# Patient Record
Sex: Female | Born: 1995 | Race: Black or African American | Hispanic: No | Marital: Single | Smoking: Never smoker
Health system: Southern US, Community
[De-identification: ages and names within clinical notes are randomized; demographics above are authoritative.]

## PROBLEM LIST (undated history)

## (undated) DIAGNOSIS — J36 Peritonsillar abscess: Secondary | ICD-10-CM

## (undated) DIAGNOSIS — Z789 Other specified health status: Secondary | ICD-10-CM

## (undated) HISTORY — PX: INCISION AND DRAINAGE OF PERITONSILLAR ABCESS: SHX6257

---
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
## 2016-06-11 ENCOUNTER — Ambulatory Visit (INDEPENDENT_AMBULATORY_CARE_PROVIDER_SITE_OTHER): Payer: Medicaid Other | Admitting: Physician Assistant

## 2016-06-11 ENCOUNTER — Encounter (INDEPENDENT_AMBULATORY_CARE_PROVIDER_SITE_OTHER): Payer: Self-pay | Admitting: Physician Assistant

## 2016-06-11 VITALS — BP 132/83 | HR 87 | Temp 98.0°F | Ht 68.0 in | Wt 281.2 lb

## 2016-06-11 DIAGNOSIS — Z0000 Encounter for general adult medical examination without abnormal findings: Secondary | ICD-10-CM | POA: Diagnosis not present

## 2016-06-11 NOTE — Progress Notes (Signed)
   Subjective:  Patient ID: Leslie [name], female    DOB: [birth year]  Age: 21 y.o. MRN: [id number]  CC:  Physical exam  HPI Leslie [name] is a 21 y.o. female with no significant PMH presents for a physical exam. She has concern for hypertension due to strong family history of hypertension. Has not had any cardiovascular symptoms but she does not exercise or diet. Declines Pap smear today and will return at another time for the PAP smear. Previous pap smears have been normal according to patient. Needs TDAP. Only ailment has been left patellar dislocation. Denies any other symptoms.    ROS Review of Systems  Constitutional: Negative for chills, fever and malaise/fatigue.  Eyes: Negative for blurred vision.  Respiratory: Negative for shortness of breath.   Cardiovascular: Negative for chest pain and palpitations.  Gastrointestinal: Negative for abdominal pain and nausea.  Genitourinary: Negative for dysuria and hematuria.  Musculoskeletal: Positive for joint pain (hx of left patellar dislocation). Negative for myalgias.  Skin: Negative for rash.  Neurological: Negative for tingling and headaches.  Psychiatric/Behavioral: Negative for depression. The patient is not nervous/anxious.     Objective:  BP 132/83   Pulse 87   Temp 98 F (36.7 C) (Oral)   Ht 5\' 8"  (1.727 m)   Wt 281 lb 3.2 oz (127.6 kg)   LMP  (Exact Date)   SpO2 95%   BMI 42.76 kg/m   BP/Weight [id number]  Systolic BP [id number]  Diastolic BP 83  Wt. (Lbs) 281.2  BMI 42.76      Physical Exam  Constitutional: She is oriented to person, place, and time.  Obese, NAD, polite  HENT:  Head: Normocephalic and atraumatic.  Eyes: Conjunctivae are normal. Pupils are equal, round, and reactive to light. No scleral icterus.  Neck: Normal range of motion. Neck supple. No thyromegaly present.  Cardiovascular: Normal rate, regular rhythm and normal heart sounds.   Pulmonary/Chest: Effort normal and breath sounds normal.  Abdominal:  Soft. Bowel sounds are normal. There is no tenderness.  Musculoskeletal: She exhibits no edema or deformity.  Patellar DTR 1+ bilaterally. Knees without crepitus or TTP.  Neurological: She is alert and oriented to person, place, and time. No cranial nerve deficit. She exhibits normal muscle tone. Coordination normal.  Skin: Skin is warm and dry. No rash noted. No erythema. No pallor.  Psychiatric: She has a normal mood and affect. Her behavior is normal. Thought content normal.  Vitals reviewed.    Assessment & Plan:   1. Physical exam -Pap smear declined today. No TDAP available in clinic today. Shipment expected in one week. Pt will follow up for Pap and TDAP will likely be administered at that time.  - CBC with Differential/Platelet - Comprehensive metabolic panel - Lipid panel   Follow-up: Return in about 2 weeks (around 06/25/2016) for PAP and TDAP.   Clent Demark PA

## 2016-06-11 NOTE — Patient Instructions (Signed)

## 2016-06-12 ENCOUNTER — Other Ambulatory Visit (INDEPENDENT_AMBULATORY_CARE_PROVIDER_SITE_OTHER): Payer: Self-pay | Admitting: Physician Assistant

## 2016-06-12 DIAGNOSIS — E7800 Pure hypercholesterolemia, unspecified: Secondary | ICD-10-CM

## 2016-06-12 LAB — COMPREHENSIVE METABOLIC PANEL
A/G RATIO: 1.3 (ref 1.2–2.2)
ALT: 30 IU/L (ref 0–32)
AST: 16 IU/L (ref 0–40)
Albumin: 4 g/dL (ref 3.5–5.5)
Alkaline Phosphatase: 107 IU/L (ref 39–117)
BILIRUBIN TOTAL: 0.5 mg/dL (ref 0.0–1.2)
BUN/Creatinine Ratio: 14 (ref 9–23)
BUN: 10 mg/dL (ref 6–20)
CO2: 27 mmol/L (ref 18–29)
Calcium: 9.5 mg/dL (ref 8.7–10.2)
Chloride: 101 mmol/L (ref 96–106)
Creatinine, Ser: 0.74 mg/dL (ref 0.57–1.00)
GFR calc non Af Amer: 115 mL/min/{1.73_m2} (ref >59)
GFR, EST AFRICAN AMERICAN: 132 mL/min/{1.73_m2} (ref >59)
GLOBULIN, TOTAL: 3.2 g/dL (ref 1.5–4.5)
Glucose: 99 mg/dL (ref 65–99)
POTASSIUM: 4.3 mmol/L (ref 3.5–5.2)
SODIUM: 138 mmol/L (ref 134–144)
TOTAL PROTEIN: 7.2 g/dL (ref 6.0–8.5)

## 2016-06-12 LAB — LIPID PANEL
CHOL/HDL RATIO: 4.3 ratio (ref 0.0–4.4)
Cholesterol, Total: 162 mg/dL (ref 100–199)
HDL: 38 mg/dL — ABNORMAL LOW (ref >39)
LDL Calculated: 113 mg/dL — ABNORMAL HIGH (ref 0–99)
Triglycerides: 54 mg/dL (ref 0–149)
VLDL Cholesterol Cal: 11 mg/dL (ref 5–40)

## 2016-06-12 LAB — CBC WITH DIFFERENTIAL/PLATELET
BASOS: 0 %
Basophils Absolute: 0 10*3/uL (ref 0.0–0.2)
EOS (ABSOLUTE): 0.1 10*3/uL (ref 0.0–0.4)
EOS: 1 %
HEMATOCRIT: 41 % (ref 34.0–46.6)
Hemoglobin: 13.2 g/dL (ref 11.1–15.9)
IMMATURE GRANULOCYTES: 0 %
Immature Grans (Abs): 0 10*3/uL (ref 0.0–0.1)
Lymphocytes Absolute: 2.2 10*3/uL (ref 0.7–3.1)
Lymphs: 36 %
MCH: 28 pg (ref 26.6–33.0)
MCHC: 32.2 g/dL (ref 31.5–35.7)
MCV: 87 fL (ref 79–97)
MONOS ABS: 0.3 10*3/uL (ref 0.1–0.9)
Monocytes: 6 %
NEUTROS ABS: 3.5 10*3/uL (ref 1.4–7.0)
NEUTROS PCT: 57 %
Platelets: 290 10*3/uL (ref 150–379)
RBC: 4.71 x10E6/uL (ref 3.77–5.28)
RDW: 14.5 % (ref 12.3–15.4)
WBC: 6.1 10*3/uL (ref 3.4–10.8)

## 2016-09-06 ENCOUNTER — Encounter (INDEPENDENT_AMBULATORY_CARE_PROVIDER_SITE_OTHER): Payer: Self-pay | Admitting: Physician Assistant

## 2016-09-06 ENCOUNTER — Ambulatory Visit (INDEPENDENT_AMBULATORY_CARE_PROVIDER_SITE_OTHER): Payer: Medicaid Other | Admitting: Physician Assistant

## 2016-09-06 VITALS — BP 134/81 | HR 69 | Temp 97.9°F | Wt 273.2 lb

## 2016-09-06 DIAGNOSIS — H1033 Unspecified acute conjunctivitis, bilateral: Secondary | ICD-10-CM | POA: Diagnosis not present

## 2016-09-06 DIAGNOSIS — J029 Acute pharyngitis, unspecified: Secondary | ICD-10-CM | POA: Diagnosis not present

## 2016-09-06 LAB — POCT RAPID STREP A (OFFICE): Rapid Strep A Screen: NEGATIVE

## 2016-09-06 MED ORDER — NAPROXEN 500 MG PO TABS: 500.0000 mg | ORAL_TABLET | Freq: Two times a day (BID) | ORAL | 0 refills | Status: DC

## 2016-09-06 MED ORDER — TOBRAMYCIN 0.3 % OP SOLN: 2.0000 [drp] | OPHTHALMIC | 0 refills | Status: AC

## 2016-09-06 NOTE — Patient Instructions (Addendum)
Bacterial Conjunctivitis Bacterial conjunctivitis is an infection of the clear membrane that covers the white part of your eye and the inner surface of your eyelid (conjunctiva). When the blood vessels in your conjunctiva become inflamed, your eye becomes red or pink, and it will probably feel itchy. Bacterial conjunctivitis spreads very easily from person to person (is contagious). It also spreads easily from one eye to the other eye. What are the causes? This condition is caused by several common bacteria. You may get the infection if you come into close contact with another person who is infected. You may also come into contact with items that are contaminated with the bacteria, such as a face towel, contact lens solution, or eye makeup. What increases the risk? This condition is more likely to develop in people who:  Are exposed to other people who have the infection.  Wear contact lenses.  Have a sinus infection.  Have had a recent eye injury or surgery.  Have a weak body defense system (immune system).  Have a medical condition that causes dry eyes.  What are the signs or symptoms? Symptoms of this condition include:  Eye redness.  Tearing or watery eyes.  Itchy eyes.  Burning feeling in your eyes.  Thick, yellowish discharge from an eye. This may turn into a crust on the eyelid overnight and cause your eyelids to stick together.  Swollen eyelids.  Blurred vision.  How is this diagnosed? Your health care provider can diagnose this condition based on your symptoms and medical history. Your health care provider may also take a sample of discharge from your eye to find the cause of your infection. This is rarely done. How is this treated? Treatment for this condition includes:  Antibiotic eye drops or ointment to clear the infection more quickly and prevent the spread of infection to others.  Oral antibiotic medicines to treat infections that do not respond to drops or  ointments, or last longer than 10 days.  Cool, wet cloths (cool compresses) placed on the eyes.  Artificial tears applied 2-6 times a day.  Follow these instructions at home: Medicines  Take or apply your antibiotic medicine as told by your health care provider. Do not stop taking or applying the antibiotic even if you start to feel better.  Take or apply over-the-counter and prescription medicines only as told by your health care provider.  Be very careful to avoid touching the edge of your eyelid with the eye drop bottle or the ointment tube when you apply medicines to the affected eye. This will keep you from spreading the infection to your other eye or to other people. Managing discomfort  Gently wipe away any drainage from your eye with a warm, wet washcloth or a cotton ball.  Apply a cool, clean washcloth to your eye for 10-20 minutes, 3-4 times a day. General instructions  Do not wear contact lenses until the inflammation is gone and your health care provider says it is safe to wear them again. Ask your health care provider how to sterilize or replace your contact lenses before you use them again. Wear glasses until you can resume wearing contacts.  Avoid wearing eye makeup until the inflammation is gone. Throw away any old eye cosmetics that may be contaminated.  Change or wash your pillowcase every day.  Do not share towels or washcloths. This may spread the infection.  Wash your hands often with soap and water. Use paper towels to dry your hands.  Avoid   touching or rubbing your eyes.  Do not drive or use heavy machinery if your vision is blurred. Contact a health care provider if:  You have a fever.  Your symptoms do not get better after 10 days. Get help right away if:  You have a fever and your symptoms suddenly get worse.  You have severe pain when you move your eye.  You have facial pain, redness, or swelling.  You have sudden loss of vision. This  information is not intended to replace advice given to you by your health care provider. Make sure you discuss any questions you have with your health care provider. Document Released: 03/26/2005 Document Revised: 08/04/2015 Document Reviewed: 01/06/2015 Elsevier Interactive Patient Education  2017 [employer]  Sore Throat A sore throat is pain, burning, irritation, or scratchiness in the throat. When you have a sore throat, you may feel pain or tenderness in your throat when you swallow or talk. Many things can cause a sore throat, including:  An infection.  Seasonal allergies.  Dryness in the air.  Irritants, such as smoke or pollution.  Gastroesophageal reflux disease (GERD).  A tumor.  A sore throat is often the first sign of another sickness. It may happen with other symptoms, such as coughing, sneezing, fever, and swollen neck glands. Most sore throats go away without medical treatment. Follow these instructions at home:  Take over-the-counter medicines only as told by your health care provider.  Drink enough fluids to keep your urine clear or pale yellow.  Rest as needed.  To help with pain, try: ? Sipping warm liquids, such as broth, herbal tea, or warm water. ? Eating or drinking cold or frozen liquids, such as frozen ice pops. ? Gargling with a salt-water mixture 3-4 times a day or as needed. To make a salt-water mixture, completely dissolve -1 tsp of salt in 1 cup of warm water. ? Sucking on hard candy or throat lozenges. ? Putting a cool-mist humidifier in your bedroom at night to moisten the air. ? Sitting in the bathroom with the door closed for 5-10 minutes while you run hot water in the shower.  Do not use any tobacco products, such as cigarettes, chewing tobacco, and e-cigarettes. If you need help quitting, ask your health care provider. Contact a health care provider if:  You have a fever for more than 2-3 days.  You have symptoms that last (are  persistent) for more than 2-3 days.  Your throat does not get better within 7 days.  You have a fever and your symptoms suddenly get worse. Get help right away if:  You have difficulty breathing.  You cannot swallow fluids, soft foods, or your saliva.  You have increased swelling in your throat or neck.  You have persistent nausea and vomiting. This information is not intended to replace advice given to you by your health care provider. Make sure you discuss any questions you have with your health care provider. Document Released: 05/03/2004 Document Revised: 11/20/2015 Document Reviewed: 01/14/2015 Elsevier Interactive Patient Education  [employer].

## 2016-09-06 NOTE — Progress Notes (Signed)
Subjective:  Patient ID: Leslie [name], female    DOB: 1995-10-01  Age: 21 y.o. MRN: [id number]  CC: conjunctivitis  HPI Wylie Russon is a 21 y.o. female with a PMH of HLD presents with right eye redness and swelling since yesterday. Left eye beginning to have similar symptoms. She also has a mildly sore throat. Her child was sick with strep throat last week. No f/c/n/v, rash, chest pain, SOB, or headache.     ROS Review of Systems  Constitutional: Negative for chills, fever and malaise/fatigue.  HENT: Positive for sore throat.   Eyes: Positive for discharge and redness. Negative for blurred vision, double vision, photophobia and pain.  Respiratory: Negative for shortness of breath.   Cardiovascular: Negative for chest pain and palpitations.  Gastrointestinal: Negative for abdominal pain and nausea.  Genitourinary: Negative for dysuria and hematuria.  Musculoskeletal: Negative for joint pain and myalgias.  Skin: Negative for rash.  Neurological: Negative for tingling and headaches.  Psychiatric/Behavioral: Negative for depression. The patient is not nervous/anxious.     Objective:  BP 134/81 (BP Location: Right Arm, Patient Position: Sitting, Cuff Size: Large)   Pulse 69   Temp 97.9 F (36.6 C) (Oral)   Wt 273 lb 3.2 oz (123.9 kg)   SpO2 100%   BMI 41.54 kg/m   BP/Weight 07/16/8117 04/13/7827  Systolic BP 562 130  Diastolic BP 81 83  Wt. (Lbs) 273.2 281.2  BMI 41.54 42.76      Physical Exam  Constitutional: She is oriented to person, place, and time.  Well developed, obese, NAD, polite  HENT:  Head: Normocephalic and atraumatic.  Tonsils 2+ and erythematous without exudates. TMs normal.   Eyes: No scleral icterus.  Right eye with minimal palpebral swelling, minimal scleral erythema. Left eye normal.  Neck: Normal range of motion. Neck supple.  Cardiovascular: Normal rate, regular rhythm and normal heart sounds.   Pulmonary/Chest: Effort normal and breath sounds  normal.  Musculoskeletal: She exhibits no edema.  Lymphadenopathy:    She has no cervical adenopathy.  Neurological: She is alert and oriented to person, place, and time. No cranial nerve deficit. Coordination normal.  Skin: Skin is warm and dry. No rash noted. No erythema. No pallor.  Psychiatric: She has a normal mood and affect. Her behavior is normal. Thought content normal.  Vitals reviewed.    Assessment & Plan:   1. Acute bacterial conjunctivitis of both eyes - Begin tobramycin (TOBREX) 0.3 % ophthalmic solution; Place 2 drops into both eyes every 4 (four) hours.  Dispense: 5 mL; Refill: 0  2. Sore throat - Rapid Strep A negative in clinic today - Begin naproxen (NAPROSYN) 500 MG tablet; Take 1 tablet (500 mg total) by mouth 2 (two) times daily with a meal.  Dispense: 14 tablet; Refill: 0   Meds ordered this encounter  Medications  . tobramycin (TOBREX) 0.3 % ophthalmic solution    Sig: Place 2 drops into both eyes every 4 (four) hours.    Dispense:  5 mL    Refill:  0    Order Specific Question:   Supervising Provider    Answer:   Tresa Garter [id number]  . naproxen (NAPROSYN) 500 MG tablet    Sig: Take 1 tablet (500 mg total) by mouth 2 (two) times daily with a meal.    Dispense:  14 tablet    Refill:  0    Order Specific Question:   Supervising Provider    Answer:   Tresa Garter [  [id number]]    Follow-up: Return if symptoms worsen or fail to improve.   Clent Demark PA

## 2016-12-02 ENCOUNTER — Emergency Department (HOSPITAL_COMMUNITY)
Admission: EM | Admit: 2016-12-02 | Discharge: 2016-12-02 | Disposition: A | Discharge status: Home / Self Care | Payer: Medicaid Other | Attending: Emergency Medicine | Admitting: Emergency Medicine

## 2016-12-02 ENCOUNTER — Encounter (HOSPITAL_COMMUNITY): Payer: Self-pay | Admitting: Emergency Medicine

## 2016-12-02 DIAGNOSIS — J36 Peritonsillar abscess: Secondary | ICD-10-CM | POA: Diagnosis not present

## 2016-12-02 DIAGNOSIS — R070 Pain in throat: Secondary | ICD-10-CM | POA: Diagnosis present

## 2016-12-02 DIAGNOSIS — Z791 Long term (current) use of non-steroidal anti-inflammatories (NSAID): Secondary | ICD-10-CM | POA: Insufficient documentation

## 2016-12-02 LAB — CBC WITH DIFFERENTIAL/PLATELET
Basophils Absolute: 0 10*3/uL (ref 0.0–0.1)
Basophils Relative: 0 %
EOS ABS: 0 10*3/uL (ref 0.0–0.7)
Eosinophils Relative: 0 %
HCT: 38.2 % (ref 36.0–46.0)
HEMOGLOBIN: 12.3 g/dL (ref 12.0–15.0)
LYMPHS ABS: 1.9 10*3/uL (ref 0.7–4.0)
Lymphocytes Relative: 20 %
MCH: 27.8 pg (ref 26.0–34.0)
MCHC: 32.2 g/dL (ref 30.0–36.0)
MCV: 86.2 fL (ref 78.0–100.0)
MONOS PCT: 6 %
Monocytes Absolute: 0.6 10*3/uL (ref 0.1–1.0)
Neutro Abs: 7.1 10*3/uL (ref 1.7–7.7)
Neutrophils Relative %: 74 %
Platelets: 296 10*3/uL (ref 150–400)
RBC: 4.43 MIL/uL (ref 3.87–5.11)
RDW: 14.5 % (ref 11.5–15.5)
WBC: 9.6 10*3/uL (ref 4.0–10.5)

## 2016-12-02 LAB — BASIC METABOLIC PANEL
Anion gap: 8 (ref 5–15)
BUN: 8 mg/dL (ref 6–20)
CHLORIDE: 103 mmol/L (ref 101–111)
CO2: 24 mmol/L (ref 22–32)
CREATININE: 0.75 mg/dL (ref 0.44–1.00)
Calcium: 9.1 mg/dL (ref 8.9–10.3)
GFR calc Af Amer: >60 mL/min (ref >60)
GFR calc non Af Amer: >60 mL/min (ref >60)
GLUCOSE: 105 mg/dL — AB (ref 65–99)
Potassium: 3.9 mmol/L (ref 3.5–5.1)
SODIUM: 135 mmol/L (ref 135–145)

## 2016-12-02 LAB — RAPID STREP SCREEN (MED CTR MEBANE ONLY): Streptococcus, Group A Screen (Direct): NEGATIVE

## 2016-12-02 LAB — I-STAT BETA HCG BLOOD, ED (MC, WL, AP ONLY)

## 2016-12-02 MED ORDER — CLINDAMYCIN PHOSPHATE 600 MG/50ML IV SOLN
600.0000 mg | Freq: Once | INTRAVENOUS | Status: AC
  Administered 2016-12-02: 600 mg via INTRAVENOUS
  Filled 2016-12-02: qty 50

## 2016-12-02 MED ORDER — DEXAMETHASONE SODIUM PHOSPHATE 10 MG/ML IJ SOLN
10.0000 mg | Freq: Once | INTRAMUSCULAR | Status: AC
  Administered 2016-12-02: 10 mg via INTRAVENOUS
  Filled 2016-12-02: qty 1

## 2016-12-02 MED ORDER — SODIUM CHLORIDE 0.9 % IV BOLUS (SEPSIS)
1000.0000 mL | Freq: Once | INTRAVENOUS | Status: AC
  Administered 2016-12-02: 1000 mL via INTRAVENOUS

## 2016-12-02 MED ORDER — KETOROLAC TROMETHAMINE 30 MG/ML IJ SOLN
30.0000 mg | Freq: Once | INTRAMUSCULAR | Status: AC
  Administered 2016-12-02: 30 mg via INTRAVENOUS
  Filled 2016-12-02: qty 1

## 2016-12-02 MED ORDER — ONDANSETRON 4 MG PO TBDP: 4.0000 mg | ORAL_TABLET | Freq: Three times a day (TID) | ORAL | 0 refills | Status: DC | PRN

## 2016-12-02 MED ORDER — CLINDAMYCIN HCL 300 MG PO CAPS: 300.0000 mg | ORAL_CAPSULE | Freq: Four times a day (QID) | ORAL | 0 refills | Status: DC

## 2016-12-02 NOTE — ED Notes (Signed)
Flank pain complaints documented in error.  Would not allow me to change.  Patient is here for swelling of tonsil.

## 2016-12-02 NOTE — ED Provider Notes (Signed)
TIME SEEN: 5:58 AM  CHIEF COMPLAINT: Right-sided sore throat  HPI: Patient is a 21 year old female with no significant past medical history who presents to the emergency department with sore throat for the past several days. It is mostly right-sided. She feel she is having a difficult time swallowing because of pain and has had some voice changes. Reports low-grade fevers. No vomiting. No cough. No rash. No neck pain or neck stiffness.  ROS: See HPI Constitutional: no fever  Eyes: no drainage  ENT: no runny nose   Cardiovascular:  no chest pain  Resp: no SOB  GI: no vomiting GU: no dysuria Integumentary: no rash  Allergy: no hives  Musculoskeletal: no leg swelling  Neurological: no slurred speech ROS otherwise negative  PAST MEDICAL HISTORY/PAST SURGICAL HISTORY:  History reviewed. No pertinent past medical history.  MEDICATIONS:  Prior to Admission medications   Medication Sig Start Date End Date Taking? Authorizing Provider  naproxen (NAPROSYN) 500 MG tablet Take 1 tablet (500 mg total) by mouth 2 (two) times daily with a meal. 09/06/16   Clent Demark, PA-C    ALLERGIES:  No Active Allergies  SOCIAL HISTORY:  Social History  Substance Use Topics  . Smoking status: Never Smoker  . Smokeless tobacco: Never Used  . Alcohol use No    FAMILY HISTORY: No family history on file.  EXAM: BP (!) 150/96 (BP Location: Right Arm)   Pulse 100   Temp 99.1 F (37.3 C) (Oral)   Resp 18   Ht 5\' 8"  (1.727 m)   Wt 117.9 kg (260 lb)   SpO2 95%   BMI 39.53 kg/m  CONSTITUTIONAL: Alert and oriented and responds appropriately to questions. Well-appearing; well-nourished HEAD: Normocephalic EYES: Conjunctivae clear, pupils appear equal, EOMI ENT: normal nose; moist mucous membranes; patient does have posterior pharyngeal erythema and right-sided tonsillar hypertrophy without exudate, patient does have uvular deviation to the left side, slightly muffled voice, no trismus or  drooling, no stridor, no dental caries present, no drainable dental abscess noted, no Ludwig's angina, tongue sits flat in the bottom of the mouth, no angioedema, no facial erythema or warmth, no facial swelling; no pain with movement of the neck. NECK: Supple, no meningismus, no nuchal rigidity, no LAD  CARD: RRR; S1 and S2 appreciated; no murmurs, no clicks, no rubs, no gallops RESP: Normal chest excursion without splinting or tachypnea; breath sounds clear and equal bilaterally; no wheezes, no rhonchi, no rales, no hypoxia or respiratory distress, speaking full sentences ABD/GI: Normal bowel sounds; non-distended; soft, non-tender, no rebound, no guarding, no peritoneal signs, no hepatosplenomegaly BACK:  The back appears normal and is non-tender to palpation, there is no CVA tenderness EXT: Normal ROM in all joints; non-tender to palpation; no edema; normal capillary refill; no cyanosis, no calf tenderness or swelling    SKIN: Normal color for age and race; warm; no rash NEURO: Moves all extremities equally PSYCH: The patient's mood and manner are appropriate. Grooming and personal hygiene are appropriate.  MEDICAL DECISION MAKING: Patient here with what appears to be a right peritonsillar abscess clinically. She does have a mildly muffled voice but no stridor, trismus or drooling. No hypoxia. Plan is to give IV fluids, Toradol, Decadron, clindamycin. We'll obtain labs.  ED PROGRESS: Patient reports feeling better. Her labs are unremarkable. Strep test negative. Will discuss with ENT on call.  7:15 AM  D/w Dr. Constance Holster with ENT. Appreciate his help. He agrees that patient can be followed as an outpatient. She  is very stable here and again reports feeling better. No sepsis. We did discuss at length return cautions the patient appears very reliable. She does have a primary care physician for follow-up. We'll discharge with prescription for clindamycin and Zofran. I recommend alternating Tylenol and  Motrin.   At this time, I do not feel there is any life-threatening condition present. I have reviewed and discussed all results (EKG, imaging, lab, urine as appropriate) and exam findings with patient/family. I have reviewed nursing notes and appropriate previous records.  I feel the patient is safe to be discharged home without further emergent workup and can continue workup as an outpatient as needed. Discussed usual and customary return precautions. Patient/family verbalize understanding and are comfortable with this plan.  Outpatient follow-up has been provided if needed. All questions have been answered.    Pamila Mendibles, Delice Bison, DO 12/02/16 [phone]

## 2016-12-02 NOTE — ED Triage Notes (Signed)
Reports throat being a little sore on right side yesterday.  Woke up with swelling this morning.  Reports feels like something is stuck in tonsil.

## 2016-12-02 NOTE — Discharge Instructions (Signed)
You may alternate Tylenol 1000 mg every 6 hours as needed for pain and Ibuprofen 800 mg every 8 hours as needed for pain.  Please take Ibuprofen with food. ° °

## 2016-12-03 ENCOUNTER — Emergency Department (HOSPITAL_COMMUNITY)
Admission: EM | Admit: 2016-12-03 | Discharge: 2016-12-04 | Disposition: A | Discharge status: Home / Self Care | Payer: Medicaid Other | Attending: Emergency Medicine | Admitting: Emergency Medicine

## 2016-12-03 ENCOUNTER — Encounter (HOSPITAL_COMMUNITY): Payer: Self-pay | Admitting: Emergency Medicine

## 2016-12-03 DIAGNOSIS — J36 Peritonsillar abscess: Secondary | ICD-10-CM | POA: Diagnosis not present

## 2016-12-03 LAB — BASIC METABOLIC PANEL
Anion gap: 8 (ref 5–15)
BUN: 10 mg/dL (ref 6–20)
CALCIUM: 9 mg/dL (ref 8.9–10.3)
CO2: 24 mmol/L (ref 22–32)
CREATININE: 0.87 mg/dL (ref 0.44–1.00)
Chloride: 104 mmol/L (ref 101–111)
GFR calc non Af Amer: >60 mL/min (ref >60)
Glucose, Bld: 104 mg/dL — ABNORMAL HIGH (ref 65–99)
Potassium: 3.3 mmol/L — ABNORMAL LOW (ref 3.5–5.1)
Sodium: 136 mmol/L (ref 135–145)

## 2016-12-03 LAB — CBC WITH DIFFERENTIAL/PLATELET
BASOS PCT: 0 %
Basophils Absolute: 0 10*3/uL (ref 0.0–0.1)
EOS ABS: 0 10*3/uL (ref 0.0–0.7)
EOS PCT: 0 %
HEMATOCRIT: 36.6 % (ref 36.0–46.0)
Hemoglobin: 11.6 g/dL — ABNORMAL LOW (ref 12.0–15.0)
Lymphocytes Relative: 26 %
Lymphs Abs: 2.6 10*3/uL (ref 0.7–4.0)
MCH: 27.3 pg (ref 26.0–34.0)
MCHC: 31.7 g/dL (ref 30.0–36.0)
MCV: 86.1 fL (ref 78.0–100.0)
MONO ABS: 0.7 10*3/uL (ref 0.1–1.0)
MONOS PCT: 7 %
NEUTROS ABS: 6.9 10*3/uL (ref 1.7–7.7)
Neutrophils Relative %: 67 %
PLATELETS: 292 10*3/uL (ref 150–400)
RBC: 4.25 MIL/uL (ref 3.87–5.11)
RDW: 14.7 % (ref 11.5–15.5)
WBC: 10.3 10*3/uL (ref 4.0–10.5)

## 2016-12-03 LAB — I-STAT CG4 LACTIC ACID, ED: Lactic Acid, Venous: 0.8 mmol/L (ref 0.5–1.9)

## 2016-12-03 MED ORDER — SODIUM CHLORIDE 0.9 % IV BOLUS (SEPSIS): 1000.0000 mL | Freq: Once | INTRAVENOUS | Status: DC

## 2016-12-03 MED ORDER — SODIUM CHLORIDE 0.9 % IV BOLUS (SEPSIS)
1000.0000 mL | Freq: Once | INTRAVENOUS | Status: AC
  Administered 2016-12-03: 1000 mL via INTRAVENOUS

## 2016-12-03 MED ORDER — MORPHINE SULFATE (PF) 4 MG/ML IV SOLN
4.0000 mg | Freq: Once | INTRAVENOUS | Status: AC
  Administered 2016-12-03: 4 mg via INTRAVENOUS
  Filled 2016-12-03: qty 1

## 2016-12-03 MED ORDER — DEXAMETHASONE SODIUM PHOSPHATE 10 MG/ML IJ SOLN
10.0000 mg | Freq: Once | INTRAMUSCULAR | Status: AC
  Administered 2016-12-03: 10 mg via INTRAVENOUS
  Filled 2016-12-03: qty 1

## 2016-12-03 MED ORDER — LIDOCAINE-EPINEPHRINE (PF) 2 %-1:200000 IJ SOLN
10.0000 mL | Freq: Once | INTRAMUSCULAR | Status: AC
  Administered 2016-12-03: 10 mL
  Filled 2016-12-03: qty 20

## 2016-12-03 MED ORDER — ONDANSETRON HCL 4 MG/2ML IJ SOLN
4.0000 mg | Freq: Once | INTRAMUSCULAR | Status: AC
  Administered 2016-12-03: 4 mg via INTRAVENOUS
  Filled 2016-12-03: qty 2

## 2016-12-03 MED ORDER — CLINDAMYCIN PHOSPHATE 600 MG/50ML IV SOLN
600.0000 mg | Freq: Once | INTRAVENOUS | Status: AC
  Administered 2016-12-03: 600 mg via INTRAVENOUS
  Filled 2016-12-03: qty 50

## 2016-12-03 NOTE — ED Provider Notes (Signed)
Medical screening examination/treatment/procedure(s) were conducted as a shared visit with non-physician practitioner(s) and myself.  I personally evaluated the patient during the encounter.   EKG Interpretation None     Patient was diagnosed with peritonsillar abscess. It was improving. She was seen this morning in ENT. She reports that suddenly started becoming much worse this afternoon. She reports that it's very uncomfortable and makes it hard to swallow. No difficulty breathing Patient is alert and nontoxic. Oral examination shows significantly enlarged tonsillar area on the right. Uvula slightly displaced. Airways however patent with no respiratory distress. Neck is supple. I agree with plan of management.   Charlesetta Shanks, MD 12/03/16 [phone]

## 2016-12-03 NOTE — ED Triage Notes (Signed)
Pt states she came yesterday for an abscess on her tong and was sent home with PO ABx, today her mouth is swollen, having difficulty to swallow and some SOB with it, pain is increasing 10/10, having fever last night not today, pt taking her medications as prescribed but getting worse.

## 2016-12-03 NOTE — ED Provider Notes (Signed)
[facility] DEPT Provider Note   CSN: [id number] Arrival date & time: 12/03/16  2026     History   Chief Complaint Chief Complaint  Patient presents with  . Abscess    HPI Leslie [name] is a 21 y.o. female.  Patient presents with complaint of worsening sore throat, difficulty swallowing. Patient was seen in emergency department early yesterday morning and was diagnosed with a right-sided peritonsillar abscess. She was treated with steroids, clindamycin. Case was discussed with ENT who agreed outpatient follow-up as appropriate. Patient saw ENT today. At that time, she states that she was feeling better. No procedures undertaken to drain abscess given improvement. However this evening, symptoms again worsened. She is having difficulty handling her secretions and reports severe sore throat. She reports feeling like she has a fever but no documented fevers. She is not in any respiratory distress. She has been compliant with clindamycin. The onset of this condition was acute. The course is worsening. Aggravating factors: none. Alleviating factors: none.        History reviewed. No pertinent past medical history.  Patient Active Problem List   Diagnosis Date Noted  . Pure hypercholesterolemia 06/12/2016    History reviewed. No pertinent surgical history.  OB History    No data available       Home Medications    Prior to Admission medications   Medication Sig Start Date End Date Taking? Authorizing Provider  clindamycin (CLEOCIN) 300 MG capsule Take 1 capsule (300 mg total) by mouth every 6 (six) hours. 12/02/16   Ward, Delice Bison, DO  naproxen (NAPROSYN) 500 MG tablet Take 1 tablet (500 mg total) by mouth 2 (two) times daily with a meal. 09/06/16   Clent Demark, PA-C  ondansetron (ZOFRAN ODT) 4 MG disintegrating tablet Take 1 tablet (4 mg total) by mouth every 8 (eight) hours as needed for nausea or vomiting. 12/02/16   Ward, Delice Bison, DO    Family History History  reviewed. No pertinent family history.  Social History Social History  Substance Use Topics  . Smoking status: Never Smoker  . Smokeless tobacco: Never Used  . Alcohol use No     Allergies   Patient has no active allergies.   Review of Systems Review of Systems  Constitutional: Positive for fever.  HENT: Positive for sore throat and trouble swallowing. Negative for rhinorrhea.   Eyes: Negative for redness.  Respiratory: Negative for cough.   Cardiovascular: Negative for chest pain.  Gastrointestinal: Negative for abdominal pain, diarrhea, nausea and vomiting.  Genitourinary: Negative for dysuria.  Musculoskeletal: Negative for myalgias.  Skin: Negative for rash.  Neurological: Negative for headaches.     Physical Exam Updated Vital Signs BP 116/66 (BP Location: Left Arm)   Pulse 95   Temp 98.5 F (36.9 C) (Oral)   Resp 18   Ht 5\' 8"  (1.727 m)   Wt 117.9 kg (260 lb)   SpO2 100%   BMI 39.53 kg/m   Physical Exam  Constitutional: She appears well-developed and well-nourished.  Patient not in distress however she is using a Yankauer to suction secretions.  HENT:  Head: Normocephalic and atraumatic.  Mouth/Throat: Posterior oropharyngeal edema, posterior oropharyngeal erythema and tonsillar abscesses present.    Eyes: Conjunctivae are normal. Right eye exhibits no discharge. Left eye exhibits no discharge.  Neck: Normal range of motion. Neck supple.  Cardiovascular: Normal rate, regular rhythm and normal heart sounds.   Pulmonary/Chest: Effort normal and breath sounds normal. No respiratory distress.  Abdominal:  Soft. There is no tenderness.  Neurological: She is alert.  Skin: Skin is warm and dry.  Psychiatric: She has a normal mood and affect.  Nursing note and vitals reviewed.    ED Treatments / Results  Labs (all labs ordered are listed, but only abnormal results are displayed) Labs Reviewed  CBC WITH DIFFERENTIAL/PLATELET - Abnormal; Notable for the  following:       Result Value   Hemoglobin 11.6 (*)    All other components within normal limits  BASIC METABOLIC PANEL - Abnormal; Notable for the following:    Potassium 3.3 (*)    Glucose, Bld 104 (*)    All other components within normal limits  I-STAT CG4 LACTIC ACID, ED  I-STAT CG4 LACTIC ACID, ED   Radiology No results found.  Procedures Procedures (including critical care time)  Medications Ordered in ED Medications  sodium chloride 0.9 % bolus 1,000 mL (not administered)  lidocaine-EPINEPHrine (XYLOCAINE W/EPI) 2 %-1:200000 (PF) injection 10 mL (not administered)  HYDROcodone-acetaminophen (HYCET) 7.5-325 mg/15 ml solution 15 mL (not administered)  dexamethasone (DECADRON) injection 10 mg (10 mg Intravenous Given 12/03/16 2147)  morphine 4 MG/ML injection 4 mg (4 mg Intravenous Given 12/03/16 2147)  ondansetron (ZOFRAN) injection 4 mg (4 mg Intravenous Given 12/03/16 2147)  clindamycin (CLEOCIN) IVPB 600 mg (0 mg Intravenous Stopped 12/03/16 2217)  sodium chloride 0.9 % bolus 1,000 mL (1,000 mLs Intravenous New Bag/Given 12/03/16 2220)  sodium chloride 0.9 % bolus 1,000 mL (1,000 mLs Intravenous New Bag/Given 12/03/16 2220)     Initial Impression / Assessment and Plan / ED Course  I have reviewed the triage vital signs and the nursing notes.  Pertinent labs & imaging results that were available during my care of the patient were reviewed by me and considered in my medical decision making (see chart for details).     Patient seen and examined. Medications ordered.   Vital signs reviewed and are as follows: BP (!) 163/88   Pulse 81   Temp 98.5 F (36.9 C) (Oral)   Resp 18   Ht 5\' 8"  (1.727 m)   Wt 117.9 kg (260 lb)   SpO2 100%   BMI 39.53 kg/m   Patient with obvious right peritonsillar abscess. I spoke with Dr. Wilburn Cornelia who recommends hydration, steroids, follow-up in office in the morning.  Patient seen in conjunction with Dr. Johnney Killian. After discussion with  patient, she will attempt aspiration.   Procedure note and discharge instructions by Dr. Johnney Killian.  Patient follow-up with Dr. Wilburn Cornelia in morning.  Final Clinical Impressions(s) / ED Diagnoses   Final diagnoses:  Peritonsillar abscess   Patient with peritonsillar abscess, worsening. No acute airway compromise. She has follow-up in the morning. She will continue clindamycin.  New Prescriptions New Prescriptions   HYDROCODONE-ACETAMINOPHEN (HYCET) 7.5-325 MG/15 ML SOLUTION    15 mL every 4-6 hours as needed for pain.     Carlisle Cater, PA-C 12/04/16 0134    Charlesetta Shanks, MD 12/05/16 [phone]

## 2016-12-04 LAB — CULTURE, GROUP A STREP (THRC)

## 2016-12-04 MED ORDER — HYDROCODONE-ACETAMINOPHEN 7.5-325 MG/15ML PO SOLN
15.0000 mL | Freq: Once | ORAL | Status: AC
  Administered 2016-12-04: 15 mL via ORAL
  Filled 2016-12-04: qty 15

## 2016-12-04 MED ORDER — HYDROCODONE-ACETAMINOPHEN 7.5-325 MG/15ML PO SOLN: ORAL | 0 refills | Status: DC

## 2016-12-04 NOTE — ED Provider Notes (Signed)
Medical screening examination/treatment/procedure(s) were conducted as a shared visit with non-physician practitioner(s) and myself.  I personally evaluated the patient during the encounter.   EKG Interpretation None     .Marland KitchenIncision and Drainage Date/Time: 12/04/2016 1:05 AM Performed by: Charlesetta Shanks Authorized by: Charlesetta Shanks   Consent:    Consent obtained:  Verbal   Consent given by:  Patient Location:    Type:  Abscess   Location:  Mouth   Mouth location:  Peritonsillar Pre-procedure details:    Skin preparation:  Antiseptic wash Anesthesia (see MAR for exact dosages):    Anesthesia method:  Local infiltration   Local anesthetic:  Lidocaine 2% WITH epi Procedure type:    Complexity:  Simple Procedure details:    Drainage:  Purulent   Drainage amount:  Moderate Post-procedure details:    Patient tolerance of procedure:  Tolerated well, no immediate complications Comments:     Peritonsillar abscess aspirated with 18-gauge needle for return of approximately 0.5 mL purulent material. Some continued drainage spontaneously. Patient gargled and felt improved. Postprocedure, airway is widely patent without any signs of impingement.  Patient is being seen after initial treatment for peritonsillar abscess with clindamycin. Patient had been seen by ENT this morning and was improving. Abruptly symptoms got worse with increased swelling this afternoon. Examination patient has full right peritonsillar abscess which has fluctuant area to palpation. Aspirated as outlined above. Patient has been treated with IV antibiotics, Decadron and rehydration with good response. Follow-up tomorrow morning with Dr. Wilburn Cornelia again.   Charlesetta Shanks, MD 12/04/16 [phone]

## 2016-12-04 NOTE — Discharge Instructions (Signed)
Continue your antibiotics. Take your next dose at 6 AM in the morning. Call Dr. Victorio Palm office to schedule a recheck tomorrow.

## 2016-12-05 ENCOUNTER — Encounter (HOSPITAL_COMMUNITY): Payer: Self-pay

## 2016-12-05 ENCOUNTER — Emergency Department (HOSPITAL_COMMUNITY)
Admission: EM | Admit: 2016-12-05 | Discharge: 2016-12-05 | Disposition: A | Discharge status: Home / Self Care | Payer: Medicaid Other | Attending: Emergency Medicine | Admitting: Emergency Medicine

## 2016-12-05 ENCOUNTER — Emergency Department (HOSPITAL_COMMUNITY): Payer: Medicaid Other

## 2016-12-05 DIAGNOSIS — J36 Peritonsillar abscess: Secondary | ICD-10-CM | POA: Insufficient documentation

## 2016-12-05 DIAGNOSIS — J029 Acute pharyngitis, unspecified: Secondary | ICD-10-CM | POA: Insufficient documentation

## 2016-12-05 DIAGNOSIS — M542 Cervicalgia: Secondary | ICD-10-CM | POA: Diagnosis present

## 2016-12-05 MED ORDER — METOCLOPRAMIDE HCL 5 MG/ML IJ SOLN
10.0000 mg | Freq: Once | INTRAMUSCULAR | Status: AC
  Administered 2016-12-05: 10 mg via INTRAVENOUS
  Filled 2016-12-05: qty 2

## 2016-12-05 MED ORDER — IOPAMIDOL (ISOVUE-300) INJECTION 61%
INTRAVENOUS | Status: AC
  Administered 2016-12-05: 75 mL via INTRAVENOUS
  Filled 2016-12-05: qty 75

## 2016-12-05 MED ORDER — ONDANSETRON 4 MG PO TBDP: 4.0000 mg | ORAL_TABLET | Freq: Three times a day (TID) | ORAL | 0 refills | Status: DC | PRN

## 2016-12-05 MED ORDER — CLINDAMYCIN HCL 150 MG PO CAPS
300.0000 mg | ORAL_CAPSULE | Freq: Once | ORAL | Status: AC
  Administered 2016-12-05: 300 mg via ORAL
  Filled 2016-12-05: qty 2

## 2016-12-05 MED ORDER — ONDANSETRON HCL 4 MG/2ML IJ SOLN
4.0000 mg | Freq: Once | INTRAMUSCULAR | Status: AC
  Administered 2016-12-05: 4 mg via INTRAVENOUS
  Filled 2016-12-05: qty 2

## 2016-12-05 MED ORDER — MORPHINE SULFATE (PF) 4 MG/ML IV SOLN
4.0000 mg | Freq: Once | INTRAVENOUS | Status: AC
  Administered 2016-12-05: 4 mg via INTRAVENOUS
  Filled 2016-12-05: qty 1

## 2016-12-05 MED ORDER — DEXAMETHASONE SODIUM PHOSPHATE 10 MG/ML IJ SOLN
10.0000 mg | Freq: Once | INTRAMUSCULAR | Status: AC
  Administered 2016-12-05: 10 mg via INTRAVENOUS
  Filled 2016-12-05: qty 1

## 2016-12-05 NOTE — ED Triage Notes (Signed)
Patient complains of ongoing sore throat and has been seen x 2 this week and diagnosed with strep and abscess that was drained, reports ongoing pain and nausea

## 2016-12-05 NOTE — ED Provider Notes (Signed)
[facility] DEPT Provider Note   CSN: [id number] Arrival date & time: 12/05/16  1646     History   Chief Complaint Chief Complaint  Patient presents with  . throat pain, nausea    HPI Leslie [name] is a 21 y.o. female who presents to the emergency department with chief complaint of right-sided neck pain and swelling.   She was seen in the ED on 8/26 for a right PTA. Dr. Constance Holster with ENT was consulted who recommended the patient f/u as an outpatient. The patient was started on Clindamycin. She was seen in the ENT office by Dr. Wilburn Cornelia on the morning of 8/27 who recommended continuing Clindamycin, cinreasing fluids, and return to the office for worsening symptoms. She went to the ED later in the evening on 8/27 for worsening sore throat and difficulty handling her secretions and the abscess was drained by Dr. Johnney Killian and a moderate amount of purulent material was drained. On 8/28, the patient followed up with Dr. Wilburn Cornelia in the ENT clinic for re-assessment and was encouraged to continue ABX, oral hydration, and ibuprofen.   She reports that she initially had difficulty breathing, but reports it has improved over the last few days. She reports right-sided neck pain that has rapidly worsened since this morning and a muffled voice that she reports began around 2:58 PM this afternoon. She also complains of nausea and reports little PO intake due to the pain.   The history is provided by the patient. No language interpreter was used.    History reviewed. No pertinent past medical history.  Patient Active Problem List   Diagnosis Date Noted  . Pure hypercholesterolemia 06/12/2016    History reviewed. No pertinent surgical history.  OB History    No data available       Home Medications    Prior to Admission medications   Medication Sig Start Date End Date Taking? Authorizing Provider  clindamycin (CLEOCIN) 300 MG capsule Take 1 capsule (300 mg total) by mouth every 6 (six)  hours. 12/02/16   Ward, Delice Bison, DO  HYDROcodone-acetaminophen (HYCET) 7.5-325 mg/15 ml solution 15 mL every 4-6 hours as needed for pain. 12/04/16   Charlesetta Shanks, MD  ibuprofen (ADVIL,MOTRIN) 200 MG tablet Take 600-800 mg by mouth every 6 (six) hours as needed for mild pain.    [provider]  levonorgestrel (MIRENA) 20 MCG/24HR IUD 1 each by Intrauterine route once.    [provider]  naproxen (NAPROSYN) 500 MG tablet Take 1 tablet (500 mg total) by mouth 2 (two) times daily with a meal. Patient not taking: Reported on 12/03/2016 09/06/16   Clent Demark, PA-C  ondansetron (ZOFRAN ODT) 4 MG disintegrating tablet Take 1 tablet (4 mg total) by mouth every 8 (eight) hours as needed for nausea or vomiting. 12/05/16   Toby Breithaupt A, PA-C    Family History No family history on file.  Social History Social History  Substance Use Topics  . Smoking status: Never Smoker  . Smokeless tobacco: Never Used  . Alcohol use No     Allergies   Patient has no known allergies.   Review of Systems Review of Systems  Constitutional: Negative for activity change and fever.  HENT: Positive for sore throat, trouble swallowing and voice change.   Respiratory: Negative for shortness of breath.   Cardiovascular: Negative for chest pain.  Gastrointestinal: Negative for abdominal pain.  Musculoskeletal: Negative for back pain.  Skin: Negative for rash.     Physical Exam  Updated Vital Signs BP (!) 160/76 (BP Location: Left Wrist)   Pulse 88   Temp 98.8 F (37.1 C) (Oral)   Resp 17   SpO2 100%   Physical Exam  Constitutional: No distress.  HENT:  Head: Normocephalic.  Mouth/Throat: There is trismus in the jaw. No uvula swelling. Tonsillar abscesses present.  2-finger trismus. The right tonsil is grossly enlarged. No respiratory distress. No increased work of breathing.  Eyes: Conjunctivae are normal.  Neck: Normal range of motion. No tracheal deviation present.    Mild Induration noted to the right anterolateral neck. The left side of the neck is soft. Normal right-sided neck exam. Diffuse cervical lymphadenopathy.  Cardiovascular: Normal rate, regular rhythm and normal heart sounds.  Exam reveals no gallop and no friction rub.   No murmur heard. Pulmonary/Chest: Effort normal and breath sounds normal. No stridor. No respiratory distress. She has no wheezes. She has no rales.  Abdominal: Soft. She exhibits no distension.  Lymphadenopathy:    She has cervical adenopathy.  Neurological: She is alert.  Skin: Skin is warm. No rash noted.  Psychiatric: Her behavior is normal.  Nursing note and vitals reviewed.  ED Treatments / Results  Labs (all labs ordered are listed, but only abnormal results are displayed) Labs Reviewed - No data to display  EKG  EKG Interpretation None       Radiology Ct Soft Tissue Neck W Contrast  Result Date: 12/05/2016 CLINICAL DATA:  21 y/o F; ongoing sore throat, diagnosed with strep, and post abscess drainage. EXAM: CT NECK WITH CONTRAST TECHNIQUE: Multidetector CT imaging of the neck was performed using the standard protocol following the bolus administration of intravenous contrast. CONTRAST:  42mL ISOVUE-300 IOPAMIDOL (ISOVUE-300) INJECTION 61% COMPARISON:  None. FINDINGS: Pharynx and larynx: Adenoid and right-greater-than-left palatine tonsil enlargement with mucosal thickening and enhancement of the right-greater-than-left oral and hypopharynx compatible with acute pharyngitis. Right palatine tonsil fluid collection measuring 24 x 24 x 24 mm (AP x ML x CC series 3, image 60 and series 6, image 49). No airway compromise. Mild inflammation in the right parapharyngeal space. Retropharyngeal fluid collection measuring 4 mm in thickness extending from the adenoid tonsils to the C5 level (series 7, image 57 and series 3, image 80). The fluid collection does not demonstrate peripheral enhancement. Salivary glands: No  inflammation, mass, or stone. Thyroid: Normal. Lymph nodes: Right-sided retropharyngeal, bilateral upper cervical, and submandibular lymphadenopathy is likely reactive. No lymph node necrosis. Vascular: Negative. Limited intracranial: Negative. Visualized orbits: Negative. Mastoids and visualized paranasal sinuses: Clear. Skeleton: No acute or aggressive process. Upper chest: Negative. Other: None. IMPRESSION: 1. Tonsillar enlargement and mucosal thickening of the oral and hypopharynx compatible with acute pharyngitis. 2. Right palatine abscess measuring up to 24 mm. No extension of the abscess into the surrounding deep cervical spaces. 3. Small retropharyngeal fluid collection without peripheral enhancement, probably a reactive effusion due to adenoid inflammation and retropharyngeal adenopathy, less likely abscess. 4. Upper cervical, submandibular, and retropharyngeal adenopathy is likely reactive. No lymph node necrosis. Electronically Signed   By: Kristine Garbe M.D.   On: 12/05/2016 20:28    Procedures Procedures (including critical care time)  Medications Ordered in ED Medications  morphine 4 MG/ML injection 4 mg (4 mg Intravenous Given 12/05/16 1802)  ondansetron (ZOFRAN) injection 4 mg (4 mg Intravenous Given 12/05/16 1807)  iopamidol (ISOVUE-300) 61 % injection (75 mLs Intravenous Contrast Given 12/05/16 1948)  metoCLOPramide (REGLAN) injection 10 mg (10 mg Intravenous Given 12/05/16 2035)  dexamethasone (  DECADRON) injection 10 mg (10 mg Intravenous Given 12/05/16 2139)  clindamycin (CLEOCIN) capsule 300 mg (300 mg Oral Given 12/05/16 2138)     Initial Impression / Assessment and Plan / ED Course  I have reviewed the triage vital signs and the nursing notes.  Pertinent labs & imaging results that were available during my care of the patient were reviewed by me and considered in my medical decision making (see chart for details).     21 year old female with a history of a right  PTA that was drained in the emergency department on 8/27 presenting with worsening right anterolateral neck pain and muffled voice. The patient was seen and evaluated with Dr. Roderic Palau, attending physician. No respiratory distress or increased work of breathing. The patient is having difficulty handling her secretions. Patient's labs were checked within the last 48 hours; will not repeat at this time. CT soft tissue of the neck revealing a 24 mm right palatine. Extension into the surrounding deep cervical spaces. There is a small retropharyngeal fluid collection without peripheral enhancement that is thought to be a reactive effusion due to adenoid inflammation and retropharyngeal adenopathy. Less likely thought to be an abscess. No necrosis of the lymph nodes, however, there is diffuse reactive lymphadenopathy. Consulted ENT and spoke with the on-call physician for [facility] who recommended giving the patient a repeat dose of Decadron and having her follow up in the outpatient clinic first thing tomorrow morning to have the abscess drained. The patient was given her evening dose of clindamycin in the ED. The patient was given strict return precautions to the emergency department if she develops any respiratory distress or difficulty breathing until she is seen in the morning. Encourage the patient to continue her clindamycin every 6 hours. The patient acknowledges and understands the plan at this time. No acute distress. Vital signs stable. Will discharge the patient to home at this time.  Final Clinical Impressions(s) / ED Diagnoses   Final diagnoses:  Tonsillar abscess    New Prescriptions Discharge Medication List as of 12/05/2016  9:30 PM       Joanne Gavel, PA-C 12/06/16 7989    Milton Ferguson, MD 12/06/16 1252

## 2016-12-05 NOTE — Discharge Instructions (Signed)
Please call [facility] first thing in the morning for an appointment to have your abscess drained.  Please continue to take your clindamycin every 6 hours. You have been given a dose in the emergency department tonight as well. You may take Zofran once every 8 hours as needed for nausea. This tablet will just dissolve under your tongue.   If your symptoms significantly worsen overnight, including if you have worsening difficulty breathing, please return to the emergency department for reevaluation.

## 2017-01-02 ENCOUNTER — Encounter (HOSPITAL_COMMUNITY): Payer: Self-pay | Admitting: *Deleted

## 2017-01-02 ENCOUNTER — Ambulatory Visit (HOSPITAL_COMMUNITY)
Admission: EM | Admit: 2017-01-02 | Discharge: 2017-01-02 | Disposition: A | Discharge status: Home / Self Care | Payer: Medicaid Other | Attending: Internal Medicine | Admitting: Internal Medicine

## 2017-01-02 DIAGNOSIS — R05 Cough: Secondary | ICD-10-CM

## 2017-01-02 DIAGNOSIS — J029 Acute pharyngitis, unspecified: Secondary | ICD-10-CM | POA: Diagnosis not present

## 2017-01-02 DIAGNOSIS — J3489 Other specified disorders of nose and nasal sinuses: Secondary | ICD-10-CM | POA: Insufficient documentation

## 2017-01-02 DIAGNOSIS — R51 Headache: Secondary | ICD-10-CM | POA: Diagnosis not present

## 2017-01-02 LAB — POCT RAPID STREP A: Streptococcus, Group A Screen (Direct): NEGATIVE

## 2017-01-02 NOTE — ED Triage Notes (Signed)
sorethroat         Headache     Headache                 X   2  Days            sratchy   Throat

## 2017-01-02 NOTE — ED Provider Notes (Signed)
[place]    CSN: [id number] Arrival date & time: 01/02/17  9024  History   Chief Complaint Chief Complaint  Patient presents with  . Sore Throat    HPI Leslie [name] is a 21 y.o. female.   HPI  Patient presents with sore throat.   Began two days ago. Also endorsing non-productive cough, rhinorrhea, and HA. Denies fevers, chills, difficulty breathing, rashes. Has a normal appetite and has been drinking well. Has taken naproxen and ibuprofen which both helped with throat pain. 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 Patient thinks she probably just has a cold, however she had a peritonsillar abscess about a month ago and just wanted to be sure this was not returning. Denies neck swelling or muffled voice like she experienced with PTA. Says that symptoms now generally feel very different than PTA.   History reviewed. No pertinent past medical history.  Patient Active Problem List   Diagnosis Date Noted  . Pure hypercholesterolemia 06/12/2016    History reviewed. No pertinent surgical history.  OB History    No data available       Home Medications    Prior to Admission medications   Medication Sig Start Date End Date Taking? Authorizing Provider  clindamycin (CLEOCIN) 300 MG capsule Take 1 capsule (300 mg total) by mouth every 6 (six) hours. 12/02/16   Ward, Delice Bison, DO  HYDROcodone-acetaminophen (HYCET) 7.5-325 mg/15 ml solution 15 mL every 4-6 hours as needed for pain. 12/04/16   Charlesetta Shanks, MD  ibuprofen (ADVIL,MOTRIN) 200 MG tablet Take 600-800 mg by mouth every 6 (six) hours as needed for mild pain.    [provider]  levonorgestrel (MIRENA) 20 MCG/24HR IUD 1 each by Intrauterine route once.    [provider]  naproxen (NAPROSYN) 500 MG tablet Take 1 tablet (500 mg total) by mouth 2 (two) times daily with a meal. Patient not taking: Reported on 12/03/2016 09/06/16   Clent Demark, PA-C  ondansetron [facility] ODT) 4 MG  disintegrating tablet Take 1 tablet (4 mg total) by mouth every 8 (eight) hours as needed for nausea or vomiting. 12/05/16   McDonald, Laymond Purser, PA-C    Family History History reviewed. No pertinent family history.  Social History Social History  Substance Use Topics  . Smoking status: Never Smoker  . Smokeless tobacco: Never Used  . Alcohol use No     Allergies   Patient has no known allergies.   Review of Systems Review of Systems  Constitutional: Negative for appetite change, chills and fever.  HENT: Positive for congestion and sore throat. Negative for trouble swallowing and voice change.   Respiratory: Positive for cough. Negative for shortness of breath and wheezing.   Skin: Negative for rash.  Neurological: Positive for headaches.     Physical Exam Triage Vital Signs ED Triage Vitals  Enc Vitals Group     BP      Pulse      Resp      Temp      Temp src      SpO2      Weight      Height      Head Circumference      Peak Flow      Pain Score      Pain Loc      Pain Edu?      Excl. in [place]?    No data found.   Updated Vital Signs BP 130/70 (BP Location:  Right Arm)   Pulse 78   Temp 98.6 F (37 C) (Oral)   Resp 18   SpO2 100%   Visual Acuity Right Eye Distance:   Left Eye Distance:   Bilateral Distance:    Right Eye Near:   Left Eye Near:    Bilateral Near:     Physical Exam  Constitutional: She is oriented to person, place, and time. She appears well-developed and well-nourished. No distress.  HENT:  Head: Normocephalic and atraumatic.  Nose: Nose normal.  MMM. No oropharyngeal erythema or exudates. Uvula midline. Tonsils not enlarged and symmetric.  Eyes: Pupils are equal, round, and reactive to light. Conjunctivae and EOM are normal. Right eye exhibits no discharge. Left eye exhibits no discharge.  Neck:  No neck swelling or TTP. Full ROM. Supple. No adenopathy.   Cardiovascular: Normal rate, regular rhythm and normal heart sounds.   No  murmur heard. Pulmonary/Chest: Effort normal and breath sounds normal. No respiratory distress. She has no wheezes.  Musculoskeletal: Normal range of motion.  Neurological: She is alert and oriented to person, place, and time.  Skin: Skin is warm and dry. No rash noted.  Psychiatric: She has a normal mood and affect. Her behavior is normal.    UC Treatments / Results  Labs (all labs ordered are listed, but only abnormal results are displayed) Labs Reviewed  CULTURE, GROUP A STREP [facility])  POCT RAPID STREP A    EKG  EKG Interpretation None       Radiology No results found.  Procedures Procedures (including critical care time)  Medications Ordered in UC Medications - No data to display   Initial Impression / Assessment and Plan / UC Course  I have reviewed the triage vital signs and the nursing notes.  Pertinent labs & imaging results that were available during my care of the patient were reviewed by me and considered in my medical decision making (see chart for details).     Patient presenting with sore throat. Most likely 2/2 viral URI, as patient also with cough and nasal congestion. Afebrile, well-hydrated, and well-appearing on exam today. Rapid strep negative. Less likely PTA, as patient without neck swelling, tonsils not enlarged and symmetric, uvula midline, no muffled voice, no difficulty breathing or swallowing. Will manage conservatively. Discussed reasons to return to urgent care or to ED, including neck swelling or pain, trouble breathing or swallowing, or development of symptoms she experienced last month with PTA.   Final Clinical Impressions(s) / UC Diagnoses   Final diagnoses:  Sore throat    New Prescriptions New Prescriptions   No medications on file   Adin Hector, MD, MPH PGY-3    Verner Mould, MD 01/02/17 1050

## 2017-01-02 NOTE — Discharge Instructions (Signed)
It was nice meeting you today Leslie [name]!  Your symptoms are most likely due to a virus. For you sore throat, you can buy Chloraseptic throat spray or Cepachol throat lozenges at the drugstore. These both have a numbing medicine in them that will help with your throat pain. You can also take ibuprofen or naproxen every 4-6 hours as needed, however do not take them together.   For your cough, you can have a teaspoonful of honey, either alone or mixed with a warm beverage, 3-4 times a day. This may be especially helpful before bedtime.   Be sure to drink plenty of water. If the pain gets worse and you are unable to drink, please come back to Urgent Care or go to the emergency room.   If you start to have trouble breathing, have a muffled voice, or neck swelling (the symptoms you experienced when you had the abscess), please come back to Urgent Care or go to the emergency room.   If you have any questions or concerns, please feel free to call the clinic.   Be well,  Dr. Avon Gully

## 2017-01-04 LAB — CULTURE, GROUP A STREP (THRC)

## 2017-04-17 ENCOUNTER — Ambulatory Visit (INDEPENDENT_AMBULATORY_CARE_PROVIDER_SITE_OTHER): Payer: Medicaid Other | Admitting: Physician Assistant

## 2017-04-17 ENCOUNTER — Other Ambulatory Visit: Payer: Self-pay

## 2017-04-17 ENCOUNTER — Encounter (INDEPENDENT_AMBULATORY_CARE_PROVIDER_SITE_OTHER): Payer: Self-pay | Admitting: Physician Assistant

## 2017-04-17 VITALS — BP 125/90 | HR 83 | Temp 97.6°F | Wt 288.6 lb

## 2017-04-17 DIAGNOSIS — Z30431 Encounter for routine checking of intrauterine contraceptive device: Secondary | ICD-10-CM | POA: Diagnosis not present

## 2017-04-17 DIAGNOSIS — Z6841 Body Mass Index (BMI) 40.0 and over, adult: Secondary | ICD-10-CM

## 2017-04-17 MED ORDER — PHENTERMINE HCL 15 MG PO CAPS: 15.0000 mg | ORAL_CAPSULE | ORAL | 0 refills | Status: DC

## 2017-04-17 NOTE — Patient Instructions (Signed)
Obesity, Adult Obesity is having too much body fat. If you have a BMI of 30 or more, you are obese. BMI is a number that explains how much body fat you have. Obesity is often caused by taking in (consuming) more calories than your body uses. Obesity can cause serious health problems. Changing your lifestyle can help to treat obesity. Follow these instructions at home: Eating and drinking   Follow advice from your doctor about what to eat and drink. Your doctor may tell you to: ? Cut down on (limit) fast foods, sweets, and processed snack foods. ? Choose low-fat options. For example, choose low-fat milk instead of whole milk. ? Eat 5 or more servings of fruits or vegetables every day. ? Eat at home more often. This gives you more control over what you eat. ? Choose healthy foods when you eat out. ? Learn what a healthy portion size is. A portion size is the amount of a certain food that is healthy for you to eat at one time. This is different for each person. ? Keep low-fat snacks available. ? Avoid sugary drinks. These include soda, fruit juice, iced tea that is sweetened with sugar, and flavored milk. ? Eat a healthy breakfast.  Drink enough water to keep your pee (urine) clear or pale yellow.  Do not go without eating for long periods of time (do not fast).  Do not go on popular or trendy diets (fad diets). Physical Activity  Exercise often, as told by your doctor. Ask your doctor: ? What types of exercise are safe for you. ? How often you should exercise.  Warm up and stretch before being active.  Do slow stretching after being active (cool down).  Rest between times of being active. Lifestyle  Limit how much time you spend in front of your TV, computer, or video game system (be less sedentary).  Find ways to reward yourself that do not involve food.  Limit alcohol intake to no more than 1 drink a day for nonpregnant women and 2 drinks a day for men. One drink equals 12 oz  of beer, 5 oz of wine, or 1 oz of hard liquor. General instructions  Keep a weight loss journal. This can help you keep track of: ? The food that you eat. ? The exercise that you do.  Take over-the-counter and prescription medicines only as told by your doctor.  Take vitamins and supplements only as told by your doctor.  Think about joining a support group. Your doctor may be able to help with this.  Keep all follow-up visits as told by your doctor. This is important. Contact a doctor if:  You cannot meet your weight loss goal after you have changed your diet and lifestyle for 6 weeks. This information is not intended to replace advice given to you by your health care provider. Make sure you discuss any questions you have with your health care provider. Document Released: 06/18/2011 Document Revised: 09/01/2015 Document Reviewed: 01/12/2015 [employer]vier Interactive Patient Education  2018 [employer]  

## 2017-04-17 NOTE — Progress Notes (Signed)
Subjective:  Patient ID: Leslie [name], female    DOB: 1995-10-10  Age: 22 y.o. MRN: [id number]  CC: weight gain  HPI  Adelle Zachar is a 22 y.o. female with a PMH of HLD and tonsillar abscess presents with concern for weight gain. Has gained 7 lbs over the course of her first visit here on 06/11/16. Requests an appettite suppressant. Has been to a nutrition class with her mother approximately one year ago for an outside weight loss program. The program helped patient lose approximately 50 lbs but the patient eventually stopped the diet and gained weight back. Does not think she has time to go to a nutrition class. Does not diet or exercise currently. Plans to go to the gym before her work time at 1000 hours. Does not endorse CP, palpitations, SOB, abdominal pain, f/c/n/v, rash, and GI/GU.    Would like to have Mirena taken out. Placed four years ago.   Outpatient Medications Prior to Visit  Medication Sig Dispense Refill  . levonorgestrel (MIRENA) 20 MCG/24HR IUD 1 each by Intrauterine route once.    . clindamycin (CLEOCIN) 300 MG capsule Take 1 capsule (300 mg total) by mouth every 6 (six) hours. (Patient not taking: Reported on 04/17/2017) 30 capsule 0  . HYDROcodone-acetaminophen (HYCET) 7.5-325 mg/15 ml solution 15 mL every 4-6 hours as needed for pain. (Patient not taking: Reported on 04/17/2017) 118 mL 0  . ibuprofen (ADVIL,MOTRIN) 200 MG tablet Take 600-800 mg by mouth every 6 (six) hours as needed for mild pain.    . naproxen (NAPROSYN) 500 MG tablet Take 1 tablet (500 mg total) by mouth 2 (two) times daily with a meal. (Patient not taking: Reported on 12/03/2016) 14 tablet 0  . ondansetron (ZOFRAN ODT) 4 MG disintegrating tablet Take 1 tablet (4 mg total) by mouth every 8 (eight) hours as needed for nausea or vomiting. (Patient not taking: Reported on 04/17/2017) 20 tablet 0   No facility-administered medications prior to visit.      ROS Review of Systems  Constitutional: Negative for  chills, fever and malaise/fatigue.  Eyes: Negative for blurred vision.  Respiratory: Negative for shortness of breath.   Cardiovascular: Negative for chest pain and palpitations.  Gastrointestinal: Negative for abdominal pain and nausea.  Genitourinary: Negative for dysuria and hematuria.  Musculoskeletal: Negative for joint pain and myalgias.  Skin: Negative for rash.  Neurological: Negative for tingling and headaches.  Psychiatric/Behavioral: Negative for depression. The patient is not nervous/anxious.     Objective:   Vitals:   04/17/17 1026  BP: 125/90  Pulse: 83  Temp: 97.6 F (36.4 C)  SpO2: 99%    Physical Exam  Constitutional: She is oriented to person, place, and time.  Well developed, obese, NAD, polite  HENT:  Head: Normocephalic and atraumatic.  Eyes: No scleral icterus.  Neck: Normal range of motion. Neck supple. No thyromegaly present.  Cardiovascular: Normal rate, regular rhythm, normal heart sounds and intact distal pulses. Exam reveals no gallop.  No murmur heard. Pulmonary/Chest: Effort normal and breath sounds normal. No respiratory distress. She has no wheezes. She has no rales.  Abdominal: Soft. Bowel sounds are normal. There is no tenderness.  Musculoskeletal: She exhibits no edema.  Neurological: She is alert and oriented to person, place, and time. No cranial nerve deficit. Coordination normal.  Skin: Skin is warm and dry. No rash noted. No erythema. No pallor.  Psychiatric: She has a normal mood and affect. Her behavior is normal. Thought content normal.  Vitals reviewed.    Assessment & Plan:    1. Class 3 severe obesity without serious comorbidity with body mass index (BMI) of 40.0 to 44.9 in adult, unspecified obesity type ([place]) - Begin phentermine 15 MG capsule; Take 1 capsule (15 mg total) by mouth every morning.  Dispense: 30 capsule; Refill: 0 - TSH - Comprehensive metabolic panel - I have strongly advised patient to be aware of any  cardiovascular symptoms such as tachycardia, high BP, chest pain, palpitations, etc, when taking phentermine. I have advised to change her diet and start exercising as this will be what sustains long term weight loss.   2. Encounter for management of intrauterine contraceptive device (IUD), unspecified IUD management type - Ambulatory referral to Gynecology   Meds ordered this encounter  Medications  . phentermine 15 MG capsule    Sig: Take 1 capsule (15 mg total) by mouth every morning.    Dispense:  30 capsule    Refill:  0    Order Specific Question:   Supervising Provider    Answer:   Tresa Garter [[id number]]    Follow-up: 4 weeks   Clent Demark PA

## 2017-04-18 ENCOUNTER — Encounter (INDEPENDENT_AMBULATORY_CARE_PROVIDER_SITE_OTHER): Payer: Self-pay | Admitting: Physician Assistant

## 2017-04-18 ENCOUNTER — Telehealth (INDEPENDENT_AMBULATORY_CARE_PROVIDER_SITE_OTHER): Payer: Self-pay

## 2017-04-18 LAB — COMPREHENSIVE METABOLIC PANEL
ALK PHOS: 95 IU/L (ref 39–117)
ALT: 27 IU/L (ref 0–32)
AST: 18 IU/L (ref 0–40)
Albumin/Globulin Ratio: 1.4 (ref 1.2–2.2)
Albumin: 4.2 g/dL (ref 3.5–5.5)
BILIRUBIN TOTAL: 0.3 mg/dL (ref 0.0–1.2)
BUN/Creatinine Ratio: 14 (ref 9–23)
BUN: 10 mg/dL (ref 6–20)
CHLORIDE: 103 mmol/L (ref 96–106)
CO2: 23 mmol/L (ref 20–29)
Calcium: 9.3 mg/dL (ref 8.7–10.2)
Creatinine, Ser: 0.72 mg/dL (ref 0.57–1.00)
GFR calc non Af Amer: 118 mL/min/{1.73_m2} (ref >59)
GFR, EST AFRICAN AMERICAN: 137 mL/min/{1.73_m2} (ref >59)
GLUCOSE: 103 mg/dL — AB (ref 65–99)
Globulin, Total: 3.1 g/dL (ref 1.5–4.5)
POTASSIUM: 4.7 mmol/L (ref 3.5–5.2)
Sodium: 139 mmol/L (ref 134–144)
Total Protein: 7.3 g/dL (ref 6.0–8.5)

## 2017-04-18 LAB — TSH: TSH: 1.45 u[IU]/mL (ref 0.450–4.500)

## 2017-04-18 NOTE — Telephone Encounter (Signed)
-----   Message from Clent Demark, PA-C sent at 04/18/2017  8:51 AM EST ----- Mildly impaired glucose. We will do an A1c when she returns on follow up. Rest of labs normal.

## 2017-04-18 NOTE — Telephone Encounter (Signed)
Patient is aware of mildly impaired glucose and A1c at next visit to check for diabetes. Patient also aware of all other results being normal. Nat Christen, CMA

## 2017-04-23 ENCOUNTER — Encounter (INDEPENDENT_AMBULATORY_CARE_PROVIDER_SITE_OTHER): Payer: Self-pay | Admitting: Physician Assistant

## 2017-05-13 ENCOUNTER — Ambulatory Visit (INDEPENDENT_AMBULATORY_CARE_PROVIDER_SITE_OTHER): Payer: Medicaid Other | Admitting: Physician Assistant

## 2017-05-24 ENCOUNTER — Ambulatory Visit (INDEPENDENT_AMBULATORY_CARE_PROVIDER_SITE_OTHER): Payer: Medicaid Other | Admitting: Physician Assistant

## 2017-07-24 ENCOUNTER — Ambulatory Visit (INDEPENDENT_AMBULATORY_CARE_PROVIDER_SITE_OTHER): Payer: BC Managed Care – PPO | Admitting: Obstetrics and Gynecology

## 2017-07-24 ENCOUNTER — Other Ambulatory Visit (HOSPITAL_COMMUNITY)
Admission: RE | Admit: 2017-07-24 | Discharge: 2017-07-24 | Disposition: A | Discharge status: Home / Self Care | Payer: BC Managed Care – PPO | Source: Ambulatory Visit | Attending: Obstetrics and Gynecology | Admitting: Obstetrics and Gynecology

## 2017-07-24 ENCOUNTER — Encounter: Payer: Self-pay | Admitting: Obstetrics and Gynecology

## 2017-07-24 ENCOUNTER — Encounter: Payer: Self-pay | Admitting: *Deleted

## 2017-07-24 VITALS — BP 120/82 | HR 79 | Ht 68.0 in | Wt 281.1 lb

## 2017-07-24 DIAGNOSIS — N898 Other specified noninflammatory disorders of vagina: Secondary | ICD-10-CM | POA: Insufficient documentation

## 2017-07-24 DIAGNOSIS — Z01419 Encounter for gynecological examination (general) (routine) without abnormal findings: Secondary | ICD-10-CM | POA: Diagnosis present

## 2017-07-24 DIAGNOSIS — T8332XA Displacement of intrauterine contraceptive device, initial encounter: Secondary | ICD-10-CM | POA: Insufficient documentation

## 2017-07-24 DIAGNOSIS — R87610 Atypical squamous cells of undetermined significance on cytologic smear of cervix (ASC-US): Secondary | ICD-10-CM | POA: Insufficient documentation

## 2017-07-24 NOTE — Progress Notes (Signed)
Leslie [name] is a 22 y.o. G4P1001 female here for a routine annual gynecologic exam. She desires to have her IUD removed.   Denies abnormal vaginal bleeding, discharge, pelvic pain, problems with intercourse or other gynecologic concerns.    Gynecologic History No LMP recorded. (Menstrual status: IUD). Contraception: IUD Last Pap: 2015. Results were: normal Last mammogram: BA. Results were: NA  Obstetric History OB History  Gravida Para Term Preterm AB Living  1 1 1     1   SAB TAB Ectopic Multiple Live Births          1    # Outcome Date GA Lbr Len/2nd Weight Sex Delivery Anes PTL Lv  1 Term 09/26/13     CS-LTranv   LIV    History reviewed. No pertinent past medical history.  Past Surgical History:  Procedure Laterality Date  . CESAREAN SECTION  09/26/2013    Current Outpatient Medications on File Prior to Visit  Medication Sig Dispense Refill  . ibuprofen (ADVIL,MOTRIN) 200 MG tablet Take 600-800 mg by mouth every 6 (six) hours as needed for mild pain.    Marland Kitchen levonorgestrel (MIRENA) 20 MCG/24HR IUD 1 each by Intrauterine route once.     No current facility-administered medications on file prior to visit.     No Known Allergies  Social History   Socioeconomic History  . Marital status: Single    Spouse name: Not on file  . Number of children: Not on file  . Years of education: Not on file  . Highest education level: Not on file  Occupational History  . Not on file  Social Needs  . Financial resource strain: Not on file  . Food insecurity:    Worry: Not on file    Inability: Not on file  . Transportation needs:    Medical: Not on file    Non-medical: Not on file  Tobacco Use  . Smoking status: Never Smoker  . Smokeless tobacco: Never Used  Substance and Sexual Activity  . Alcohol use: No  . Drug use: No  . Sexual activity: Yes    Partners: Male    Birth control/protection: IUD  Lifestyle  . Physical activity:    Days per week: Not on file    Minutes per  session: Not on file  . Stress: Not on file  Relationships  . Social connections:    Talks on phone: Not on file    Gets together: Not on file    Attends religious service: Not on file    Active member of club or organization: Not on file    Attends meetings of clubs or organizations: Not on file    Relationship status: Not on file  . Intimate partner violence:    Fear of current or ex partner: Not on file    Emotionally abused: Not on file    Physically abused: Not on file    Forced sexual activity: Not on file  Other Topics Concern  . Not on file  Social History Narrative  . Not on file    Family History  Problem Relation Age of Onset  . Hypertension Mother   . Diabetes Maternal Grandmother   . Diabetes Maternal Grandfather     The following portions of the patient's history were reviewed and updated as appropriate: allergies, current medications, past family history, past medical history, past social history, past surgical history and problem list.  Review of Systems Pertinent items are noted in HPI.   Objective:  BP 120/82   Pulse 79   Ht 5\' 8"  (1.727 m)   Wt 281 lb 1.6 oz (127.5 kg)   BMI 42.74 kg/m  CONSTITUTIONAL: Well-developed, well-nourished female in no acute distress.  HENT:  Normocephalic, atraumatic, External right and left ear normal. Oropharynx is clear and moist EYES: Conjunctivae and EOM are normal. Pupils are equal, round, and reactive to light. No scleral icterus.  NECK: Normal range of motion, supple, no masses.  Normal thyroid.  SKIN: Skin is warm and dry. No rash noted. Not diaphoretic. No erythema. No pallor. [place]: Alert and oriented to person, place, and time. Normal reflexes, muscle tone coordination. No cranial nerve deficit noted. PSYCHIATRIC: Normal mood and affect. Normal behavior. Normal judgment and thought content. CARDIOVASCULAR: Normal heart rate noted, regular rhythm RESPIRATORY: Clear to auscultation bilaterally. Effort and  breath sounds normal, no problems with respiration noted. BREASTS: Symmetric in size. No masses, skin changes, nipple drainage, or lymphadenopathy. ABDOMEN: Soft, normal bowel sounds, no distention noted.  No tenderness, rebound or guarding.  PELVIC: Normal appearing external genitalia; normal appearing vaginal mucosa and cervix.  No abnormal discharge noted.  Pap smear obtained.  Normal uterine size, no other palpable masses, no uterine or adnexal tenderness. MUSCULOSKELETAL: Normal range of motion. No tenderness.  No cyanosis, clubbing, or edema.  2+ distal pulses.    Procedure: Informed consent obtained. Speculum placed. Cervix visualized. IUD strings not seen. Attempted to find string in endocervical canal  With [facility] Unable to do so. Procedure abandoned after several attempts.     Assessment:  Annual gynecologic examination with pap smear Loss IUD strings  Plan:  Will follow up results of pap smear and manage accordingly. Will check GYN U/S. Follow for removal after U/S results.  Routine preventative health maintenance measures emphasized. Please refer to After Visit Summary for other counseling recommendations.    Chancy Milroy, MD, [place] Attending [place] for [facility], [place]

## 2017-07-24 NOTE — Patient Instructions (Signed)

## 2017-07-25 LAB — CERVICOVAGINAL ANCILLARY ONLY
CHLAMYDIA, DNA PROBE: NEGATIVE
Neisseria Gonorrhea: NEGATIVE

## 2017-07-30 LAB — CYTOLOGY - PAP
Diagnosis: UNDETERMINED — AB
HPV: DETECTED — AB

## 2017-08-02 ENCOUNTER — Ambulatory Visit (HOSPITAL_COMMUNITY)
Admission: RE | Admit: 2017-08-02 | Discharge: 2017-08-02 | Disposition: A | Discharge status: Home / Self Care | Payer: BC Managed Care – PPO | Source: Ambulatory Visit | Attending: Obstetrics and Gynecology | Admitting: Obstetrics and Gynecology

## 2017-08-02 DIAGNOSIS — T8332XA Displacement of intrauterine contraceptive device, initial encounter: Secondary | ICD-10-CM

## 2017-08-02 DIAGNOSIS — Z30431 Encounter for routine checking of intrauterine contraceptive device: Secondary | ICD-10-CM | POA: Insufficient documentation

## 2017-08-05 ENCOUNTER — Telehealth: Payer: Self-pay

## 2017-08-05 NOTE — Telephone Encounter (Signed)
TC to pt regarding message  Pt was concerned about pap results. Pt made aware no cervical cancer was detected and to discuss results at next appt. Pt info given to scheduling to schedule IUD removal.

## 2017-08-07 ENCOUNTER — Telehealth: Payer: Self-pay

## 2017-08-07 NOTE — Telephone Encounter (Signed)
Pt will have IUD removal at [facility] office May 31st .  Per Dr.Ervin recommendations.

## 2017-08-13 ENCOUNTER — Ambulatory Visit: Payer: BC Managed Care – PPO | Admitting: Obstetrics & Gynecology

## 2017-08-22 ENCOUNTER — Encounter (HOSPITAL_COMMUNITY): Payer: Self-pay | Admitting: Emergency Medicine

## 2017-08-22 ENCOUNTER — Other Ambulatory Visit: Payer: Self-pay

## 2017-08-22 DIAGNOSIS — J029 Acute pharyngitis, unspecified: Secondary | ICD-10-CM | POA: Insufficient documentation

## 2017-08-22 NOTE — ED Triage Notes (Signed)
Pt from home with c/o peritonsillar abscess. Pt has hx of same. Pt has significant swelling in throat Pt rates pain 7/10. Pt has no difficulty breathing.

## 2017-08-23 ENCOUNTER — Emergency Department (HOSPITAL_COMMUNITY)
Admission: EM | Admit: 2017-08-23 | Discharge: 2017-08-23 | Disposition: A | Discharge status: Home / Self Care | Payer: BC Managed Care – PPO | Attending: Emergency Medicine | Admitting: Emergency Medicine

## 2017-08-23 DIAGNOSIS — J029 Acute pharyngitis, unspecified: Secondary | ICD-10-CM

## 2017-08-23 MED ORDER — CLINDAMYCIN HCL 300 MG PO CAPS: 300.0000 mg | ORAL_CAPSULE | Freq: Three times a day (TID) | ORAL | 0 refills | Status: DC

## 2017-08-23 MED ORDER — NAPROXEN 500 MG PO TABS
500.0000 mg | ORAL_TABLET | Freq: Once | ORAL | Status: AC
  Administered 2017-08-23: 500 mg via ORAL
  Filled 2017-08-23: qty 1

## 2017-08-23 MED ORDER — CLINDAMYCIN HCL 300 MG PO CAPS
300.0000 mg | ORAL_CAPSULE | Freq: Once | ORAL | Status: AC
  Administered 2017-08-23: 300 mg via ORAL
  Filled 2017-08-23: qty 1

## 2017-08-23 MED ORDER — NAPROXEN 500 MG PO TABS: 500.0000 mg | ORAL_TABLET | Freq: Two times a day (BID) | ORAL | 0 refills | Status: DC

## 2017-08-23 NOTE — ED Provider Notes (Signed)
[facility] DEPT Provider Note   CSN: [id number] Arrival date & time: 08/22/17  2341     History   Chief Complaint Chief Complaint  Patient presents with  . Abscess    peritonsillar     HPI Leslie [name] is a 22 y.o. female.  The history is provided by the patient.  Abscess  Progression:  Worsening Chronicity:  New Context: not diabetes   Relieved by:  Nothing Exacerbated by: swallowing. Associated symptoms: no fever and no vomiting   Patient presents with sore throat and concern for peritonsillar abscess.  She has had one previously that required drainage.  She reports over the past 12 hours she been having painful swallowing her right side.  No fever/vomiting.  No drooling.  No stiff neck is reported  PMH-none Patient Active Problem List   Diagnosis Date Noted  . IUD strings lost 07/24/2017  . Pure hypercholesterolemia 06/12/2016    Past Surgical History:  Procedure Laterality Date  . CESAREAN SECTION  09/26/2013     OB History    Gravida  1   Para  1   Term  1   Preterm      AB      Living  1     SAB      TAB      Ectopic      Multiple      Live Births  1            Home Medications    Prior to Admission medications   Medication Sig Start Date End Date Taking? Authorizing Provider  ibuprofen (ADVIL,MOTRIN) 200 MG tablet Take 600-800 mg by mouth every 6 (six) hours as needed for mild pain.   Yes [provider]  clindamycin (CLEOCIN) 300 MG capsule Take 1 capsule (300 mg total) by mouth 3 (three) times daily. X 7 days 08/23/17   Ripley Fraise, MD  naproxen (NAPROSYN) 500 MG tablet Take 1 tablet (500 mg total) by mouth 2 (two) times daily. 08/23/17   Ripley Fraise, MD    Family History Family History  Problem Relation Age of Onset  . Hypertension Mother   . Diabetes Maternal Grandmother   . Diabetes Maternal Grandfather     Social History Social History   Tobacco Use  . Smoking  status: Never Smoker  . Smokeless tobacco: Never Used  Substance Use Topics  . Alcohol use: No  . Drug use: No     Allergies   Patient has no known allergies.   Review of Systems Review of Systems  Constitutional: Negative for fever.  HENT: Negative for drooling.   Gastrointestinal: Negative for vomiting.  All other systems reviewed and are negative.    Physical Exam Updated Vital Signs BP 98/67 (BP Location: Left Arm)   Pulse 69   Temp 97.9 F (36.6 C) (Oral)   Resp 18   SpO2 100%   Physical Exam CONSTITUTIONAL: Well developed/well nourished HEAD: Normocephalic/atraumatic EYES: EOMI/PERRL ENMT: Mucous membranes moist, uvula midline, tonsillar exudates and erythema noted, no stridor, no drooling, normal phonation NECK: supple no meningeal signs No anterior neck edema, no anterior lymphadenopathy SPINE/BACK:entire spine nontender CV: S1/S2 noted, no murmurs/rubs/gallops noted LUNGS: Lungs are clear to auscultation bilaterally, no apparent distress ABDOMEN: soft, nontender NEURO: Pt is awake/alert/appropriate, moves all extremitiesx4.  No facial droop.   EXTREMITIES: pulses normal/equal, full ROM SKIN: warm, color normal PSYCH: no abnormalities of mood noted, alert and oriented to situation   ED Treatments /  Results  Labs (all labs ordered are listed, but only abnormal results are displayed) Labs Reviewed - No data to display  EKG None  Radiology No results found.  Procedures Procedures (including critical care time)  Medications Ordered in ED Medications  clindamycin (CLEOCIN) capsule 300 mg (300 mg Oral Given 08/23/17 0350)  naproxen (NAPROSYN) tablet 500 mg (500 mg Oral Given 08/23/17 0349)     Initial Impression / Assessment and Plan / ED Course  I have reviewed the triage vital signs and the nursing notes.      Patient is showing signs of early pharyngitis, but no signs of PTA at this time.  Her voice is normal and has no drooling.  She is  nontoxic Will start oral clindamycin  we discussed strict return precautions  Final Clinical Impressions(s) / ED Diagnoses   Final diagnoses:  Pharyngitis, unspecified etiology    ED Discharge Orders        Ordered    clindamycin (CLEOCIN) 300 MG capsule  3 times daily     08/23/17 0341    naproxen (NAPROSYN) 500 MG tablet  2 times daily     08/23/17 0341       Ripley Fraise, MD 08/23/17 [phone]

## 2017-08-24 ENCOUNTER — Emergency Department (HOSPITAL_COMMUNITY): Payer: BC Managed Care – PPO

## 2017-08-24 ENCOUNTER — Encounter (HOSPITAL_COMMUNITY): Payer: Self-pay

## 2017-08-24 ENCOUNTER — Emergency Department (HOSPITAL_COMMUNITY)
Admission: EM | Admit: 2017-08-24 | Discharge: 2017-08-24 | Disposition: A | Discharge status: Home / Self Care | Payer: BC Managed Care – PPO | Attending: Emergency Medicine | Admitting: Emergency Medicine

## 2017-08-24 ENCOUNTER — Other Ambulatory Visit: Payer: Self-pay

## 2017-08-24 DIAGNOSIS — J36 Peritonsillar abscess: Secondary | ICD-10-CM

## 2017-08-24 DIAGNOSIS — J029 Acute pharyngitis, unspecified: Secondary | ICD-10-CM | POA: Diagnosis present

## 2017-08-24 LAB — CBC WITH DIFFERENTIAL/PLATELET
Abs Immature Granulocytes: 0 10*3/uL (ref 0.0–0.1)
BASOS ABS: 0 10*3/uL (ref 0.0–0.1)
BASOS PCT: 0 %
EOS ABS: 0.1 10*3/uL (ref 0.0–0.7)
EOS PCT: 1 %
HCT: 39.6 % (ref 36.0–46.0)
HEMOGLOBIN: 12.5 g/dL (ref 12.0–15.0)
Immature Granulocytes: 0 %
Lymphocytes Relative: 22 %
Lymphs Abs: 1.5 10*3/uL (ref 0.7–4.0)
MCH: 27.7 pg (ref 26.0–34.0)
MCHC: 31.6 g/dL (ref 30.0–36.0)
MCV: 87.8 fL (ref 78.0–100.0)
MONO ABS: 0.4 10*3/uL (ref 0.1–1.0)
Monocytes Relative: 5 %
Neutro Abs: 5 10*3/uL (ref 1.7–7.7)
Neutrophils Relative %: 72 %
Platelets: 262 10*3/uL (ref 150–400)
RBC: 4.51 MIL/uL (ref 3.87–5.11)
RDW: 13.9 % (ref 11.5–15.5)
WBC: 6.9 10*3/uL (ref 4.0–10.5)

## 2017-08-24 LAB — BASIC METABOLIC PANEL
Anion gap: 7 (ref 5–15)
BUN: 7 mg/dL (ref 6–20)
CALCIUM: 9.1 mg/dL (ref 8.9–10.3)
CHLORIDE: 105 mmol/L (ref 101–111)
CO2: 26 mmol/L (ref 22–32)
CREATININE: 0.74 mg/dL (ref 0.44–1.00)
GFR calc Af Amer: >60 mL/min (ref >60)
GFR calc non Af Amer: >60 mL/min (ref >60)
Glucose, Bld: 108 mg/dL — ABNORMAL HIGH (ref 65–99)
Potassium: 3.7 mmol/L (ref 3.5–5.1)
SODIUM: 138 mmol/L (ref 135–145)

## 2017-08-24 LAB — I-STAT BETA HCG BLOOD, ED (MC, WL, AP ONLY)

## 2017-08-24 MED ORDER — DEXAMETHASONE SODIUM PHOSPHATE 10 MG/ML IJ SOLN
10.0000 mg | Freq: Once | INTRAMUSCULAR | Status: AC
  Administered 2017-08-24: 10 mg via INTRAVENOUS
  Filled 2017-08-24: qty 1

## 2017-08-24 MED ORDER — FENTANYL CITRATE (PF) 100 MCG/2ML IJ SOLN
50.0000 ug | Freq: Once | INTRAMUSCULAR | Status: AC
  Administered 2017-08-24: 50 ug via INTRAVENOUS
  Filled 2017-08-24: qty 2

## 2017-08-24 MED ORDER — HYDROCODONE-ACETAMINOPHEN 5-325 MG PO TABS
1.0000 | ORAL_TABLET | Freq: Once | ORAL | Status: DC
  Filled 2017-08-24: qty 1

## 2017-08-24 MED ORDER — HYDROCODONE-ACETAMINOPHEN 5-325 MG PO TABS: 1.0000 | ORAL_TABLET | Freq: Four times a day (QID) | ORAL | 0 refills | Status: DC | PRN

## 2017-08-24 MED ORDER — SODIUM CHLORIDE 0.9 % IV BOLUS
1000.0000 mL | Freq: Once | INTRAVENOUS | Status: AC
  Administered 2017-08-24: 1000 mL via INTRAVENOUS

## 2017-08-24 MED ORDER — IOHEXOL 300 MG/ML  SOLN
75.0000 mL | Freq: Once | INTRAMUSCULAR | Status: AC | PRN
  Administered 2017-08-24: 75 mL via INTRAVENOUS

## 2017-08-24 NOTE — ED Notes (Signed)
This RN was unable to use the Pyxis for waste due to this PT being discharged so I wasted 47mcg of Fentanyl with Lenice Pressman in the [employer]

## 2017-08-24 NOTE — Discharge Instructions (Signed)
You can take Tylenol or Ibuprofen as directed for pain. You can alternate Tylenol and Ibuprofen every 4 hours. If you take Tylenol at 1pm, then you can take Ibuprofen at 5pm. Then you can take Tylenol again at 9pm.  You take the pain medication for severe breakthrough pain.  Continue taking the clindamycin as directed.  Follow-up with [facility] ENT as directed.  Return to the emergency department for any fever, difficulty swallowing, difficulty breathing, worsening pain or any other worsening or concerning symptoms.

## 2017-08-24 NOTE — ED Notes (Signed)
Patient given discharge instructions and verbalized understanding.  Patient stable to discharge at this time.  Patient is alert and oriented to baseline.  No distressed noted at this time.  All belongings taken with the patient at discharge.   

## 2017-08-24 NOTE — ED Triage Notes (Signed)
Pt states she has an abscess on her tonsill last year. States she was seen at [facility] ED and given abx but reports that it has gotten worse since being seen. Pt also states that with previous abscess she had to have it drained.

## 2017-08-24 NOTE — Consult Note (Signed)
Reason for Consult PTA Referring Physician: ER  Leslie [name] is an 22 y.o. female.  HPI: hx of right PTA in 2018. Pt with 3-4 days of sore throat on the right side. She is not able to swallow well. She had I/D last time. She does not want to be admitted for IV abx.  History reviewed. No pertinent past medical history.  Past Surgical History:  Procedure Laterality Date  . CESAREAN SECTION  09/26/2013    Family History  Problem Relation Age of Onset  . Hypertension Mother   . Diabetes Maternal Grandmother   . Diabetes Maternal Grandfather     Social History:  reports that she has never smoked. She has never used smokeless tobacco. She reports that she does not drink alcohol or use drugs.  Allergies: No Known Allergies  Medications: I have reviewed the patient's current medications.  Results for orders placed or performed during the hospital encounter of 08/24/17 (from the past 48 hour(s))  CBC with Differential     Status: None   Collection Time: 08/24/17  3:43 PM  Result Value Ref Range   WBC 6.9 4.0 - 10.5 K/uL   RBC 4.51 3.87 - 5.11 MIL/uL   Hemoglobin 12.5 12.0 - 15.0 g/dL   HCT 39.6 36.0 - 46.0 %   MCV 87.8 78.0 - 100.0 fL   MCH 27.7 26.0 - 34.0 pg   MCHC 31.6 30.0 - 36.0 g/dL   RDW 13.9 11.5 - 15.5 %   Platelets 262 150 - 400 K/uL   Neutrophils Relative % 72 %   Neutro Abs 5.0 1.7 - 7.7 K/uL   Lymphocytes Relative 22 %   Lymphs Abs 1.5 0.7 - 4.0 K/uL   Monocytes Relative 5 %   Monocytes Absolute 0.4 0.1 - 1.0 K/uL   Eosinophils Relative 1 %   Eosinophils Absolute 0.1 0.0 - 0.7 K/uL   Basophils Relative 0 %   Basophils Absolute 0.0 0.0 - 0.1 K/uL   Immature Granulocytes 0 %   Abs Immature Granulocytes 0.0 0.0 - 0.1 K/uL    Comment: Performed at [facility], [address] [place], [place] [address]  Basic metabolic panel     Status: Abnormal   Collection Time: 08/24/17  3:43 PM  Result Value Ref Range   Sodium 138 135 - 145 mmol/L   Potassium 3.7 3.5  - 5.1 mmol/L   Chloride 105 101 - 111 mmol/L   CO2 26 22 - 32 mmol/L   Glucose, Bld 108 (H) 65 - 99 mg/dL   BUN 7 6 - 20 mg/dL   Creatinine, Ser 0.74 0.44 - 1.00 mg/dL   Calcium 9.1 8.9 - 10.3 mg/dL   GFR calc non Af Amer >60 >60 mL/min   GFR calc Af Amer >60 >60 mL/min    Comment: (NOTE) The eGFR has been calculated using the CKD EPI equation. This calculation has not been validated in all clinical situations. eGFR's persistently <60 mL/min signify possible Chronic Kidney Disease.    Anion gap 7 5 - 15    Comment: Performed at [facility] [address], [place], [place] [address]  I-Stat Beta hCG blood, ED ([facility], [facility], [facility] only)     Status: None   Collection Time: 08/24/17  3:51 PM  Result Value Ref Range   I-stat hCG, quantitative <5.0 <5 mIU/mL   Comment 3            Comment:   GEST. AGE  CONC.  (mIU/mL)   <=1 WEEK        5 - 50     2 WEEKS       50 - 500     3 WEEKS       100 - 10,000     4 WEEKS     1,000 - 30,000        FEMALE AND NON-PREGNANT FEMALE:     LESS THAN 5 mIU/mL     Ct Soft Tissue Neck W Contrast  Result Date: 08/24/2017 CLINICAL DATA:  Sore throat, stridor.  Antibiotics started yesterday EXAM: CT NECK WITH CONTRAST TECHNIQUE: Multidetector CT imaging of the neck was performed using the standard protocol following the bolus administration of intravenous contrast. CONTRAST:  33m OMNIPAQUE IOHEXOL 300 MG/ML  SOLN COMPARISON:  CT neck 12/05/2016 FINDINGS: Pharynx and larynx: Right tonsillar enlargement with central low-density fluid collection measuring 18 x 14 mm. This is compatible with an abscess which was present on the prior CT but is smaller than on the prior study. Mild mass-effect on the pharynx without airway compromise. Epiglottis and larynx normal. Salivary glands: 12 mm nodule right parotid tail probably a lymph node. Similar 8 mm nodule in the left parotid tail. This also is likely lymph node given the other adenopathy in the neck. Thyroid:  Negative Lymph nodes: Mild cervical adenopathy due to pharyngitis. Right level 2 lymph node 11 mm. Left level 2 lymph node 13 mm. Multiple subcentimeter posterior lymph nodes in the neck bilaterally. Vascular: Negative Limited intracranial: Negative Visualized orbits: Negative Mastoids and visualized paranasal sinuses: Mild mucosal edema paranasal sinuses. Skeleton: Negative Upper chest: Negative Other: None IMPRESSION: Right peritonsillar abscess 11 x 14 mm. Note that there was an abscess in the right tonsil on the prior CT of 12/05/2016. Mild reactive adenopathy in the neck. No airway compromise. Electronically Signed   By: CFranchot GalloM.D.   On: 08/24/2017 18:32    Review of Systems  Constitutional: Negative.   HENT: Positive for sore throat.   Eyes: Negative.   Respiratory: Negative.   Cardiovascular: Negative.   Skin: Negative.    Blood pressure 127/74, pulse 89, temperature 98.6 F (37 C), temperature source Oral, resp. rate 18, height 5' 8"  (1.727 m), weight 127.5 kg (281 lb), SpO2 100 %. Physical Exam  Constitutional: She appears well-developed and well-nourished.  HENT:  Head: Normocephalic and atraumatic.  Nose: Nose normal.  Right tonsil swelling with erythema. The uvula is slight deviated to the left. Palate is bulging and clearly a PTA. No much trismus. Tongue normal airway excellent.  Eyes: Pupils are equal, round, and reactive to light. Conjunctivae are normal.  Neck: Normal range of motion. Neck supple.  Cardiovascular: Normal rate.  Respiratory: Effort normal.  GI: Soft.    Assessment/Plan: Right PTA- She does not want to be admitted on IV abx. She wants I/D. We discussed risks, benefits, and options. All questions answered and consent obtained. The throat was sprayed with cetacaine and injected with 1% with epi. 1cc. Incision in right peritonsillar area made and opened with hemastat. A large amount of pus came out under pressure. She tolerated well. Good hemastasis.  She should continue her clindamycin and follow up if she wants her tonsils out. She should be back to normal in 3 days and if not call or follow up in ER.   JMelissa Montane5/18/2019, 9:31 PM

## 2017-08-24 NOTE — ED Provider Notes (Signed)
[facility] EMERGENCY DEPARTMENT Provider Note   CSN: [id number] Arrival date & time: 08/24/17  1458     History   Chief Complaint Chief Complaint  Patient presents with  . Sore Throat    HPI Leslie [name] is a 22 y.o. female past medical history of peritonsillar abscess who presents for evaluation of right-sided throat pain, difficulty swallowing.  Patient reports that this is been ongoing for the last several days but worsened yesterday.  Patient states she was seen at [facility], ED on 08/22/2017.  Her exam at that time was not concerning for peritonsillar abscess.  She did have some signs of some early pharyngitis and she was started on naproxen and clindamycin.  Patient reports she has been taking those medications.  She states that yesterday, she felt like her symptoms were worsening.  She states that she has been having difficulty swallowing and tolerating p.o.  She states she can tolerate her secretions but states that it is very painful.  Patient states she has not been able to take any of her regular medications secondary to the pain.  States that sometimes the symptoms make it difficult for her to breathe.  Patient denies any fevers, vomiting.   The history is provided by the patient.    History reviewed. No pertinent past medical history.  Patient Active Problem List   Diagnosis Date Noted  . IUD strings lost 07/24/2017  . Pure hypercholesterolemia 06/12/2016    Past Surgical History:  Procedure Laterality Date  . CESAREAN SECTION  09/26/2013     OB History    Gravida  1   Para  1   Term  1   Preterm      AB      Living  1     SAB      TAB      Ectopic      Multiple      Live Births  1            Home Medications    Prior to Admission medications   Medication Sig Start Date End Date Taking? Authorizing Provider  clindamycin (CLEOCIN) 300 MG capsule Take 1 capsule (300 mg total) by mouth 3 (three) times daily. X 7 days  08/23/17  Yes Ripley Fraise, MD  naproxen (NAPROSYN) 500 MG tablet Take 1 tablet (500 mg total) by mouth 2 (two) times daily. 08/23/17  Yes Ripley Fraise, MD  HYDROcodone-acetaminophen (NORCO/VICODIN) 5-325 MG tablet Take 1-2 tablets by mouth every 6 (six) hours as needed. 08/24/17   Volanda Napoleon, PA-C    Family History Family History  Problem Relation Age of Onset  . Hypertension Mother   . Diabetes Maternal Grandmother   . Diabetes Maternal Grandfather     Social History Social History   Tobacco Use  . Smoking status: Never Smoker  . Smokeless tobacco: Never Used  Substance Use Topics  . Alcohol use: No  . Drug use: No     Allergies   Patient has no known allergies.   Review of Systems Review of Systems  Constitutional: Positive for appetite change. Negative for fever.  HENT: Positive for sore throat and trouble swallowing.   Respiratory: Negative for shortness of breath.   Cardiovascular: Negative for chest pain.  Gastrointestinal: Negative for abdominal pain, nausea and vomiting.     Physical Exam Updated Vital Signs BP 127/74 (BP Location: Right Arm)   Pulse 89   Temp 98.6 F (37 C) (Oral)  Resp 18   Ht 5\' 8"  (1.727 m)   Wt 127.5 kg (281 lb)   SpO2 100%   BMI 42.73 kg/m   Physical Exam  Constitutional: She appears well-developed and well-nourished.  HENT:  Head: Normocephalic and atraumatic.  Uvula is deviated to the left.  There is obvious right-sided peritonsillar swelling and erythema.  Patient does have a slightly muffled voice.  Eyes: Conjunctivae and EOM are normal. Right eye exhibits no discharge. Left eye exhibits no discharge. No scleral icterus.  Pulmonary/Chest: Effort normal.  No evidence of respiratory distress.  Able speak in full sentences without any difficulty.  Lymphadenopathy:    She has cervical adenopathy.  Neurological: She is alert.  Skin: Skin is warm and dry.  Psychiatric: She has a normal mood and affect. Her  speech is normal and behavior is normal.  Nursing note and vitals reviewed.    ED Treatments / Results  Labs (all labs ordered are listed, but only abnormal results are displayed) Labs Reviewed  BASIC METABOLIC PANEL - Abnormal; Notable for the following components:      Result Value   Glucose, Bld 108 (*)    All other components within normal limits  CBC WITH DIFFERENTIAL/PLATELET  I-STAT BETA HCG BLOOD, ED ([facility], [facility], [facility] ONLY)    EKG None  Radiology Ct Soft Tissue Neck W Contrast  Result Date: 08/24/2017 CLINICAL DATA:  Sore throat, stridor.  Antibiotics started yesterday EXAM: CT NECK WITH CONTRAST TECHNIQUE: Multidetector CT imaging of the neck was performed using the standard protocol following the bolus administration of intravenous contrast. CONTRAST:  66mL OMNIPAQUE IOHEXOL 300 MG/ML  SOLN COMPARISON:  CT neck 12/05/2016 FINDINGS: Pharynx and larynx: Right tonsillar enlargement with central low-density fluid collection measuring 18 x 14 mm. This is compatible with an abscess which was present on the prior CT but is smaller than on the prior study. Mild mass-effect on the pharynx without airway compromise. Epiglottis and larynx normal. Salivary glands: 12 mm nodule right parotid tail probably a lymph node. Similar 8 mm nodule in the left parotid tail. This also is likely lymph node given the other adenopathy in the neck. Thyroid: Negative Lymph nodes: Mild cervical adenopathy due to pharyngitis. Right level 2 lymph node 11 mm. Left level 2 lymph node 13 mm. Multiple subcentimeter posterior lymph nodes in the neck bilaterally. Vascular: Negative Limited intracranial: Negative Visualized orbits: Negative Mastoids and visualized paranasal sinuses: Mild mucosal edema paranasal sinuses. Skeleton: Negative Upper chest: Negative Other: None IMPRESSION: Right peritonsillar abscess 11 x 14 mm. Note that there was an abscess in the right tonsil on the prior CT of 12/05/2016. Mild reactive adenopathy  in the neck. No airway compromise. Electronically Signed   By: Franchot Gallo M.D.   On: 08/24/2017 18:32    Procedures Procedures (including critical care time)  Medications Ordered in ED Medications  HYDROcodone-acetaminophen (NORCO/VICODIN) 5-325 MG per tablet 1 tablet (has no administration in time range)  fentaNYL (SUBLIMAZE) injection 50 mcg (has no administration in time range)  iohexol (OMNIPAQUE) 300 MG/ML solution 75 mL (75 mLs Intravenous Contrast Given 08/24/17 1720)  sodium chloride 0.9 % bolus 1,000 mL (1,000 mLs Intravenous New Bag/Given 08/24/17 2040)  dexamethasone (DECADRON) injection 10 mg (10 mg Intravenous Given 08/24/17 2041)     Initial Impression / Assessment and Plan / ED Course  I have reviewed the triage vital signs and the nursing notes.  Pertinent labs & imaging results that were available during my care of the patient  were reviewed by me and considered in my medical decision making (see chart for details).     22 year old female with past medical history of peritonsillar abscess who presents for evaluation of worsening sore throat, difficulty swallowing.  Seen at [facility] ED on 08/22/2017 for evaluation of symptoms.  No evidence of peritonsillar abscess at the time.  Was discharged home with clindamycin.  Return today because she has had worsening pain, difficulty swallowing.  States she is not able to tolerate any p.o.  She is still tolerating her secretions. Patient is afebril, non-toxic appearing, sitting comfortably on examination table. Vital signs reviewed and stable.  On exam, patient has uvula deviation to the left with peritonsillar swelling noted to the right side.  Initial labs ordered at triage.  BMP unremarkable.  CBC without any significant leukocytosis, anemia.  I-STAT beta negative.  Patient started on Decadron and IVF.  CT neck shows right peritonsillar abscess 11 x 14 mm.  She does have some mild adenopathy.  Given that this is her second  peritonsillar abscess and she is required drainage before, will plan to consult ENT.  Consult to ENT placed.   Discussed patient with Dr. Janace Hoard (ENT). Will come to the ED and drain it. Updated patient on plan.   Peritonsillar abscess drained by Dr. Janace Hoard here in the ED.  Per his recommend patient, will have patient continue on the clindamycin with plans to follow-up in the outpatient office.  Reevaluation after procedure.  Patient reports improvement in pain.  Will give analgesics here in the department.  Patient was able to tolerate water here in the ED without any difficulty.  Encourage patient to continue taking antibiotics as directed.  Instructed her to follow-up with ENT as directed. Patient had ample opportunity for questions and discussion. All patient's questions were answered with full understanding. Strict return precautions discussed. Patient expresses understanding and agreement to plan.    Final Clinical Impressions(s) / ED Diagnoses   Final diagnoses:  Peritonsillar abscess    ED Discharge Orders        Ordered    HYDROcodone-acetaminophen (NORCO/VICODIN) 5-325 MG tablet  Every 6 hours PRN     08/24/17 2207       Volanda Napoleon, PA-C 08/24/17 2220    Hayden Rasmussen, MD 08/26/17 1139

## 2017-08-30 ENCOUNTER — Ambulatory Visit (HOSPITAL_COMMUNITY)
Admission: EM | Admit: 2017-08-30 | Discharge: 2017-08-30 | Disposition: A | Discharge status: Home / Self Care | Payer: BC Managed Care – PPO | Attending: Family Medicine | Admitting: Family Medicine

## 2017-08-30 ENCOUNTER — Encounter (HOSPITAL_COMMUNITY): Payer: Self-pay | Admitting: Family Medicine

## 2017-08-30 DIAGNOSIS — J36 Peritonsillar abscess: Secondary | ICD-10-CM | POA: Diagnosis not present

## 2017-08-30 DIAGNOSIS — Z4889 Encounter for other specified surgical aftercare: Secondary | ICD-10-CM | POA: Diagnosis not present

## 2017-08-30 NOTE — ED Provider Notes (Signed)
[place]    CSN: [id number] Arrival date & time: 08/30/17  1314     History   Chief Complaint Chief Complaint  Patient presents with  . Follow-up    HPI Leslie [name] is a 22 y.o. female.   Patient had peritonsillar abscess drained 6 days ago by ENT physician who saw her in the emergency room.  She is much better.  Her voice has returned to normal.  She is finishing up course of clindamycin after surgery.  HPI  History reviewed. No pertinent past medical history.  Patient Active Problem List   Diagnosis Date Noted  . IUD strings lost 07/24/2017  . Pure hypercholesterolemia 06/12/2016    Past Surgical History:  Procedure Laterality Date  . CESAREAN SECTION  09/26/2013    OB History    Gravida  1   Para  1   Term  1   Preterm      AB      Living  1     SAB      TAB      Ectopic      Multiple      Live Births  1            Home Medications    Prior to Admission medications   Medication Sig Start Date End Date Taking? Authorizing Provider  clindamycin (CLEOCIN) 300 MG capsule Take 1 capsule (300 mg total) by mouth 3 (three) times daily. X 7 days 08/23/17   Ripley Fraise, MD  HYDROcodone-acetaminophen (NORCO/VICODIN) 5-325 MG tablet Take 1-2 tablets by mouth every 6 (six) hours as needed. 08/24/17   Volanda Napoleon, PA-C  naproxen (NAPROSYN) 500 MG tablet Take 1 tablet (500 mg total) by mouth 2 (two) times daily. 08/23/17   Ripley Fraise, MD    Family History Family History  Problem Relation Age of Onset  . Hypertension Mother   . Diabetes Maternal Grandmother   . Diabetes Maternal Grandfather     Social History Social History   Tobacco Use  . Smoking status: Never Smoker  . Smokeless tobacco: Never Used  Substance Use Topics  . Alcohol use: No  . Drug use: No     Allergies   Patient has no known allergies.   Review of Systems Review of Systems  Constitutional: Negative for chills and fever.  HENT:  Negative for ear pain and sore throat.   Eyes: Negative for pain and visual disturbance.  Respiratory: Negative for cough and shortness of breath.   Cardiovascular: Negative for chest pain and palpitations.  Gastrointestinal: Negative for abdominal pain and vomiting.  Genitourinary: Negative for dysuria and hematuria.  Musculoskeletal: Negative for arthralgias and back pain.  Skin: Negative for color change and rash.  Neurological: Negative for seizures and syncope.  All other systems reviewed and are negative.    Physical Exam Triage Vital Signs ED Triage Vitals  Enc Vitals Group     BP 08/30/17 1329 (!) 126/94     Pulse Rate 08/30/17 1329 85     Resp 08/30/17 1329 18     Temp --      Temp src --      SpO2 08/30/17 1329 100 %     Weight --      Height --      Head Circumference --      Peak Flow --      Pain Score 08/30/17 1328 0     Pain Loc --  Pain Edu? --      Excl. in [place]? --    No data found.  Updated Vital Signs BP (!) 126/94   Pulse 85   Resp 18   SpO2 100%   Visual Acuity Right Eye Distance:   Left Eye Distance:   Bilateral Distance:    Right Eye Near:   Left Eye Near:    Bilateral Near:     Physical Exam  Constitutional: She appears well-developed and well-nourished.  HENT:  Mouth/Throat: No oropharyngeal exudate.  Palpation of right tonsil reveals no fluctuance.  Incision is nearly healed.  Neck: Normal range of motion.     UC Treatments / Results  Labs (all labs ordered are listed, but only abnormal results are displayed) Labs Reviewed - No data to display  EKG None  Radiology No results found.  Procedures Procedures (including critical care time)  Medications Ordered in UC Medications - No data to display  Initial Impression / Assessment and Plan / UC Course  I have reviewed the triage vital signs and the nursing notes.  Pertinent labs & imaging results that were available during my care of the patient were reviewed by me  and considered in my medical decision making (see chart for details).     Status post I&D of right peritonsillar abscess Final Clinical Impressions(s) / UC Diagnoses   Final diagnoses:  None   Discharge Instructions   None    ED Prescriptions    None     Controlled Substance Prescriptions [place] Controlled Substance Registry consulted? No   Wardell Honour, MD 08/30/17 1339

## 2017-08-30 NOTE — ED Triage Notes (Signed)
Pt here for follow up on peritonsillar abscess. She had it drained in the ED last week. She just wants to ensure that it is better. She reports that she feels better.

## 2017-09-04 ENCOUNTER — Telehealth: Payer: Self-pay

## 2017-09-04 MED ORDER — MISOPROSTOL 200 MCG PO TABS: ORAL_TABLET | ORAL | 0 refills | Status: DC

## 2017-09-04 NOTE — Telephone Encounter (Signed)
Called patient to review pre procedure hysteroscopy plans.   Explained procedure at length with the patient and answered questions she had.  Informed patient we will send in Cytotec for her to take the night before her procedure (thurs may 30th). Also made patient aware she can take 800mg   ibuprofen the morning of her appointment with breakfast. Encourage patient to have someone drive her to and from appointment. Patient states understanding Kathrene Alu RN

## 2017-09-06 ENCOUNTER — Ambulatory Visit (INDEPENDENT_AMBULATORY_CARE_PROVIDER_SITE_OTHER): Payer: BC Managed Care – PPO | Admitting: Obstetrics & Gynecology

## 2017-09-06 ENCOUNTER — Encounter: Payer: Self-pay | Admitting: Obstetrics & Gynecology

## 2017-09-06 VITALS — BP 131/79 | HR 81 | Ht 68.0 in | Wt 280.0 lb

## 2017-09-06 DIAGNOSIS — Z3202 Encounter for pregnancy test, result negative: Secondary | ICD-10-CM

## 2017-09-06 DIAGNOSIS — T8332XD Displacement of intrauterine contraceptive device, subsequent encounter: Secondary | ICD-10-CM | POA: Diagnosis not present

## 2017-09-06 DIAGNOSIS — Z01812 Encounter for preprocedural laboratory examination: Secondary | ICD-10-CM

## 2017-09-06 DIAGNOSIS — Z3009 Encounter for other general counseling and advice on contraception: Secondary | ICD-10-CM

## 2017-09-06 LAB — POCT URINE PREGNANCY: Preg Test, Ur: NEGATIVE

## 2017-09-06 MED ORDER — KETOROLAC TROMETHAMINE 30 MG/ML IJ SOLN
30.0000 mg | Freq: Once | INTRAMUSCULAR | Status: AC
  Administered 2017-09-06: 30 mg via INTRAMUSCULAR

## 2017-09-06 MED ORDER — KETOROLAC TROMETHAMINE 30 MG/ML IJ SOLN: 30.0000 mg | Freq: Once | INTRAMUSCULAR | Status: DC

## 2017-09-06 MED ORDER — NORETHINDRONE ACET-ETHINYL EST 1.5-30 MG-MCG PO TABS: 1.0000 | ORAL_TABLET | Freq: Every day | ORAL | 11 refills | Status: DC

## 2017-09-06 NOTE — Progress Notes (Signed)
.  INDICATIONS: 22 y.o. G1P1001  here for scheduled surgery for retained IUD.  Pt reports fears of the IUD begin 'lost' she has also heard stories about it being embedded. She has had it in for 4 year. She wants to consider OCPs. Dr. Rip Harbour attempted to remove it in his office without success.   Risks of surgery were discussed with the patient including but not limited to: bleeding which may require transfusion; infection which may require antibiotics; injury to uterus or surrounding organs; intrauterine scarring which may impair future fertility; need for additional procedures including laparotomy or laparoscopy; and other postoperative/anesthesia complications. Written informed consent was obtained.    FINDINGS:  A small uterus.  Diffuse proliferative endometrium.  Normal ostia bilaterally.  ANESTHESIA:   General, paracervical block. FLUID DEFICITS:  <50cc ESTIMATED BLOOD LOSS:  Less than 10 ml SPECIMENS:IUD removed and discarded COMPLICATIONS:  None immediate.  PROCEDURE DETAILS:  The patient was taken to the procedure room where she received Toradol 10 mg IM. She took Cytotec 427mcg last night.  After an adequate timeout was performed, she was placed in the dorsal lithotomy position and examined; then prepped and draped in the sterile manner.  A speculum was then placed in the patient's vagina.  A paracervical block of 10cc of 2% lidocaine with epinephrine was placed with 5cc at both 5 and 7 o'clock.  A single tooth tenaculum was applied to the anterior lip of the cervix.   A 47mm hysteroscope was inserted under direct visualization using normal saline as a distending medium.  The uterine cavity was carefully examined, both ostia were recognized, and diffusely proliferative endometrium was noted.  The IUD strings were noted and the IUD was removed. There were no masses noted.  The tenaculum was removed from the anterior lip of the cervix and the vaginal speculum was removed after noting good hemostasis.   The patient tolerated the procedure well.   There were no immediate complications.  The patient will be discharged to home. Routine postoperative instructions given.  She was prescribed LoEstrin 1.5/30 1 po day.  She will follow up  in 6 weeks for postoperative evaluation.  Taygen Newsome L. Harraway-Smith, M.D., Cherlynn June

## 2017-09-06 NOTE — Patient Instructions (Signed)
Hysteroscopy, Care After  Refer to this sheet in the next few weeks. These instructions provide you with information on caring for yourself after your procedure. Your health care provider may also give you more specific instructions. Your treatment has been planned according to current medical practices, but problems sometimes occur. Call your health care provider if you have any problems or questions after your procedure.  What can I expect after the procedure?  After your procedure, it is typical to have the following:  · You may have some cramping. This normally lasts for a couple days.  · You may have bleeding. This can vary from light spotting for a few days to menstrual-like bleeding for 3-7 days.    Follow these instructions at home:  · Rest for the first 1-2 days after the procedure.  · Only take over-the-counter or prescription medicines as directed by your health care provider. Do not take aspirin. It can increase the chances of bleeding.  · Take showers instead of baths for 2 weeks or as directed by your health care provider.  · Do not drive for 24 hours or as directed.  · Do not drink alcohol while taking pain medicine.  · Do not use tampons, douche, or have sexual intercourse for 2 weeks or until your health care provider says it is okay.  · Take your temperature twice a day for 4-5 days. Write it down each time.  · Follow your health care provider's advice about diet, exercise, and lifting.  · If you develop constipation, you may:  ? Take a mild laxative if your health care provider approves.  ? Add bran foods to your diet.  ? Drink enough fluids to keep your urine clear or pale yellow.  · Try to have someone with you or available to you for the first 24-48 hours, especially if you were given a general anesthetic.  · Follow up with your health care provider as directed.  Contact a health care provider if:  · You feel dizzy or lightheaded.  · You feel sick to your stomach (nauseous).  · You have  abnormal vaginal discharge.  · You have a rash.  · You have pain that is not controlled with medicine.  Get help right away if:  · You have bleeding that is heavier than a normal menstrual period.  · You have a fever.  · You have increasing cramps or pain, not controlled with medicine.  · You have new belly (abdominal) pain.  · You pass out.  · You have pain in the tops of your shoulders (shoulder strap areas).  · You have shortness of breath.  This information is not intended to replace advice given to you by your health care provider. Make sure you discuss any questions you have with your health care provider.  Document Released: 01/14/2013 Document Revised: 09/01/2015 Document Reviewed: 10/23/2012  Elsevier Interactive Patient Education © 2017 [employer]

## 2017-09-06 NOTE — Progress Notes (Signed)
Patient has not had periods with IUD. Patient took the cytotec by mouth last night and didn't have any cramping or bleeding.

## 2017-10-22 ENCOUNTER — Encounter (INDEPENDENT_AMBULATORY_CARE_PROVIDER_SITE_OTHER): Payer: Self-pay | Admitting: Physician Assistant

## 2017-10-22 ENCOUNTER — Ambulatory Visit (INDEPENDENT_AMBULATORY_CARE_PROVIDER_SITE_OTHER): Payer: BC Managed Care – PPO | Admitting: Physician Assistant

## 2017-10-22 ENCOUNTER — Other Ambulatory Visit: Payer: Self-pay

## 2017-10-22 VITALS — BP 125/75 | HR 76 | Temp 98.3°F | Ht 68.0 in | Wt 282.2 lb

## 2017-10-22 DIAGNOSIS — Z114 Encounter for screening for human immunodeficiency virus [HIV]: Secondary | ICD-10-CM

## 2017-10-22 DIAGNOSIS — C000 Malignant neoplasm of external upper lip: Secondary | ICD-10-CM

## 2017-10-22 DIAGNOSIS — Z23 Encounter for immunization: Secondary | ICD-10-CM | POA: Diagnosis not present

## 2017-10-22 DIAGNOSIS — Z0000 Encounter for general adult medical examination without abnormal findings: Secondary | ICD-10-CM

## 2017-10-22 DIAGNOSIS — Z029 Encounter for administrative examinations, unspecified: Secondary | ICD-10-CM | POA: Diagnosis not present

## 2017-10-22 NOTE — Patient Instructions (Signed)
Td Vaccine (Tetanus and Diphtheria): What You Need to Know 1. Why get vaccinated? Tetanus  and diphtheria are very serious diseases. They are rare in the [place] today, but people who do become infected often have severe complications. Td vaccine is used to protect adolescents and adults from both of these diseases. Both tetanus and diphtheria are infections caused by bacteria. Diphtheria spreads from person to person through coughing or sneezing. Tetanus-causing bacteria enter the body through cuts, scratches, or wounds. TETANUS (lockjaw) causes painful muscle tightening and stiffness, usually all over the body.  It can lead to tightening of muscles in the head and neck so you can't open your mouth, swallow, or sometimes even breathe. Tetanus kills about 1 out of every 10 people who are infected even after receiving the best medical care.  DIPHTHERIA can cause a thick coating to form in the back of the throat.  It can lead to breathing problems, paralysis, heart failure, and death.  Before vaccines, as many as 200,000 cases of diphtheria and hundreds of cases of tetanus were reported in the [place] each year. Since vaccination began, reports of cases for both diseases have dropped by about 99%. 2. Td vaccine Td vaccine can protect adolescents and adults from tetanus and diphtheria. Td is usually given as a booster dose every 10 years but it can also be given earlier after a severe and dirty wound or burn. Another vaccine, called Tdap, which protects against pertussis in addition to tetanus and diphtheria, is sometimes recommended instead of Td vaccine. Your doctor or the person giving you the vaccine can give you more information. Td may safely be given at the same time as other vaccines. 3. Some people should not get this vaccine  A person who has ever had a life-threatening allergic reaction after a previous dose of any tetanus or diphtheria containing vaccine, OR has a severe  allergy to any part of this vaccine, should not get Td vaccine. Tell the person giving the vaccine about any severe allergies.  Talk to your doctor if you: ? had severe pain or swelling after any vaccine containing diphtheria or tetanus, ? ever had a condition called Guillain Barre Syndrome (GBS), ? aren't feeling well on the day the shot is scheduled. 4. What are the risks from Td vaccine? With any medicine, including vaccines, there is a chance of side effects. These are usually mild and go away on their own. Serious reactions are also possible but are rare. Most people who get Td vaccine do not have any problems with it. Mild problems following Td vaccine: (Did not interfere with activities)  Pain where the shot was given (about 8 people in 10)  Redness or swelling where the shot was given (about 1 person in 4)  Mild fever (rare)  Headache (about 1 person in 4)  Tiredness (about 1 person in 4)  Moderate problems following Td vaccine: (Interfered with activities, but did not require medical attention)  Fever over 102F (rare)  Severe problems following Td vaccine: (Unable to perform usual activities; required medical attention)  Swelling, severe pain, bleeding and/or redness in the arm where the shot was given (rare).  Problems that could happen after any vaccine:  People sometimes faint after a medical procedure, including vaccination. Sitting or lying down for about 15 minutes can help prevent fainting, and injuries caused by a fall. Tell your doctor if you feel dizzy, or have vision changes or ringing in the ears.  Some people get   severe pain in the shoulder and have difficulty moving the arm where a shot was given. This happens very rarely.  Any medication can cause a severe allergic reaction. Such reactions from a vaccine are very rare, estimated at fewer than 1 in a million doses, and would happen within a few minutes to a few hours after the vaccination. As with any  medicine, there is a very remote chance of a vaccine causing a serious injury or death. The safety of vaccines is always being monitored. For more information, visit: www.cdc.gov/vaccinesafety/ 5. What if there is a serious reaction? What should I look for? Look for anything that concerns you, such as signs of a severe allergic reaction, very high fever, or unusual behavior. Signs of a severe allergic reaction can include hives, swelling of the face and throat, difficulty breathing, a fast heartbeat, dizziness, and weakness. These would usually start a few minutes to a few hours after the vaccination. What should I do?  If you think it is a severe allergic reaction or other emergency that can't wait, call 9-1-1 or get the person to the nearest hospital. Otherwise, call your doctor.  Afterward, the reaction should be reported to the Vaccine Adverse Event Reporting System (VAERS). Your doctor might file this report, or you can do it yourself through the VAERS web site at www.vaers.hhs.gov, or by calling 1-800-822-7967. ? VAERS does not give medical advice. 6. The National Vaccine Injury Compensation Program The National Vaccine Injury Compensation Program (VICP) is a federal program that was created to compensate people who may have been injured by certain vaccines. Persons who believe they may have been injured by a vaccine can learn about the program and about filing a claim by calling 1-800-338-2382 or visiting the VICP website at www.hrsa.gov/vaccinecompensation. There is a time limit to file a claim for compensation. 7. How can I learn more?  Ask your doctor. He or she can give you the vaccine package insert or suggest other sources of information.  Call your local or state health department.  Contact the Centers for Disease Control and Prevention (CDC): ? Call 1-800-232-4636 (1-800-CDC-INFO) ? Visit CDC's website at www.cdc.gov/vaccines CDC Td Vaccine VIS (07/19/15) This information is  not intended to replace advice given to you by your health care provider. Make sure you discuss any questions you have with your health care provider. Document Released: 01/21/2006 Document Revised: 12/15/2015 Document Reviewed: 12/15/2015 Elsevier Interactive Patient Education  2017 Elsevier Inc.  

## 2017-10-22 NOTE — Progress Notes (Signed)
Subjective:  Patient ID: Leslie [name], female    DOB: 09/15/1995  Age: 22 y.o. MRN: [id number]  CC: annual exam  HPI Kialee Clarkis a 22 y.o.femalewith a PMH of HLD and tonsillar abscess presents for an annual exam. Needs a physical for a job she is applying to. Needs "Staff Health Assessment/Medical Report" to be filled out for her. Says she is feeling well and has no complaints or symptoms.     Outpatient Medications Prior to Visit  Medication Sig Dispense Refill  . clindamycin (CLEOCIN) 300 MG capsule Take 1 capsule (300 mg total) by mouth 3 (three) times daily. X 7 days (Patient not taking: Reported on 09/06/2017) 21 capsule 0  . HYDROcodone-acetaminophen (NORCO/VICODIN) 5-325 MG tablet Take 1-2 tablets by mouth every 6 (six) hours as needed. (Patient not taking: Reported on 09/06/2017) 6 tablet 0  . misoprostol (CYTOTEC) 200 MCG tablet Take two tablet by mouth the night before procedure. 2 tablet 0  . naproxen (NAPROSYN) 500 MG tablet Take 1 tablet (500 mg total) by mouth 2 (two) times daily. (Patient not taking: Reported on 09/06/2017) 14 tablet 0  . Norethindrone Acetate-Ethinyl Estradiol (JUNEL,LOESTRIN,MICROGESTIN) 1.5-30 MG-MCG tablet Take 1 tablet by mouth daily. 1 Package 11   No facility-administered medications prior to visit.      ROS Review of Systems  Constitutional: Negative for chills, fever and malaise/fatigue.  Eyes: Negative for blurred vision.  Respiratory: Negative for shortness of breath.   Cardiovascular: Negative for chest pain and palpitations.  Gastrointestinal: Negative for abdominal pain and nausea.  Genitourinary: Negative for dysuria and hematuria.  Musculoskeletal: Negative for joint pain and myalgias.  Skin: Negative for rash.  Neurological: Negative for tingling and headaches.  Psychiatric/Behavioral: Negative for depression. The patient is not nervous/anxious.     Objective:  Ht 5\' 8"  (1.727 m)   Wt 282 lb 3.2 oz (128 kg)   BMI 42.91 kg/m    Vitals:   10/22/17 1350  BP: 125/75  Pulse: 76  Temp: 98.3 F (36.8 C)  TempSrc: Oral  SpO2: 97%  Weight: 282 lb 3.2 oz (128 kg)  Height: 5\' 8"  (1.727 m)      Physical Exam  Constitutional: She is oriented to person, place, and time.  Well developed, obese, NAD, polite  HENT:  Head: Normocephalic and atraumatic.  Mouth/Throat: No oropharyngeal exudate.  Eyes: Pupils are equal, round, and reactive to light. Conjunctivae and EOM are normal. No scleral icterus.  Neck: Normal range of motion. Neck supple. No thyromegaly present.  Cardiovascular: Normal rate, regular rhythm and normal heart sounds.  Pulmonary/Chest: Effort normal and breath sounds normal.  Abdominal: Soft. Bowel sounds are normal. There is no tenderness.  Musculoskeletal: She exhibits no edema.  Full aROM of the LEs, UEs, and back  Lymphadenopathy:    She has no cervical adenopathy.  Neurological: She is alert and oriented to person, place, and time. No cranial nerve deficit. Coordination normal.  Strength 5/5 throughout. Light touch sensation intact  Skin: Skin is warm and dry. No rash noted. No erythema. No pallor.  Psychiatric: She has a normal mood and affect. Her behavior is normal. Thought content normal.  Vitals reviewed.    Assessment & Plan:   1. Screening for HIV (human immunodeficiency virus) - HIV antibody  2. Annual physical exam - Comprehensive metabolic panel  3. Need for Tdap vaccination - Tdap vaccine greater than or equal to 7yo IM  4. Administrative encounter - Pt's physical exam form filled out  Follow-up: Return if symptoms worsen or fail to improve.   Clent Demark PA

## 2017-10-23 LAB — COMPREHENSIVE METABOLIC PANEL
A/G RATIO: 1.2 (ref 1.2–2.2)
ALT: 47 IU/L — AB (ref 0–32)
AST: 22 IU/L (ref 0–40)
Albumin: 3.8 g/dL (ref 3.5–5.5)
Alkaline Phosphatase: 79 IU/L (ref 39–117)
BUN/Creatinine Ratio: 16 (ref 9–23)
BUN: 13 mg/dL (ref 6–20)
CALCIUM: 9 mg/dL (ref 8.7–10.2)
CHLORIDE: 106 mmol/L (ref 96–106)
CO2: 20 mmol/L (ref 20–29)
Creatinine, Ser: 0.83 mg/dL (ref 0.57–1.00)
GFR calc Af Amer: 114 mL/min/{1.73_m2} (ref >59)
GFR calc non Af Amer: 99 mL/min/{1.73_m2} (ref >59)
GLUCOSE: 97 mg/dL (ref 65–99)
Globulin, Total: 3.2 g/dL (ref 1.5–4.5)
POTASSIUM: 4.5 mmol/L (ref 3.5–5.2)
Sodium: 141 mmol/L (ref 134–144)
TOTAL PROTEIN: 7 g/dL (ref 6.0–8.5)

## 2017-10-23 LAB — HIV ANTIBODY (ROUTINE TESTING W REFLEX): HIV Screen 4th Generation wRfx: NONREACTIVE

## 2017-10-25 ENCOUNTER — Telehealth (INDEPENDENT_AMBULATORY_CARE_PROVIDER_SITE_OTHER): Payer: Self-pay

## 2017-10-25 NOTE — Telephone Encounter (Signed)
Left voicemail asking patient to call the office. Nat Christen, CMA

## 2017-10-25 NOTE — Telephone Encounter (Signed)
-----   Message from Clent Demark, PA-C sent at 10/23/2017  1:33 PM EDT ----- Very mild liver inflammation. Could be due to fatty or fried foods. Will monitor in a future appointment. HIV negative.

## 2017-10-28 ENCOUNTER — Telehealth (INDEPENDENT_AMBULATORY_CARE_PROVIDER_SITE_OTHER): Payer: Self-pay

## 2017-10-28 NOTE — Telephone Encounter (Signed)
Patient returned call to clinic and was provided with results of negative HIV and very mild liver inflammation likely due to fatty or fried foods. She is aware that we will monitor liver at a future appointment. Nat Christen, CMA

## 2017-10-28 NOTE — Telephone Encounter (Signed)
-----   Message from Clent Demark, PA-C sent at 10/23/2017  1:33 PM EDT ----- Very mild liver inflammation. Could be due to fatty or fried foods. Will monitor in a future appointment. HIV negative.

## 2017-10-30 ENCOUNTER — Encounter (HOSPITAL_COMMUNITY): Payer: Self-pay | Admitting: *Deleted

## 2017-10-30 ENCOUNTER — Emergency Department (HOSPITAL_COMMUNITY)
Admission: EM | Admit: 2017-10-30 | Discharge: 2017-10-30 | Disposition: A | Discharge status: Home / Self Care | Payer: BC Managed Care – PPO | Attending: Emergency Medicine | Admitting: Emergency Medicine

## 2017-10-30 ENCOUNTER — Emergency Department (HOSPITAL_COMMUNITY): Payer: BC Managed Care – PPO

## 2017-10-30 DIAGNOSIS — J020 Streptococcal pharyngitis: Secondary | ICD-10-CM | POA: Diagnosis not present

## 2017-10-30 DIAGNOSIS — J029 Acute pharyngitis, unspecified: Secondary | ICD-10-CM | POA: Diagnosis present

## 2017-10-30 LAB — CBC WITH DIFFERENTIAL/PLATELET
BASOS ABS: 0 10*3/uL (ref 0.0–0.1)
Basophils Relative: 0 %
EOS ABS: 0.3 10*3/uL (ref 0.0–0.7)
Eosinophils Relative: 3 %
HCT: 39 % (ref 36.0–46.0)
HEMOGLOBIN: 12.6 g/dL (ref 12.0–15.0)
Lymphocytes Relative: 21 %
Lymphs Abs: 1.8 10*3/uL (ref 0.7–4.0)
MCH: 28.3 pg (ref 26.0–34.0)
MCHC: 32.3 g/dL (ref 30.0–36.0)
MCV: 87.6 fL (ref 78.0–100.0)
Monocytes Absolute: 0.5 10*3/uL (ref 0.1–1.0)
Monocytes Relative: 5 %
NEUTROS PCT: 71 %
Neutro Abs: 6.1 10*3/uL (ref 1.7–7.7)
Platelets: 298 10*3/uL (ref 150–400)
RBC: 4.45 MIL/uL (ref 3.87–5.11)
RDW: 13.9 % (ref 11.5–15.5)
WBC: 8.7 10*3/uL (ref 4.0–10.5)

## 2017-10-30 LAB — BASIC METABOLIC PANEL
Anion gap: 7 (ref 5–15)
BUN: 15 mg/dL (ref 6–20)
CHLORIDE: 109 mmol/L (ref 98–111)
CO2: 25 mmol/L (ref 22–32)
CREATININE: 0.88 mg/dL (ref 0.44–1.00)
Calcium: 8.9 mg/dL (ref 8.9–10.3)
Glucose, Bld: 114 mg/dL — ABNORMAL HIGH (ref 70–99)
POTASSIUM: 3.9 mmol/L (ref 3.5–5.1)
SODIUM: 141 mmol/L (ref 135–145)

## 2017-10-30 LAB — I-STAT BETA HCG BLOOD, ED (MC, WL, AP ONLY): I-stat hCG, quantitative: <5 m[IU]/mL (ref <5)

## 2017-10-30 LAB — GROUP A STREP BY PCR: GROUP A STREP BY PCR: DETECTED — AB

## 2017-10-30 MED ORDER — ACETAMINOPHEN 500 MG PO TABS
1000.0000 mg | ORAL_TABLET | Freq: Once | ORAL | Status: AC
  Administered 2017-10-30: 1000 mg via ORAL
  Filled 2017-10-30: qty 2

## 2017-10-30 MED ORDER — CLINDAMYCIN HCL 300 MG PO CAPS
300.0000 mg | ORAL_CAPSULE | Freq: Once | ORAL | Status: AC
  Administered 2017-10-30: 300 mg via ORAL
  Filled 2017-10-30: qty 1

## 2017-10-30 MED ORDER — IOHEXOL 300 MG/ML  SOLN
75.0000 mL | Freq: Once | INTRAMUSCULAR | Status: AC | PRN
  Administered 2017-10-30: 75 mL via INTRAVENOUS

## 2017-10-30 MED ORDER — DEXAMETHASONE SODIUM PHOSPHATE 10 MG/ML IJ SOLN
10.0000 mg | Freq: Once | INTRAMUSCULAR | Status: AC
  Administered 2017-10-30: 10 mg via INTRAVENOUS
  Filled 2017-10-30: qty 1

## 2017-10-30 MED ORDER — CLINDAMYCIN HCL 150 MG PO CAPS: 300.0000 mg | ORAL_CAPSULE | Freq: Three times a day (TID) | ORAL | 0 refills | Status: AC

## 2017-10-30 MED ORDER — SODIUM CHLORIDE 0.9 % IV BOLUS
1000.0000 mL | Freq: Once | INTRAVENOUS | Status: AC
  Administered 2017-10-30: 1000 mL via INTRAVENOUS

## 2017-10-30 NOTE — Discharge Instructions (Addendum)

## 2017-10-30 NOTE — ED Triage Notes (Signed)
Pt w/ hx of peritonsillar abscess complains of swollen tonsils and difficulty breathing since last night. Pt tried naproxen w/o relief.

## 2017-10-30 NOTE — ED Provider Notes (Signed)
[facility] DEPT Provider Note   CSN: [id number] Arrival date & time: 10/30/17  1741     History   Chief Complaint Chief Complaint  Patient presents with  . Sore Throat    hx peritonsillar abscess    HPI Leslie [name] is a 22 y.o. female with a past medical history of peritonsillar abscess x2 who presents today complaining of swollen tonsils, and shortness of breath.  She reports that this started last night and feels similar to her previous peritonsillar abscesses.  She tried naproxen approximately 1 hour ago without significant relief.  She denies any fevers, no nausea vomiting or diarrhea.    HPI  History reviewed. No pertinent past medical history.  Patient Active Problem List   Diagnosis Date Noted  . IUD strings lost 07/24/2017  . Pure hypercholesterolemia 06/12/2016    Past Surgical History:  Procedure Laterality Date  . CESAREAN SECTION  09/26/2013     OB History    Gravida  1   Para  1   Term  1   Preterm      AB      Living  1     SAB      TAB      Ectopic      Multiple      Live Births  1            Home Medications    Prior to Admission medications   Medication Sig Start Date End Date Taking? Authorizing Provider  naproxen (NAPROSYN) 500 MG tablet Take 500 mg by mouth 2 (two) times daily as needed (sore throat).   Yes [provider]  Norethindrone Acetate-Ethinyl Estradiol (JUNEL,LOESTRIN,MICROGESTIN) 1.5-30 MG-MCG tablet Take 1 tablet by mouth daily. 09/06/17  Yes Lavonia Drafts, MD  clindamycin (CLEOCIN) 150 MG capsule Take 2 capsules (300 mg total) by mouth 3 (three) times daily for 10 days. 10/30/17 11/09/17  Lorin Glass, PA-C    Family History Family History  Problem Relation Age of Onset  . Hypertension Mother   . Diabetes Maternal Grandmother   . Diabetes Maternal Grandfather     Social History Social History   Tobacco Use  . Smoking status: Never Smoker  .  Smokeless tobacco: Never Used  Substance Use Topics  . Alcohol use: No  . Drug use: No     Allergies   Patient has no known allergies.   Review of Systems Review of Systems  Constitutional: Negative for chills and fever.  HENT: Positive for drooling, sore throat, trouble swallowing and voice change. Negative for dental problem, ear discharge, ear pain, postnasal drip, sinus pressure, sneezing and tinnitus.   Respiratory: Negative for chest tightness and shortness of breath.   Gastrointestinal: Negative for nausea and vomiting.  All other systems reviewed and are negative.    Physical Exam Updated Vital Signs BP (!) 146/78   Pulse 91   Temp 98.1 F (36.7 C) (Oral)   Resp 20   LMP 10/30/2017   SpO2 97%   Physical Exam  Constitutional: She appears well-developed and well-nourished. No distress.  HENT:  Head: Normocephalic and atraumatic.  Right Ear: Hearing, tympanic membrane and ear canal normal.  Left Ear: Hearing, tympanic membrane and ear canal normal.  Mouth/Throat: Mucous membranes are normal. No uvula swelling. Posterior oropharyngeal edema, posterior oropharyngeal erythema and tonsillar abscesses present. Tonsils are 2+ on the right. Tonsils are 3+ on the left. Tonsillar exudate.  Eyes: Conjunctivae are normal. Right eye exhibits  no discharge. Left eye exhibits no discharge. No scleral icterus.  Neck: Normal range of motion. Neck supple.  Cardiovascular: Normal rate, regular rhythm and normal heart sounds.  No murmur heard. Pulmonary/Chest: Effort normal and breath sounds normal. No stridor. No respiratory distress.  Abdominal: Soft. Bowel sounds are normal. She exhibits no distension.  Musculoskeletal: She exhibits no edema or deformity.  Lymphadenopathy:    She has no cervical adenopathy.  Neurological: She is alert. She exhibits normal muscle tone.  Skin: Skin is warm and dry. She is not diaphoretic.  Psychiatric: She has a normal mood and affect. Her behavior  is normal.  Nursing note and vitals reviewed.    ED Treatments / Results  Labs (all labs ordered are listed, but only abnormal results are displayed) Labs Reviewed  GROUP A STREP BY PCR - Abnormal; Notable for the following components:      Result Value   Group A Strep by PCR DETECTED (*)    All other components within normal limits  BASIC METABOLIC PANEL - Abnormal; Notable for the following components:   Glucose, Bld 114 (*)    All other components within normal limits  CBC WITH DIFFERENTIAL/PLATELET  I-STAT BETA HCG BLOOD, ED ([facility], [facility], [facility] ONLY)    EKG None  Radiology Ct Soft Tissue Neck W Contrast  Result Date: 10/30/2017 CLINICAL DATA:  Sore throat, tonsillar swelling beginning last night with difficulty breathing. Assess peritonsillar abscess. History of peritonsillar abscess. EXAM: CT NECK WITH CONTRAST TECHNIQUE: Multidetector CT imaging of the neck was performed using the standard protocol following the bolus administration of intravenous contrast. CONTRAST:  70mL OMNIPAQUE IOHEXOL 300 MG/ML  SOLN COMPARISON:  CT neck Aug 24, 2017. FINDINGS: Large body habitus results in overall noisy image quality. PHARYNX AND LARYNX: Asymmetrically enlarged RIGHT palatine tonsil without focal fluid collection. Preservation of the parapharyngeal fat planes. Prominent adenoidal soft tissues. Patent airway. Normal epiglottis. Normal larynx. SALIVARY GLANDS: Normal. THYROID: Normal. LYMPH NODES: Similar mild cervical lymphadenopathy measuring to 15 mm short axis LEFT level IIa. VASCULAR: Normal. LIMITED INTRACRANIAL: Normal. VISUALIZED ORBITS: Normal. MASTOIDS AND VISUALIZED PARANASAL SINUSES: Mild paranasal sinus mucosal thickening. Mastoid air cells are well aerated. SKELETON: Nonacute. No CT findings of dental pathology. Calcified stylohyoid ligament. UPPER CHEST: Lung apices are clear. No superior mediastinal lymphadenopathy. OTHER: None. IMPRESSION: 1. Mild asymmetric enlargement RIGHT palatine  tonsil without acute tonsillitis or abscess. Patent airway. 2. Mild reactive cervical lymphadenopathy. Electronically Signed   By: Elon Alas M.D.   On: 10/30/2017 20:25    Procedures Procedures (including critical care time)  Medications Ordered in ED Medications  sodium chloride 0.9 % bolus 1,000 mL (0 mLs Intravenous Stopped 10/30/17 2043)  dexamethasone (DECADRON) injection 10 mg (10 mg Intravenous Given 10/30/17 1905)  acetaminophen (TYLENOL) tablet 1,000 mg (1,000 mg Oral Given 10/30/17 1904)  iohexol (OMNIPAQUE) 300 MG/ML solution 75 mL (75 mLs Intravenous Contrast Given 10/30/17 1956)  clindamycin (CLEOCIN) capsule 300 mg (300 mg Oral Given 10/30/17 2053)     Initial Impression / Assessment and Plan / ED Course  I have reviewed the triage vital signs and the nursing notes.  Pertinent labs & imaging results that were available during my care of the patient were reviewed by me and considered in my medical decision making (see chart for details).    Patient presents today for evaluation of sore throat.  She has a history of multiple previous tonsillar abscesses.  Left tonsil is enlarged when compared to right.  Rapid strep  positive.  Labs were obtained and interpreted.  She does not have any leukocytosis nor anemia.  She does not have any electrolyte abnormalities.  Pregnancy test interpreted as negative.  Group a strep by PCR detected consistent with strep throat.  Based on patient's asymmetrical tonsillar enlargement with history of recurrent peritonsillar abscesses discussed with patient possible treatment options and evaluation.  Shared decision-making was made and CT neck soft tissue was ordered.  This shows an enlarged right-sided palatine compared to left which is contradictory to my exam.  CT scan is not consistent with tonsillar abscess.    Patient was treated with IV fluids, IV Decadron, and Tylenol 1 9 department after which she reported feeling better.  She was given a dose  of clindamycin p.o. and discharged with a prescription for continued clindamycin.  IM penicillin was considered, however based on patient's history and physical exam concern that without aggressive antibiotic treatment this could develop into a tonsillar abscess.  Return precautions were discussed with patient he states her understanding.  Patient discharged home.   Final Clinical Impressions(s) / ED Diagnoses   Final diagnoses:  Strep pharyngitis    ED Discharge Orders        Ordered    clindamycin (CLEOCIN) 150 MG capsule  3 times daily     10/30/17 2040       Lorin Glass, [place] 10/30/17 2248    Malvin Johns, MD 10/30/17 2321

## 2017-11-01 ENCOUNTER — Encounter (HOSPITAL_COMMUNITY): Payer: Self-pay

## 2017-11-01 ENCOUNTER — Emergency Department (HOSPITAL_COMMUNITY): Payer: BC Managed Care – PPO

## 2017-11-01 ENCOUNTER — Other Ambulatory Visit: Payer: Self-pay

## 2017-11-01 ENCOUNTER — Emergency Department (HOSPITAL_COMMUNITY)
Admission: EM | Admit: 2017-11-01 | Discharge: 2017-11-01 | Disposition: A | Discharge status: Home / Self Care | Payer: BC Managed Care – PPO | Attending: Emergency Medicine | Admitting: Emergency Medicine

## 2017-11-01 DIAGNOSIS — Z79899 Other long term (current) drug therapy: Secondary | ICD-10-CM | POA: Diagnosis not present

## 2017-11-01 DIAGNOSIS — R070 Pain in throat: Secondary | ICD-10-CM | POA: Diagnosis present

## 2017-11-01 DIAGNOSIS — J36 Peritonsillar abscess: Secondary | ICD-10-CM | POA: Insufficient documentation

## 2017-11-01 LAB — CBC WITH DIFFERENTIAL/PLATELET
Basophils Absolute: 0 10*3/uL (ref 0.0–0.1)
Basophils Relative: 0 %
EOS ABS: 0 10*3/uL (ref 0.0–0.7)
Eosinophils Relative: 0 %
HEMATOCRIT: 38.9 % (ref 36.0–46.0)
HEMOGLOBIN: 12.6 g/dL (ref 12.0–15.0)
LYMPHS ABS: 2.1 10*3/uL (ref 0.7–4.0)
LYMPHS PCT: 21 %
MCH: 28.4 pg (ref 26.0–34.0)
MCHC: 32.4 g/dL (ref 30.0–36.0)
MCV: 87.6 fL (ref 78.0–100.0)
MONOS PCT: 6 %
Monocytes Absolute: 0.6 10*3/uL (ref 0.1–1.0)
NEUTROS ABS: 7.3 10*3/uL (ref 1.7–7.7)
NEUTROS PCT: 73 %
Platelets: 311 10*3/uL (ref 150–400)
RBC: 4.44 MIL/uL (ref 3.87–5.11)
RDW: 14 % (ref 11.5–15.5)
WBC: 9.9 10*3/uL (ref 4.0–10.5)

## 2017-11-01 LAB — COMPREHENSIVE METABOLIC PANEL
ALT: 25 U/L (ref 0–44)
ANION GAP: 4 — AB (ref 5–15)
AST: 15 U/L (ref 15–41)
Albumin: 3.3 g/dL — ABNORMAL LOW (ref 3.5–5.0)
Alkaline Phosphatase: 70 U/L (ref 38–126)
BUN: 13 mg/dL (ref 6–20)
CHLORIDE: 110 mmol/L (ref 98–111)
CO2: 26 mmol/L (ref 22–32)
CREATININE: 0.74 mg/dL (ref 0.44–1.00)
Calcium: 9 mg/dL (ref 8.9–10.3)
Glucose, Bld: 126 mg/dL — ABNORMAL HIGH (ref 70–99)
POTASSIUM: 3.6 mmol/L (ref 3.5–5.1)
SODIUM: 140 mmol/L (ref 135–145)
Total Bilirubin: 0.2 mg/dL — ABNORMAL LOW (ref 0.3–1.2)
Total Protein: 7.3 g/dL (ref 6.5–8.1)

## 2017-11-01 MED ORDER — LIDOCAINE-EPINEPHRINE (PF) 2 %-1:200000 IJ SOLN
INTRAMUSCULAR | Status: AC
  Filled 2017-11-01: qty 20

## 2017-11-01 MED ORDER — MORPHINE SULFATE (PF) 4 MG/ML IV SOLN
4.0000 mg | Freq: Once | INTRAVENOUS | Status: AC
  Administered 2017-11-01: 4 mg via INTRAVENOUS
  Filled 2017-11-01: qty 1

## 2017-11-01 MED ORDER — IOPAMIDOL (ISOVUE-300) INJECTION 61%
75.0000 mL | Freq: Once | INTRAVENOUS | Status: AC | PRN
  Administered 2017-11-01: 75 mL via INTRAVENOUS

## 2017-11-01 MED ORDER — DEXAMETHASONE SODIUM PHOSPHATE 10 MG/ML IJ SOLN
10.0000 mg | Freq: Once | INTRAMUSCULAR | Status: AC
  Administered 2017-11-01: 10 mg via INTRAVENOUS
  Filled 2017-11-01: qty 1

## 2017-11-01 MED ORDER — SODIUM CHLORIDE 0.9 % IV BOLUS
1000.0000 mL | Freq: Once | INTRAVENOUS | Status: AC
  Administered 2017-11-01: 1000 mL via INTRAVENOUS

## 2017-11-01 MED ORDER — HYDROGEN PEROXIDE 3 % EX SOLN
CUTANEOUS | Status: AC
  Filled 2017-11-01: qty 473

## 2017-11-01 MED ORDER — LIDOCAINE HCL (PF) 1 % IJ SOLN
INTRAMUSCULAR | Status: AC
  Filled 2017-11-01: qty 30

## 2017-11-01 NOTE — ED Triage Notes (Addendum)
Pt coming form home c/o right neck swelling with difficulty swallowing. States "she thinks there is an abscess that is making it difficult to breathe and swallow". Hx of peritonsillar abscess. Was seen on Wednesday for what was believed to be strep and was told to come back if symptoms did not get better.   Pt talking in full complete sentences

## 2017-11-01 NOTE — ED Notes (Signed)
Patient verbalized understanding of discharge instructions, no questions. Patient ambulated out of ED with steady gait in no distress.  

## 2017-11-01 NOTE — Discharge Instructions (Addendum)
Please continue with clindamycin therapy.Please follow up with ENT in 1 week. If you experience any of the following symptoms, please return to the ED:  You have difficulty talking or breathing, or you find it easier to breathe when you lean forward. You are coughing up blood or vomiting blood. You have severe throat pain that is not helped by medicines. You start to drool.

## 2017-11-01 NOTE — ED Provider Notes (Signed)
[facility] DEPT Provider Note   CSN: [id number] Arrival date & time: 11/01/17  0546     History   Chief Complaint Chief Complaint  Patient presents with  . Sore Throat    and neck swelling    HPI Leslie [name] is a 22 y.o. female.  22 year old female with a past medical history of tonsillar abscess x 2 presents to the ED with worsening right sided neck pain since last night.  Patient was seen at [facility], ED two days ago and diagnosed with strep throat, she was placed on clindamycin and has been complaint with medication.She states the clindamycin has helped bring the left tonsil down but she feels the right tonsil is getting worse. She reports difficulty swallowing liquids and solids. Patient has taken some naproxen but states no relieve in symptoms. She denies any fever, nausea, vomiting or abdominal.She does however report some diarrhea since starting the clindamycin.      History reviewed. No pertinent past medical history.  Patient Active Problem List   Diagnosis Date Noted  . IUD strings lost 07/24/2017  . Pure hypercholesterolemia 06/12/2016    Past Surgical History:  Procedure Laterality Date  . CESAREAN SECTION  09/26/2013     OB History    Gravida  1   Para  1   Term  1   Preterm      AB      Living  1     SAB      TAB      Ectopic      Multiple      Live Births  1            Home Medications    Prior to Admission medications   Medication Sig Start Date End Date Taking? Authorizing Provider  clindamycin (CLEOCIN) 150 MG capsule Take 2 capsules (300 mg total) by mouth 3 (three) times daily for 10 days. 10/30/17 11/09/17 Yes Lorin Glass, PA-C  naproxen (NAPROSYN) 500 MG tablet Take 500 mg by mouth 2 (two) times daily as needed (sore throat).   Yes [provider]  Norethindrone Acetate-Ethinyl Estradiol (JUNEL,LOESTRIN,MICROGESTIN) 1.5-30 MG-MCG tablet Take 1 tablet by mouth daily.  09/06/17  Yes Lavonia Drafts, MD    Family History Family History  Problem Relation Age of Onset  . Hypertension Mother   . Diabetes Maternal Grandmother   . Diabetes Maternal Grandfather     Social History Social History   Tobacco Use  . Smoking status: Never Smoker  . Smokeless tobacco: Never Used  Substance Use Topics  . Alcohol use: No  . Drug use: No     Allergies   Patient has no known allergies.   Review of Systems Review of Systems  Constitutional: Negative for chills and fever.  HENT: Positive for sore throat and trouble swallowing. Negative for rhinorrhea, sneezing and voice change.   Eyes: Positive for redness.  Respiratory: Negative for shortness of breath and wheezing.   Cardiovascular: Negative for chest pain and palpitations.  Gastrointestinal: Negative for abdominal pain, diarrhea, nausea and vomiting.  Genitourinary: Negative for dysuria, flank pain and hematuria.  Musculoskeletal: Negative for back pain.  Skin: Negative for color change and rash.  Neurological: Negative for syncope, light-headedness and headaches.  All other systems reviewed and are negative.    Physical Exam Updated Vital Signs BP (!) 147/89 (BP Location: Left Arm)   Pulse 75   Temp 97.7 F (36.5 C) (Oral)   Resp 15  Ht 5\' 8"  (1.727 m)   Wt 127.9 kg (282 lb)   LMP 10/30/2017 Comment: negative beta HCG 10/30/17  SpO2 100%   BMI 42.88 kg/m   Physical Exam  Constitutional: She appears well-developed and well-nourished.  HENT:  Head: Normocephalic and atraumatic.  Mouth/Throat: Uvula is midline. Posterior oropharyngeal edema and posterior oropharyngeal erythema present. No oropharyngeal exudate or tonsillar abscesses. Tonsils are 3+ on the right. Tonsils are 2+ on the left.  Eyes: Pupils are equal, round, and reactive to light.  Neck: Normal range of motion.  Cardiovascular: Normal rate.  Abdominal: Soft.  Skin: Skin is warm and dry. Capillary refill takes less  than 2 seconds. No rash noted. No erythema.  Nursing note and vitals reviewed.    ED Treatments / Results  Labs (all labs ordered are listed, but only abnormal results are displayed) Labs Reviewed  COMPREHENSIVE METABOLIC PANEL - Abnormal; Notable for the following components:      Result Value   Glucose, Bld 126 (*)    Albumin 3.3 (*)    Total Bilirubin 0.2 (*)    Anion gap 4 (*)    All other components within normal limits  CBC WITH DIFFERENTIAL/PLATELET    EKG None  Radiology Ct Soft Tissue Neck W Contrast  Result Date: 11/01/2017 CLINICAL DATA:  Sore throat/stridor, epiglottitis or tonsillitis suspected. EXAM: CT NECK WITH CONTRAST TECHNIQUE: Multidetector CT imaging of the neck was performed using the standard protocol following the bolus administration of intravenous contrast. CONTRAST:  57mL ISOVUE-300 IOPAMIDOL (ISOVUE-300) INJECTION 61% COMPARISON:  CT neck 10/30/2017. FINDINGS: Pharynx and larynx: No focal mucosal or submucosal lesions are present. Palatine tonsils are enlarged bilaterally. A hypodense region along the lateral aspect of the right palatine tonsil measures 17 x 18 x 15 mm. There is asymmetric stranding in the right parapharyngeal fat. Adenoid tissue is enlarged. Lingual tonsils are prominent without a discrete mass. The hypopharynx is clear. Vocal cords are midline and symmetric. Salivary glands: The submandibular and parotid glands are within normal limits bilaterally. Thyroid: Normal. Lymph nodes: Enlarged level 2 lymph nodes are similar the prior exam. These nodes are ovoid measuring 2.2 x 1.2 cm on the left and 2.2 x 1.3 cm on the right. The nodes are likely reactive. Vascular: Negative. Limited intracranial: Unremarkable. Visualized orbits: Globes and orbits are within normal limits bilaterally. Mastoids and visualized paranasal sinuses: The paranasal sinuses and mastoid air cells are clear. Skeleton: Vertebral body heights and alignment are maintained. No focal  lytic or blastic lesions are present. Upper chest: The lung apices are clear. IMPRESSION: 1. Progressive inflammatory changes involving the right greater than left palatine tonsil. 2. Interval development of a low-density collection along the lateral aspect of the right palatine tonsil consistent with a developing para tonsillar abscess. 3. Similar appearance of reactive bilateral cervical adenopathy. Electronically Signed   By: San Morelle M.D.   On: 11/01/2017 08:20   Ct Soft Tissue Neck W Contrast  Result Date: 10/30/2017 CLINICAL DATA:  Sore throat, tonsillar swelling beginning last night with difficulty breathing. Assess peritonsillar abscess. History of peritonsillar abscess. EXAM: CT NECK WITH CONTRAST TECHNIQUE: Multidetector CT imaging of the neck was performed using the standard protocol following the bolus administration of intravenous contrast. CONTRAST:  65mL OMNIPAQUE IOHEXOL 300 MG/ML  SOLN COMPARISON:  CT neck Aug 24, 2017. FINDINGS: Large body habitus results in overall noisy image quality. PHARYNX AND LARYNX: Asymmetrically enlarged RIGHT palatine tonsil without focal fluid collection. Preservation of the parapharyngeal fat planes.  Prominent adenoidal soft tissues. Patent airway. Normal epiglottis. Normal larynx. SALIVARY GLANDS: Normal. THYROID: Normal. LYMPH NODES: Similar mild cervical lymphadenopathy measuring to 15 mm short axis LEFT level IIa. VASCULAR: Normal. LIMITED INTRACRANIAL: Normal. VISUALIZED ORBITS: Normal. MASTOIDS AND VISUALIZED PARANASAL SINUSES: Mild paranasal sinus mucosal thickening. Mastoid air cells are well aerated. SKELETON: Nonacute. No CT findings of dental pathology. Calcified stylohyoid ligament. UPPER CHEST: Lung apices are clear. No superior mediastinal lymphadenopathy. OTHER: None. IMPRESSION: 1. Mild asymmetric enlargement RIGHT palatine tonsil without acute tonsillitis or abscess. Patent airway. 2. Mild reactive cervical lymphadenopathy.  Electronically Signed   By: Elon Alas M.D.   On: 10/30/2017 20:25    Procedures Procedures (including critical care time)  Medications Ordered in ED Medications  lidocaine-EPINEPHrine (XYLOCAINE W/EPI) 2 %-1:200000 (PF) injection (has no administration in time range)  lidocaine (PF) (XYLOCAINE) 1 % injection (has no administration in time range)  hydrogen peroxide 3 % external solution (has no administration in time range)  sodium chloride 0.9 % bolus 1,000 mL (0 mLs Intravenous Stopped 11/01/17 0748)  dexamethasone (DECADRON) injection 10 mg (10 mg Intravenous Given 11/01/17 0648)  morphine 4 MG/ML injection 4 mg (4 mg Intravenous Given 11/01/17 0648)  iopamidol (ISOVUE-300) 61 % injection 75 mL (75 mLs Intravenous Contrast Given 11/01/17 0749)     Initial Impression / Assessment and Plan / ED Course  I have reviewed the triage vital signs and the nursing notes.  Pertinent labs & imaging results that were available during my care of the patient were reviewed by me and considered in my medical decision making (see chart for details).     Laboratory results showed no leukocytosis, patient is afebrile.There is no acute airway obstruction, no tripodding present. Patient is given fluids, and morphine to help with comfort. She is comfortable at this time, she states she would like ENT to come to drain this abscess which they have done in the past.  I have placed a call out to ENT, will have them see patient. 8:85 AM Dr. Erik Obey in the ED, he states he will go ahead and drain the abscess at this time.Atfter drainage, she will resume antibiotic therapy and follow up with ENT on a outpatient basis.   Vision post peritonsillar abscess drainage she will continue her clindamycin therapy, she states she feels better and would like to return to work on Tuesday, I will provide a work note.  Dr. Erik Obey would like to see her in the office, advised patient to follow-up in 1 week.  I have discussed  this patient with Dr. Venora Maples, [facility] patient medically stable for discharge. Return precautions have been instructed Final Clinical Impressions(s) / ED Diagnoses   Final diagnoses:  Tonsillar abscess    ED Discharge Orders    None       Janeece Fitting, Hershal Coria 11/01/17 [place], MD 11/04/17 [phone]

## 2017-11-02 NOTE — Consult Note (Signed)
[place], [place] 22 y.o., female [id number]     Chief Complaint: Sore throat  HPI: 22 year old black female comes in with a 3-day history of progressive right sore throat.  She was seen 2 days ago with a negative CT scan.  Today, the CT scan shows a right peritonsillar abscess.  She has had 2 prior peritonsillar abscesses, one in May of this year, and one in November of last year.  No known immune compromise.  Apparently no one has mentioned removing her tonsils thus far.  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  JJH:ERDEYCX reviewed. No pertinent past medical history.  Surg Hx: Past Surgical History:  Procedure Laterality Date  . CESAREAN SECTION  09/26/2013    FHx:   Family History  Problem Relation Age of Onset  . Hypertension Mother   . Diabetes Maternal Grandmother   . Diabetes Maternal Grandfather    SocHx:  reports that she has never smoked. She has never used smokeless tobacco. She reports that she does not drink alcohol or use drugs.  ALLERGIES: No Known Allergies   (Not in a hospital admission)  Results for orders placed or performed during the hospital encounter of 11/01/17 (from the past 48 hour(s))  Comprehensive metabolic panel     Status: Abnormal   Collection Time: 11/01/17  6:44 AM  Result Value Ref Range   Sodium 140 135 - 145 mmol/L   Potassium 3.6 3.5 - 5.1 mmol/L   Chloride 110 98 - 111 mmol/L   CO2 26 22 - 32 mmol/L   Glucose, Bld 126 (H) 70 - 99 mg/dL   BUN 13 6 - 20 mg/dL   Creatinine, Ser 0.74 0.44 - 1.00 mg/dL   Calcium 9.0 8.9 - 10.3 mg/dL   Total Protein 7.3 6.5 - 8.1 g/dL   Albumin 3.3 (L) 3.5 - 5.0 g/dL   AST 15 15 - 41 U/L   ALT 25 0 - 44 U/L   Alkaline Phosphatase 70 38 - 126 U/L   Total Bilirubin 0.2 (L) 0.3 - 1.2 mg/dL   GFR calc non Af Amer >60 >60 mL/min   GFR calc Af Amer >60 >60 mL/min    Comment: (NOTE) The eGFR has been calculated using the CKD EPI equation. This calculation  has not been validated in all clinical situations. eGFR's persistently <60 mL/min signify possible Chronic Kidney Disease.    Anion gap 4 (L) 5 - 15    Comment: Performed at [facility], [place] [address], [place], [place] [address]  CBC with Differential     Status: None   Collection Time: 11/01/17  6:44 AM  Result Value Ref Range   WBC 9.9 4.0 - 10.5 K/uL   RBC 4.44 3.87 - 5.11 MIL/uL   Hemoglobin 12.6 12.0 - 15.0 g/dL   HCT 38.9 36.0 - 46.0 %   MCV 87.6 78.0 - 100.0 fL   MCH 28.4 26.0 - 34.0 pg   MCHC 32.4 30.0 - 36.0 g/dL   RDW 14.0 11.5 - 15.5 %   Platelets 311 150 - 400 K/uL   Neutrophils Relative % 73 %   Neutro Abs 7.3 1.7 - 7.7 K/uL   Lymphocytes Relative 21 %   Lymphs Abs 2.1 0.7 - 4.0 K/uL   Monocytes Relative 6 %   Monocytes Absolute 0.6 0.1 - 1.0 K/uL   Eosinophils Relative 0 %   Eosinophils Absolute 0.0 0.0 - 0.7 K/uL  Basophils Relative 0 %   Basophils Absolute 0.0 0.0 - 0.1 K/uL    Comment: Performed at [facility], [place] [address], [place], [place] [address]   Ct Soft Tissue Neck W Contrast  Result Date: 11/01/2017 CLINICAL DATA:  Sore throat/stridor, epiglottitis or tonsillitis suspected. EXAM: CT NECK WITH CONTRAST TECHNIQUE: Multidetector CT imaging of the neck was performed using the standard protocol following the bolus administration of intravenous contrast. CONTRAST:  26m ISOVUE-300 IOPAMIDOL (ISOVUE-300) INJECTION 61% COMPARISON:  CT neck 10/30/2017. FINDINGS: Pharynx and larynx: No focal mucosal or submucosal lesions are present. Palatine tonsils are enlarged bilaterally. A hypodense region along the lateral aspect of the right palatine tonsil measures 17 x 18 x 15 mm. There is asymmetric stranding in the right parapharyngeal fat. Adenoid tissue is enlarged. Lingual tonsils are prominent without a discrete mass. The hypopharynx is clear. Vocal cords are midline and symmetric. Salivary glands: The submandibular and parotid  glands are within normal limits bilaterally. Thyroid: Normal. Lymph nodes: Enlarged level 2 lymph nodes are similar the prior exam. These nodes are ovoid measuring 2.2 x 1.2 cm on the left and 2.2 x 1.3 cm on the right. The nodes are likely reactive. Vascular: Negative. Limited intracranial: Unremarkable. Visualized orbits: Globes and orbits are within normal limits bilaterally. Mastoids and visualized paranasal sinuses: The paranasal sinuses and mastoid air cells are clear. Skeleton: Vertebral body heights and alignment are maintained. No focal lytic or blastic lesions are present. Upper chest: The lung apices are clear. IMPRESSION: 1. Progressive inflammatory changes involving the right greater than left palatine tonsil. 2. Interval development of a low-density collection along the lateral aspect of the right palatine tonsil consistent with a developing para tonsillar abscess. 3. Similar appearance of reactive bilateral cervical adenopathy. Electronically Signed   By: CSan MorelleM.D.   On: 11/01/2017 08:20     Blood pressure (!) 147/89, pulse 75, temperature 97.7 F (36.5 C), temperature source Oral, resp. rate 15, height 5' 8"  (1.727 m), weight 127.9 kg (282 lb), last menstrual period 10/30/2017, SpO2 100 %.  PHYSICAL EXAM: Overall appearance: She is heavyset and somewhat distressed.  Mental status is appropriate. Head: Atraumatic. Ears: Clear. Nose: Clear. Oral Cavity: Teeth in good repair.  Mild trismus. Oral Pharynx/Hypopharynx/Larynx: There is an old right peritonsillar abscess scar as well as swelling and induration of the right tonsil and soft palate. Neuro: Intact Neck: Small tender right jugulodigastric adenopathy.  Studies Reviewed: Neck CT scan    Assessment/Plan Recurrent right peritonsillar abscess.  Plan: As below, I performed incision and drainage in the emergency room.  She is already on clindamycin.  She declines pain medication.  I will see her in my office in 2  weeks if she is doing well.  We need to plan tonsillectomy so this does not continue to recur.  With informed consent, I anesthetized the right peritonsillar region beginning with Cetacaine spray, followed by 1% Xylocaine with 1-100,000 epinephrine, 8 mL total in stages.  After ascertaining adequate anesthesia, a 2 cm crescent incision was made along the previous scar and carried down along the tonsillar capsule.  Midpole abscess was encountered and widely opened and evacuated.  Hemostasis was spontaneous.  She tolerated this well.   WJodi [id number] 8:48 AM

## 2017-11-11 ENCOUNTER — Ambulatory Visit (HOSPITAL_COMMUNITY)
Admission: EM | Admit: 2017-11-11 | Discharge: 2017-11-11 | Disposition: A | Discharge status: Home / Self Care | Payer: BC Managed Care – PPO | Attending: Family Medicine | Admitting: Family Medicine

## 2017-11-11 ENCOUNTER — Encounter (HOSPITAL_COMMUNITY): Payer: Self-pay | Admitting: Emergency Medicine

## 2017-11-11 DIAGNOSIS — J029 Acute pharyngitis, unspecified: Secondary | ICD-10-CM | POA: Diagnosis not present

## 2017-11-11 LAB — POCT RAPID STREP A: Streptococcus, Group A Screen (Direct): NEGATIVE

## 2017-11-11 NOTE — ED Triage Notes (Signed)
Pt states a week ago she was seen for a peritonsillar abscess and strep, states she was treated for it, took clindamycin. Pt wants to go back to work, states shes been feeling better, but her significant other had strep and shes worried she has strep again.

## 2017-11-11 NOTE — Discharge Instructions (Signed)
We have sent a throat culture and will let you know of any positive results.

## 2017-11-11 NOTE — ED Provider Notes (Signed)
Leslie [name]   [id number] 11/11/17 Arrival Time: 2263  ASSESSMENT & PLAN:  1. Sore throat     Results for orders placed or performed during the hospital encounter of 11/11/17  POCT rapid strep A [facility])  Result Value Ref Range   Streptococcus, Group A Screen (Direct) NEGATIVE NEGATIVE   Labs Reviewed  CULTURE, GROUP A STREP [facility])  POCT RAPID STREP A   OTC analgesics and throat care as needed   Discharge Instructions     We have sent a throat culture and will let you know of any positive results.   May f/u as needed.  Reviewed expectations re: course of current medical issues. Questions answered. Outlined signs and symptoms indicating need for more acute intervention. Patient verbalized understanding. After Visit Summary given.   SUBJECTIVE:  Leslie [name] is a 22 y.o. female who was recently treated for a peritonsillar abscess and strep throat. Resolved after taking clindamycin. Feeling better but worried that she could have acquired strep again since her significant other has strep. No throat discomfort. No respiratory symptoms. Normal PO intake. Fever reported: no. No associated n/v/abdominal symptoms.  ROS: As per HPI.   OBJECTIVE:  Vitals:   11/11/17 1115  BP: 138/77  Pulse: 76  Resp: 18  Temp: 98.2 F (36.8 C)  SpO2: 100%     General appearance: alert; no distress HEENT: throat with no erythema and normal tonsils; uvula midline yes Neck: supple with FROM; no lymphadenopathy Lungs: clear to auscultation bilaterally Skin: reveals no rash; warm and dry Psychological: alert and cooperative; normal mood and affect  No Known Allergies   Social History   Socioeconomic History  . Marital status: Single    Spouse name: Not on file  . Number of children: Not on file  . Years of education: Not on file  . Highest education level: Not on file  Occupational History  . Not on file  Social Needs  . Financial resource strain: Not on  file  . Food insecurity:    Worry: Not on file    Inability: Not on file  . Transportation needs:    Medical: Not on file    Non-medical: Not on file  Tobacco Use  . Smoking status: Never Smoker  . Smokeless tobacco: Never Used  Substance and Sexual Activity  . Alcohol use: No  . Drug use: No  . Sexual activity: Yes    Partners: Male    Birth control/protection: IUD  Lifestyle  . Physical activity:    Days per week: Not on file    Minutes per session: Not on file  . Stress: Not on file  Relationships  . Social connections:    Talks on phone: Not on file    Gets together: Not on file    Attends religious service: Not on file    Active member of club or organization: Not on file    Attends meetings of clubs or organizations: Not on file    Relationship status: Not on file  . Intimate partner violence:    Fear of current or ex partner: Not on file    Emotionally abused: Not on file    Physically abused: Not on file    Forced sexual activity: Not on file  Other Topics Concern  . Not on file  Social History Narrative  . Not on file   Family History  Problem Relation Age of Onset  . Hypertension Mother   . Diabetes Maternal Grandmother   .  Diabetes Maternal Reymundo Poll, MD 11/14/17 [phone]

## 2017-11-13 ENCOUNTER — Telehealth (HOSPITAL_COMMUNITY): Payer: Self-pay

## 2017-11-13 LAB — CULTURE, GROUP A STREP (THRC)

## 2017-11-13 MED ORDER — PENICILLIN V POTASSIUM 500 MG PO TABS: 500.0000 mg | ORAL_TABLET | Freq: Two times a day (BID) | ORAL | 0 refills | Status: AC

## 2017-11-13 NOTE — Telephone Encounter (Signed)
Culture is positive for group A Strep germ.  Prescription for penicillin V 500mg  bid x 10d #20 no refills sent to the pharmacy of record. Attempted to reach patient. No answer at this time.

## 2017-11-18 ENCOUNTER — Telehealth (HOSPITAL_COMMUNITY): Payer: Self-pay

## 2017-11-18 NOTE — Telephone Encounter (Signed)
Attempted to reach patient x 3. Letter sent.

## 2017-12-02 ENCOUNTER — Ambulatory Visit: Payer: BC Managed Care – PPO | Admitting: Obstetrics and Gynecology

## 2018-01-07 ENCOUNTER — Emergency Department (HOSPITAL_COMMUNITY): Payer: BC Managed Care – PPO

## 2018-01-07 ENCOUNTER — Other Ambulatory Visit: Payer: Self-pay

## 2018-01-07 ENCOUNTER — Emergency Department (HOSPITAL_COMMUNITY)
Admission: EM | Admit: 2018-01-07 | Discharge: 2018-01-07 | Disposition: A | Discharge status: Home / Self Care | Payer: BC Managed Care – PPO | Attending: Emergency Medicine | Admitting: Emergency Medicine

## 2018-01-07 ENCOUNTER — Ambulatory Visit (HOSPITAL_COMMUNITY)
Admission: EM | Admit: 2018-01-07 | Discharge: 2018-01-07 | Disposition: A | Discharge status: Home / Self Care | Payer: BC Managed Care – PPO | Source: Home / Self Care

## 2018-01-07 ENCOUNTER — Encounter (HOSPITAL_COMMUNITY): Payer: Self-pay

## 2018-01-07 DIAGNOSIS — S67190A Crushing injury of right index finger, initial encounter: Secondary | ICD-10-CM | POA: Diagnosis present

## 2018-01-07 DIAGNOSIS — S60121A Contusion of right index finger with damage to nail, initial encounter: Secondary | ICD-10-CM | POA: Insufficient documentation

## 2018-01-07 DIAGNOSIS — W230XXA Caught, crushed, jammed, or pinched between moving objects, initial encounter: Secondary | ICD-10-CM | POA: Diagnosis not present

## 2018-01-07 DIAGNOSIS — Y929 Unspecified place or not applicable: Secondary | ICD-10-CM | POA: Insufficient documentation

## 2018-01-07 DIAGNOSIS — Z79899 Other long term (current) drug therapy: Secondary | ICD-10-CM | POA: Diagnosis not present

## 2018-01-07 DIAGNOSIS — S6010XA Contusion of unspecified finger with damage to nail, initial encounter: Secondary | ICD-10-CM

## 2018-01-07 DIAGNOSIS — Y999 Unspecified external cause status: Secondary | ICD-10-CM | POA: Diagnosis not present

## 2018-01-07 DIAGNOSIS — Y939 Activity, unspecified: Secondary | ICD-10-CM | POA: Insufficient documentation

## 2018-01-07 DIAGNOSIS — S6000XA Contusion of unspecified finger without damage to nail, initial encounter: Secondary | ICD-10-CM

## 2018-01-07 HISTORY — DX: Peritonsillar abscess: J36

## 2018-01-07 MED ORDER — IBUPROFEN 200 MG PO TABS
400.0000 mg | ORAL_TABLET | Freq: Once | ORAL | Status: AC
  Administered 2018-01-07: 400 mg via ORAL
  Filled 2018-01-07: qty 2

## 2018-01-07 NOTE — ED Notes (Signed)
Bed: WTR5 Expected date:  Expected time:  Means of arrival:  Comments: 

## 2018-01-07 NOTE — ED Provider Notes (Signed)
[facility] DEPT Provider Note   CSN: [id number] Arrival date & time: 01/07/18  1117     History   Chief Complaint Chief Complaint  Patient presents with  . Finger Injury    HPI Leslie [name] is a 22 y.o. female.  Pt c/o right index finger injury this AM, accidentally slamming in door. Pain moderate, constant, dull, worse w palpation. Skin intact. C/o pain to distal phalanx of finger. Right hand dominant. Nail intact. Denies other pain or injury. No numbness.   The history is provided by the patient.    Past Medical History:  Diagnosis Date  . Peritonsillar abscess     Patient Active Problem List   Diagnosis Date Noted  . IUD strings lost 07/24/2017  . Pure hypercholesterolemia 06/12/2016    Past Surgical History:  Procedure Laterality Date  . CESAREAN SECTION  09/26/2013  . INCISION AND DRAINAGE OF PERITONSILLAR ABCESS       OB History    Gravida  1   Para  1   Term  1   Preterm      AB      Living  1     SAB      TAB      Ectopic      Multiple      Live Births  1            Home Medications    Prior to Admission medications   Medication Sig Start Date End Date Taking? Authorizing Provider  naproxen (NAPROSYN) 500 MG tablet Take 500 mg by mouth 2 (two) times daily as needed (sore throat).    [provider]  Norethindrone Acetate-Ethinyl Estradiol (JUNEL,LOESTRIN,MICROGESTIN) 1.5-30 MG-MCG tablet Take 1 tablet by mouth daily. 09/06/17   Lavonia Drafts, MD    Family History Family History  Problem Relation Age of Onset  . Hypertension Mother   . Diabetes Maternal Grandmother   . Diabetes Maternal Grandfather     Social History Social History   Tobacco Use  . Smoking status: Never Smoker  . Smokeless tobacco: Never Used  Substance Use Topics  . Alcohol use: No  . Drug use: No     Allergies   Patient has no known allergies.   Review of Systems Review of Systems    Constitutional: Negative for fever.  Skin: Negative for wound.  Neurological: Negative for numbness.     Physical Exam Updated Vital Signs BP (!) 154/90 (BP Location: Left Arm)   Pulse 72   Temp 98.1 F (36.7 C)   Ht 1.727 m (5\' 8" )   Wt 127.5 kg   LMP 01/07/2018   SpO2 96%   BMI 42.73 kg/m   Physical Exam  Constitutional: She appears well-developed and well-nourished.  HENT:  Head: Atraumatic.  Eyes: Conjunctivae are normal. No scleral icterus.  Neck: Neck supple. No tracheal deviation present.  Cardiovascular: Intact distal pulses.  Pulmonary/Chest: Effort normal. No respiratory distress.  Abdominal: Normal appearance.  Musculoskeletal:  Contusion to distal phalanx of right index finger. Skin is intact. Nail is intact. There is a tiny subungual hematoma. Normal cap refill distally. Flexor/extensor tendon fxn intact.   Neurological: She is alert.  Sensation/movement grossly intact right index finger.   Skin: Skin is warm and dry. No rash noted.  Psychiatric: She has a normal mood and affect.  Nursing note and vitals reviewed.    ED Treatments / Results  Labs (all labs ordered are listed, but only abnormal results  are displayed) Labs Reviewed - No data to display  EKG None  Radiology Dg Finger Index Right  Result Date: 01/07/2018 CLINICAL DATA:  Crush injury right index finger in a car door this morning. Initial encounter. EXAM: RIGHT INDEX FINGER 2+V COMPARISON:  None. FINDINGS: There is no evidence of fracture or dislocation. There is no evidence of arthropathy or other focal bone abnormality. Soft tissues are unremarkable. IMPRESSION: Negative exam. Electronically Signed   By: Inge Rise M.D.   On: 01/07/2018 12:03    Procedures Procedures (including critical care time)  Medications Ordered in ED Medications - No data to display   Initial Impression / Assessment and Plan / ED Course  I have reviewed the triage vital signs and the nursing  notes.  Pertinent labs & imaging results that were available during my care of the patient were reviewed by me and considered in my medical decision making (see chart for details).  Xrays.   Motrin.  Coldpack.   Splint applied to finger for comfort/support.   xrays reviewed - no fx. Discussed w pt.   Reviewed nursing notes and prior charts for additional history.     Final Clinical Impressions(s) / ED Diagnoses   Final diagnoses:  None    ED Discharge Orders    None       Lajean Saver, MD 01/07/18 1208

## 2018-01-07 NOTE — ED Triage Notes (Signed)
Patient c/o right pointer finger pain. Patient reports that she slammed her finger in a car door.

## 2018-01-07 NOTE — Discharge Instructions (Addendum)
It was our pleasure to provide your ER care today - we hope that you feel better.  Elevate finger/coldpack to sore area.   Take ibuprofen and/or acetaminophen as need for pain.   You may wear finger splint for comfort/support for the next couple days.   Your blood pressure is mildly high today - follow up with primary care doctor in the next few weeks for recheck.

## 2018-01-28 ENCOUNTER — Encounter (HOSPITAL_COMMUNITY): Payer: Self-pay | Admitting: Emergency Medicine

## 2018-01-28 ENCOUNTER — Ambulatory Visit (HOSPITAL_COMMUNITY)
Admission: EM | Admit: 2018-01-28 | Discharge: 2018-01-28 | Disposition: A | Discharge status: Home / Self Care | Payer: BC Managed Care – PPO | Attending: Family Medicine | Admitting: Family Medicine

## 2018-01-28 DIAGNOSIS — R51 Headache: Secondary | ICD-10-CM

## 2018-01-28 DIAGNOSIS — J00 Acute nasopharyngitis [common cold]: Secondary | ICD-10-CM

## 2018-01-28 DIAGNOSIS — J3489 Other specified disorders of nose and nasal sinuses: Secondary | ICD-10-CM

## 2018-01-28 MED ORDER — CETIRIZINE-PSEUDOEPHEDRINE ER 5-120 MG PO TB12: 1.0000 | ORAL_TABLET | Freq: Every day | ORAL | 0 refills | Status: DC

## 2018-01-28 MED ORDER — FLUTICASONE PROPIONATE 50 MCG/ACT NA SUSP: 2.0000 | Freq: Every day | NASAL | 0 refills | Status: DC

## 2018-01-28 MED ORDER — IPRATROPIUM BROMIDE 0.06 % NA SOLN: 2.0000 | Freq: Four times a day (QID) | NASAL | 0 refills | Status: DC

## 2018-01-28 NOTE — Discharge Instructions (Addendum)
Start flonase, atrovent nasal spray, zyrtec-D for nasal congestion/drainage. You can use over the counter nasal saline rinse such as neti pot for nasal congestion. Keep hydrated, your urine should be clear to pale yellow in color. Tylenol/motrin for fever and pain. Monitor for any worsening of symptoms, chest pain, shortness of breath, wheezing, swelling of the throat, follow up for reevaluation.   For sore throat/cough try using a honey-based tea. Use 3 teaspoons of honey with juice squeezed from half lemon. Place shaved pieces of ginger into 1/2-1 cup of water and warm over stove top. Then mix the ingredients and repeat every 4 hours as needed.

## 2018-01-28 NOTE — ED Notes (Signed)
Bed: UC01 Expected date:  Expected time:  Means of arrival:  Comments: Appointments 

## 2018-01-28 NOTE — ED Triage Notes (Signed)
PT reports congestion, facial pain, headache that started over the weekend. PT thinks she may have a sinus infection. PT had blood in her phlegm yesterday.

## 2018-01-28 NOTE — ED Provider Notes (Signed)
[facility]    CSN: [id number] Arrival date & time: 01/28/18  1158     History   Chief Complaint Chief Complaint  Patient presents with  . URI  . Headache    HPI Leslie [name] is a 22 y.o. female.   22 year old female comes in for 3 to 4-day history of URI symptoms.  Has had nasal congestion, rhinorrhea, facial pain, headache.  No obvious cough.  States T-max 99.8 but has felt chills.  She blew her nose, and noticed strings of blood.  States had throat irritation that has since resolved.  Has been drinking fluids/home remedy with good relief.  Positive sick contact. Never smoker.       Past Medical History:  Diagnosis Date  . Peritonsillar abscess     Patient Active Problem List   Diagnosis Date Noted  . IUD strings lost 07/24/2017  . Pure hypercholesterolemia 06/12/2016    Past Surgical History:  Procedure Laterality Date  . CESAREAN SECTION  09/26/2013  . INCISION AND DRAINAGE OF PERITONSILLAR ABCESS      OB History    Gravida  1   Para  1   Term  1   Preterm      AB      Living  1     SAB      TAB      Ectopic      Multiple      Live Births  1            Home Medications    Prior to Admission medications   Medication Sig Start Date End Date Taking? Authorizing Provider  Norethindrone Acetate-Ethinyl Estradiol (JUNEL,LOESTRIN,MICROGESTIN) 1.5-30 MG-MCG tablet Take 1 tablet by mouth daily. 09/06/17  Yes Harraway-Smith, Hoyle Sauer, MD  cetirizine-pseudoephedrine (ZYRTEC-D) 5-120 MG tablet Take 1 tablet by mouth daily. 01/28/18   Tasia Catchings, Mardene Lessig V, PA-C  fluticasone (FLONASE) 50 MCG/ACT nasal spray Place 2 sprays into both nostrils daily. 01/28/18   Tasia Catchings, Rhylee Pucillo V, PA-C  ipratropium (ATROVENT) 0.06 % nasal spray Place 2 sprays into both nostrils 4 (four) times daily. 01/28/18   Tasia Catchings, Litisha Guagliardo V, PA-C  naproxen (NAPROSYN) 500 MG tablet Take 500 mg by mouth 2 (two) times daily as needed (sore throat).    [provider]    Family  History Family History  Problem Relation Age of Onset  . Hypertension Mother   . Diabetes Maternal Grandmother   . Diabetes Maternal Grandfather     Social History Social History   Tobacco Use  . Smoking status: Never Smoker  . Smokeless tobacco: Never Used  Substance Use Topics  . Alcohol use: No  . Drug use: No     Allergies   Patient has no known allergies.   Review of Systems Review of Systems  Reason unable to perform ROS: See HPI as above.     Physical Exam Triage Vital Signs ED Triage Vitals  Enc Vitals Group     BP 01/28/18 1217 119/82     Pulse Rate 01/28/18 1217 68     Resp 01/28/18 1217 18     Temp 01/28/18 1217 97.8 F (36.6 C)     Temp Source 01/28/18 1217 Oral     SpO2 01/28/18 1217 100 %     Weight --      Height --      Head Circumference --      Peak Flow --      Pain Score 01/28/18 1220 6  Pain Loc --      Pain Edu? --      Excl. in [place]? --    No data found.  Updated Vital Signs BP 119/82 (BP Location: Right Arm)   Pulse 68   Temp 97.8 F (36.6 C) (Oral)   Resp 18   LMP 01/27/2018   SpO2 100%   Physical Exam  Constitutional: She is oriented to person, place, and time. She appears well-developed and well-nourished.  Non-toxic appearance. She does not appear ill. No distress.  HENT:  Head: Normocephalic and atraumatic.  Right Ear: Tympanic membrane, external ear and ear canal normal. Tympanic membrane is not erythematous and not bulging.  Left Ear: Tympanic membrane, external ear and ear canal normal. Tympanic membrane is not erythematous and not bulging.  Nose: Mucosal edema present. Right sinus exhibits maxillary sinus tenderness and frontal sinus tenderness. Left sinus exhibits maxillary sinus tenderness and frontal sinus tenderness.  Mouth/Throat: Uvula is midline, oropharynx is clear and moist and mucous membranes are normal.  Eyes: Pupils are equal, round, and reactive to light. Conjunctivae are normal.  Neck: Normal range  of motion. Neck supple.  Cardiovascular: Normal rate, regular rhythm and normal heart sounds. Exam reveals no gallop and no friction rub.  No murmur heard. Pulmonary/Chest: Effort normal and breath sounds normal. She has no decreased breath sounds. She has no wheezes. She has no rhonchi. She has no rales.  Lymphadenopathy:    She has no cervical adenopathy.  Neurological: She is alert and oriented to person, place, and time.  Skin: Skin is warm and dry.  Psychiatric: She has a normal mood and affect. Her behavior is normal. Judgment normal.     UC Treatments / Results  Labs (all labs ordered are listed, but only abnormal results are displayed) Labs Reviewed - No data to display  EKG None  Radiology No results found.  Procedures Procedures (including critical care time)  Medications Ordered in UC Medications - No data to display  Initial Impression / Assessment and Plan / UC Course  I have reviewed the triage vital signs and the nursing notes.  Pertinent labs & imaging results that were available during my care of the patient were reviewed by me and considered in my medical decision making (see chart for details).    Discussed with patient history and exam most consistent with viral URI. Symptomatic treatment as needed. Push fluids. Return precautions given.   Final Clinical Impressions(s) / UC Diagnoses   Final diagnoses:  Acute nasopharyngitis  Sinus pressure    ED Prescriptions    Medication Sig Dispense Auth. Provider   fluticasone (FLONASE) 50 MCG/ACT nasal spray Place 2 sprays into both nostrils daily. 1 g Arsal Tappan V, PA-C   ipratropium (ATROVENT) 0.06 % nasal spray Place 2 sprays into both nostrils 4 (four) times daily. 15 mL Kamariyah Timberlake V, PA-C   cetirizine-pseudoephedrine (ZYRTEC-D) 5-120 MG tablet Take 1 tablet by mouth daily. 15 tablet Tobin Chad, [place] 01/28/18 1242

## 2018-01-31 ENCOUNTER — Encounter (HOSPITAL_COMMUNITY): Payer: Self-pay | Admitting: Emergency Medicine

## 2018-01-31 ENCOUNTER — Emergency Department (HOSPITAL_COMMUNITY)
Admission: EM | Admit: 2018-01-31 | Discharge: 2018-01-31 | Disposition: A | Discharge status: Home / Self Care | Payer: BC Managed Care – PPO | Attending: Emergency Medicine | Admitting: Emergency Medicine

## 2018-01-31 ENCOUNTER — Encounter (HOSPITAL_COMMUNITY): Payer: Self-pay

## 2018-01-31 ENCOUNTER — Other Ambulatory Visit: Payer: Self-pay

## 2018-01-31 ENCOUNTER — Emergency Department (HOSPITAL_COMMUNITY)
Admission: EM | Admit: 2018-01-31 | Discharge: 2018-02-01 | Disposition: A | Discharge status: Home / Self Care | Payer: BC Managed Care – PPO | Source: Home / Self Care | Attending: Emergency Medicine | Admitting: Emergency Medicine

## 2018-01-31 DIAGNOSIS — Z79899 Other long term (current) drug therapy: Secondary | ICD-10-CM

## 2018-01-31 DIAGNOSIS — J029 Acute pharyngitis, unspecified: Secondary | ICD-10-CM | POA: Diagnosis not present

## 2018-01-31 DIAGNOSIS — M542 Cervicalgia: Secondary | ICD-10-CM | POA: Insufficient documentation

## 2018-01-31 DIAGNOSIS — J36 Peritonsillar abscess: Secondary | ICD-10-CM | POA: Insufficient documentation

## 2018-01-31 DIAGNOSIS — R110 Nausea: Secondary | ICD-10-CM

## 2018-01-31 DIAGNOSIS — R51 Headache: Secondary | ICD-10-CM

## 2018-01-31 LAB — GROUP A STREP BY PCR: Group A Strep by PCR: NOT DETECTED

## 2018-01-31 MED ORDER — MORPHINE SULFATE (PF) 4 MG/ML IV SOLN: 4.0000 mg | Freq: Once | INTRAVENOUS | Status: DC

## 2018-01-31 MED ORDER — DEXAMETHASONE SODIUM PHOSPHATE 10 MG/ML IJ SOLN: 10.0000 mg | Freq: Once | INTRAMUSCULAR | Status: DC

## 2018-01-31 MED ORDER — PENICILLIN G BENZATHINE 1200000 UNIT/2ML IM SUSP
1.2000 10*6.[IU] | Freq: Once | INTRAMUSCULAR | Status: AC
  Administered 2018-01-31: 1.2 10*6.[IU] via INTRAMUSCULAR
  Filled 2018-01-31: qty 2

## 2018-01-31 MED ORDER — SODIUM CHLORIDE 0.9 % IV BOLUS: 1000.0000 mL | Freq: Once | INTRAVENOUS | Status: DC

## 2018-01-31 NOTE — ED Provider Notes (Signed)
Medical screening examination/treatment/procedure(s) were conducted as a shared visit with non-physician practitioner(s) and myself.  I personally evaluated the patient during the encounter.  None 22 year old female presents with right posterior throat pain.  History of peritonsillar abscess.  On exam she has slight swelling at the right tonsillar pillar without exudate but slight erythema.  Her voice is normal.  No obvious peritonsillar abscess.  Suspect early cellulitis.  Will give dose of Bicillin here and return precautions given   Lacretia Leigh, MD 01/31/18 [id number]

## 2018-01-31 NOTE — Discharge Instructions (Addendum)
You were evaluated today in the ED for right neck pain.  We have given you IM Penicillin for possible strep. Please follow up with ENT for re-evaluation.   Return to the ED with any new or worsening symptoms such as:  Get help right away if: You throw up (vomit). You get a very bad headache. You neck hurts or it feels stiff. You have chest pain or you are short of breath. You have drooling, very bad throat pain, or changes in your voice. Your neck is swollen or the skin gets red and tender. Your mouth is dry or you are peeing less than normal. You keep feeling more tired or it is hard to wake up. Your joints are red or they hurt.

## 2018-01-31 NOTE — ED Provider Notes (Signed)
[facility] DEPT Provider Note   CSN: [id number] Arrival date & time: 01/31/18  2053     History   Chief Complaint Chief Complaint  Patient presents with  . Sore Throat    HPI Deena Shaub is a 22 y.o. 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 Associated symptoms include difficulty swallowing onset this morning.  Pt reports she was seen this morning, given PCN IM and discharged home.  Record review from this morning shows pt without discrete abscess, handling secretions and with patent airway.  Swallowing makes her symptoms worse.  Nothing makes them better.  Pt reports previously taking Clindamycin without improvement.  Pt denies fever, chills, chest pain, SOB, abd pain, vomiting, diarrhea, weakness, dizziness, syncope.  Pt reports associated nausea, generalized headache and anterior neck pain.   Pt reports she discussed with the ENT (Dr. Constance Holster) on call who recommended eval in the ED.     The history is provided by the patient and medical records. No language interpreter was used.    Past Medical History:  Diagnosis Date  . Peritonsillar abscess     Patient Active Problem List   Diagnosis Date Noted  . IUD strings lost 07/24/2017  . Pure hypercholesterolemia 06/12/2016    Past Surgical History:  Procedure Laterality Date  . CESAREAN SECTION  09/26/2013  . INCISION AND DRAINAGE OF PERITONSILLAR ABCESS       OB History    Gravida  1   Para  1   Term  1   Preterm      AB      Living  1     SAB      TAB      Ectopic      Multiple      Live Births  1            Home Medications    Prior to Admission medications   Medication Sig Start Date End Date Taking? Authorizing Provider  Norethindrone  Acetate-Ethinyl Estradiol (JUNEL,LOESTRIN,MICROGESTIN) 1.5-30 MG-MCG tablet Take 1 tablet by mouth daily. 09/06/17  Yes Harraway-Smith, Hoyle Sauer, MD  cetirizine-pseudoephedrine (ZYRTEC-D) 5-120 MG tablet Take 1 tablet by mouth daily. 01/28/18   Tasia Catchings, Amy V, PA-C  fluticasone (FLONASE) 50 MCG/ACT nasal spray Place 2 sprays into both nostrils daily. 01/28/18   Tasia Catchings, Amy V, PA-C  ipratropium (ATROVENT) 0.06 % nasal spray Place 2 sprays into both nostrils 4 (four) times daily. 01/28/18   Ok Edwards, PA-C    Family History Family History  Problem Relation Age of Onset  . Hypertension Mother   . Diabetes Maternal Grandmother   . Diabetes Maternal Grandfather     Social History Social History   Tobacco Use  . Smoking status: Never Smoker  . Smokeless tobacco: Never Used  Substance Use Topics  . Alcohol use: No  . Drug use: No     Allergies   Patient has no known allergies.   Review of Systems Review of Systems  Constitutional: Positive for fatigue. Negative for chills and fever.  HENT: Positive for sore throat and trouble swallowing. Negative for congestion, dental problem, drooling, ear pain, facial swelling, mouth sores, postnasal drip, rhinorrhea and voice change.   Eyes: Negative for pain.  Respiratory: Negative for cough, chest tightness and shortness of  breath.   Cardiovascular: Negative for chest pain.  Gastrointestinal: Negative for abdominal pain, nausea and vomiting.  Musculoskeletal: Positive for neck pain (anterior). Negative for neck stiffness.  Skin: Negative for rash.  Allergic/Immunologic: Negative for immunocompromised state.  Neurological: Negative for facial asymmetry and headaches.  Hematological: Negative for adenopathy.  Psychiatric/Behavioral: The patient is not nervous/anxious.      Physical Exam Updated Vital Signs BP 124/75   Pulse 99   Temp 98.7 F (37.1 C) (Oral)   Resp 16   LMP 01/27/2018   SpO2 100%   Physical Exam  Constitutional: She appears  well-developed and well-nourished. No distress.  HENT:  Head: Normocephalic and atraumatic.  Right Ear: Tympanic membrane, external ear and ear canal normal.  Left Ear: Tympanic membrane, external ear and ear canal normal.  Nose: Nose normal. No mucosal edema or rhinorrhea.  Mouth/Throat: Uvula is midline and mucous membranes are normal. Mucous membranes are not dry. No trismus in the jaw. No uvula swelling. Posterior oropharyngeal edema and posterior oropharyngeal erythema present. No oropharyngeal exudate or tonsillar abscesses.  Significant swelling of the right tonsillar pillar with erythema  Eyes: Conjunctivae are normal.  Neck: Normal range of motion, full passive range of motion without pain and phonation normal. No tracheal tenderness, no spinous process tenderness and no muscular tenderness present. No neck rigidity. No erythema and normal range of motion present. No Brudzinski's sign and no Kernig's sign noted.  Full fange of motion without pain  TTP of the anterior neck No midline or paraspinal tenderness Hoarse Voice No stridor Pt spitting into a bag, can swallow with great effort No nuchal rigidity or meningeal signs  Cardiovascular: Normal rate, regular rhythm and normal heart sounds.  Pulses:      Radial pulses are 2+ on the right side, and 2+ on the left side.  Pulmonary/Chest: Effort normal and breath sounds normal. No stridor. No respiratory distress. She has no decreased breath sounds. She has no wheezes.  Equal chest expansion, clear and equal breath sounds without focal wheezes, rhonchi or rales  Musculoskeletal: Normal range of motion.  Lymphadenopathy:       Head (right side): Submandibular and tonsillar adenopathy present. No submental, no preauricular, no posterior auricular and no occipital adenopathy present.       Head (left side): No submental, no submandibular, no tonsillar, no preauricular, no posterior auricular and no occipital adenopathy present.    She has  no cervical adenopathy.       Right cervical: No superficial cervical, no deep cervical and no posterior cervical adenopathy present.      Left cervical: No superficial cervical, no deep cervical and no posterior cervical adenopathy present.  Neurological: She is alert.  Alert and oriented Moves all extremities without ataxia  Skin: Skin is warm and dry. She is not diaphoretic.  Psychiatric: She has a normal mood and affect.  Nursing note and vitals reviewed.    ED Treatments / Results   Procedures Procedures (including critical care time)  Medications Ordered in ED Medications  clindamycin (CLEOCIN) IVPB 600 mg (has no administration in time range)  morphine 4 MG/ML injection 4 mg (4 mg Intravenous Given 02/01/18 0059)  dexamethasone (DECADRON) injection 10 mg (10 mg Intramuscular Given 02/01/18 0058)  0.9 %  sodium chloride infusion ( Intravenous New Bag/Given 02/01/18 0103)  morphine 4 MG/ML injection 4 mg (4 mg Intravenous Given 02/01/18 0551)     Initial Impression / Assessment and Plan / ED Course  I have  reviewed the triage vital signs and the nursing notes.  Pertinent labs & imaging results that were available during my care of the patient were reviewed by me and considered in my medical decision making (see chart for details).  Clinical Course as of Feb 01 650  Fri Jan 31, 2018  2343 Dr. Constance Holster paged twice   [HM]  Sat Feb 01, 2018  0011 Continue to page Dr. Constance Holster and have left a voicemail on his phone.     [HM]  [phone] Continue to be unable to contact Dr. Constance Holster.  I have discussed the situation with the patient.  I have offered to treatment here with pain medication and Decadron versus treatment and home with return in the morning.  Patient reports she does not feel comfortable going home as she is worried that her airway will swell shut.  We will monitor here in the emergency department overnight until we are able to contact Dr. Constance Holster.   [HM]  [phone] Patient resting more  comfortably.  Her airway remains intact.  No hypoxia.  She continues to spit.  Have not heard from Dr. Constance Holster.   [HM]  4496 Pt airway remains patent.  Her pain is returning.  Will redose morphine.     [HM]  0645 No contact from Dr. Constance Holster despite continued attempts to reach him.     [HM]    Clinical Course User Index [HM] Rian Koon, Jarrett Soho, PA-C    Patient with history of recurrent peritonsillar abscess.  Seen this morning for sore throat and given IM penicillin.  No clinical presentation of peritonsillar abscess at that time.  Exam tonight is with asymmetric swelling and significant erythema of the right tonsillar pillar.  Concern for abscess.  Patient without stridor or respiratory distress.  She is spitting into a bag but is able to swallow if forced to do so.  Will consult with ENT.  6:36 AM Able to reach ENT overnight.  Patient has stayed here in the emergency department for airway monitoring.  She has been given IV clindamycin, morphine, Decadron and fluids. She is stable.    6:49 AM Discussed with Dr. Constance Holster at this time.  He will come to evaluate the patient.  At shift change care was transferred to [facility], PA-C who will dispo patient after she is evaluated by Dr. Constance Holster.    Final Clinical Impressions(s) / ED Diagnoses   Final diagnoses:  Peritonsillar abscess    ED Discharge Orders    None       Loni Muse Gwenlyn Perking 02/01/18 7591    Drenda Freeze, MD 02/01/18 1450

## 2018-01-31 NOTE — ED Provider Notes (Signed)
[facility] DEPT Provider Note   CSN: [id number] Arrival date & time: 01/31/18  6283   History   Chief Complaint Chief Complaint  Patient presents with  . abscess    HPI Leslie [name] is a 22 y.o. female with a past medical history significant for recurrent peri-tonsillar abscess who presents for evaluation of right sided neck pain. Pain started this morning. Rates her pain a 5/10. Denies fever, chills, nausea, vomiting, voices changes, SOB, difficulty swallowing. Denies aggravating or alleviating factors. Has not taken anything for the pain.  History obtained from patient and family. No interpretor was used.  HPI  Past Medical History:  Diagnosis Date  . Peritonsillar abscess     Patient Active Problem List   Diagnosis Date Noted  . IUD strings lost 07/24/2017  . Pure hypercholesterolemia 06/12/2016    Past Surgical History:  Procedure Laterality Date  . CESAREAN SECTION  09/26/2013  . INCISION AND DRAINAGE OF PERITONSILLAR ABCESS       OB History    Gravida  1   Para  1   Term  1   Preterm      AB      Living  1     SAB      TAB      Ectopic      Multiple      Live Births  1            Home Medications    Prior to Admission medications   Medication Sig Start Date End Date Taking? Authorizing Provider  cetirizine-pseudoephedrine (ZYRTEC-D) 5-120 MG tablet Take 1 tablet by mouth daily. 01/28/18  Yes Yu, Amy V, PA-C  fluticasone (FLONASE) 50 MCG/ACT nasal spray Place 2 sprays into both nostrils daily. 01/28/18  Yes Yu, Amy V, PA-C  ipratropium (ATROVENT) 0.06 % nasal spray Place 2 sprays into both nostrils 4 (four) times daily. 01/28/18  Yes Yu, Amy V, PA-C  naproxen (NAPROSYN) 500 MG tablet Take 500 mg by mouth 2 (two) times daily as needed (sore throat).   Yes [provider]  Norethindrone Acetate-Ethinyl Estradiol (JUNEL,LOESTRIN,MICROGESTIN) 1.5-30 MG-MCG tablet Take 1 tablet by mouth daily. 09/06/17   Yes Lavonia Drafts, MD    Family History Family History  Problem Relation Age of Onset  . Hypertension Mother   . Diabetes Maternal Grandmother   . Diabetes Maternal Grandfather     Social History Social History   Tobacco Use  . Smoking status: Never Smoker  . Smokeless tobacco: Never Used  Substance Use Topics  . Alcohol use: No  . Drug use: No     Allergies   Patient has no known allergies.   Review of Systems Review of Systems  Constitutional: Negative.   HENT: Positive for sore throat. Negative for congestion, dental problem, drooling, mouth sores, nosebleeds, postnasal drip, rhinorrhea, sinus pressure, sinus pain, tinnitus, trouble swallowing and voice change.   Respiratory: Negative.   Cardiovascular: Negative.   Gastrointestinal: Negative.   Genitourinary: Negative.   Musculoskeletal: Positive for neck pain.  Skin: Negative.   All other systems reviewed and are negative.    Physical Exam Updated Vital Signs BP 140/75   Pulse 94   Temp 98.1 F (36.7 C) (Oral)   Resp 16   LMP 01/27/2018   SpO2 97%   Physical Exam  Constitutional: She appears well-developed and well-nourished. No distress.  HENT:  Head: Atraumatic.  Right Ear: Tympanic membrane, external ear and ear canal normal.  Left  Ear: Tympanic membrane, external ear and ear canal normal.  Nose: Nose normal. Right sinus exhibits no maxillary sinus tenderness and no frontal sinus tenderness. Left sinus exhibits no maxillary sinus tenderness and no frontal sinus tenderness.  Mouth/Throat: Uvula is midline and mucous membranes are normal. No oral lesions. No trismus in the jaw. No dental abscesses, uvula swelling or dental caries. Posterior oropharyngeal erythema present. No oropharyngeal exudate or posterior oropharyngeal edema. No tonsillar exudate.    No obvious tonsillar abscess. Able to swallow secretions without difficulty.   Eyes: Pupils are equal, round, and reactive to light.    Neck: Normal range of motion, full passive range of motion without pain and phonation normal. Neck supple. No neck rigidity. No edema, no erythema and normal range of motion present.  Cardiovascular: Normal rate and intact distal pulses.  Pulmonary/Chest: Breath sounds normal. No stridor. No respiratory distress. She has no decreased breath sounds. She has no wheezes. She has no rhonchi. She has no rales.  No respiratory distress  Abdominal: She exhibits no distension.  Musculoskeletal: Normal range of motion.  Neurological: She is alert.  Skin: Skin is warm and dry. She is not diaphoretic.  Psychiatric: She has a normal mood and affect.  Nursing note and vitals reviewed.    ED Treatments / Results  Labs (all labs ordered are listed, but only abnormal results are displayed) Labs Reviewed  GROUP A STREP BY PCR  CULTURE, GROUP A STREP [facility])    EKG None  Radiology No results found.  Procedures Procedures (including critical care time)  Medications Ordered in ED Medications  penicillin g benzathine (BICILLIN LA) 1200000 UNIT/2ML injection 1.2 Million Units (1.2 Million Units Intramuscular Given 01/31/18 0853)     Initial Impression / Assessment and Plan / ED Course  I have reviewed the triage vital signs and the nursing notes.  Pertinent labs & imaging results that were available during my care of the patient were reviewed by me and considered in my medical decision making (see chart for details).  22 year old who appears otherwise well presents for evaluation of right sided neck pain. Hx peritonsillar abscess. Afebrile, nonseptic, non-ill appearing. Has been seen previously by [facility] ENT for drainage of PTA. Slight swelling to right tonsillar pillar without exudate. Possible early cellulitis. No obvious PTA. No trismus or drooling. Able to tolerate PO intake without difficulty in ED. No obvious neck pain or stiffness on exam. Patient states Clindamycin has not worked for  her previous tonsillar abscesses. Shared decision making was made and not to precede with the CT neck soft tissue to r/o abscess. Strep screen pending at dc however given history will treat for strep. Will culture strep screen.   Patient was seen and evaluated by my attending Dr. Zenia Resides who agrees with plan, IM bacillin for treatment and close outpatient follow-up with ENT with dc home.   Strict return precautions given. Discussed close follow up with ENT. Patient voiced understanding and is agreeable for follow-up.    Final Clinical Impressions(s) / ED Diagnoses   Final diagnoses:  Sore throat    ED Discharge Orders    None       Zakayla Martinec A, PA-C 01/31/18 9417    Lacretia Leigh, MD 02/02/18 [phone]

## 2018-01-31 NOTE — ED Triage Notes (Addendum)
Pt reports R throat pain. She reports a hx of peritonsillar abscesses that require draining. She has contacted the ENT on call and he recommended that she come in to be evaluated. Pt having difficulty swallowing secretions. A&Ox4. No SOB, but she states that her throat feels swollen. She was seen for same this morning and given antibiotics, but states that they told her to return if her symptoms got worse.

## 2018-01-31 NOTE — ED Notes (Signed)
Bed: WA03 Expected date:  Expected time:  Means of arrival:  Comments: 

## 2018-01-31 NOTE — ED Triage Notes (Signed)
Pt c/o abscess coming up on right side of nec. Reports she has these reoccurring.

## 2018-02-01 MED ORDER — CLINDAMYCIN PHOSPHATE 600 MG/50ML IV SOLN
600.0000 mg | Freq: Once | INTRAVENOUS | Status: DC
  Filled 2018-02-01: qty 50

## 2018-02-01 MED ORDER — CLINDAMYCIN HCL 300 MG PO CAPS: 300.0000 mg | ORAL_CAPSULE | Freq: Three times a day (TID) | ORAL | 0 refills | Status: AC

## 2018-02-01 MED ORDER — DEXAMETHASONE SODIUM PHOSPHATE 10 MG/ML IJ SOLN
10.0000 mg | Freq: Once | INTRAMUSCULAR | Status: AC
  Administered 2018-02-01: 10 mg via INTRAMUSCULAR
  Filled 2018-02-01: qty 1

## 2018-02-01 MED ORDER — MORPHINE SULFATE (PF) 4 MG/ML IV SOLN
4.0000 mg | Freq: Once | INTRAVENOUS | Status: AC
  Administered 2018-02-01: 4 mg via INTRAVENOUS
  Filled 2018-02-01: qty 1

## 2018-02-01 MED ORDER — SODIUM CHLORIDE 0.9 % IV SOLN
Freq: Once | INTRAVENOUS | Status: AC
  Administered 2018-02-01: 01:00:00 via INTRAVENOUS

## 2018-02-01 NOTE — ED Notes (Signed)
Patient ambulated to restroom with minimal assistance. ?

## 2018-02-01 NOTE — ED Provider Notes (Signed)
  Physical Exam  BP 137/84   Pulse 71   Temp 98.7 F (37.1 C) (Oral)   Resp 17   LMP 01/27/2018   SpO2 96%   Physical Exam  Constitutional: She appears well-developed and well-nourished. No distress.  Nontoxic-appearing in no acute distress.  Tolerating secretions without difficulty.  HENT:  Head: Normocephalic and atraumatic.  Eyes: Conjunctivae and EOM are normal. No scleral icterus.  Neck: Normal range of motion.  Pulmonary/Chest: Effort normal. No respiratory distress.  Neurological: She is alert.  Skin: No rash noted. She is not diaphoretic.  Psychiatric: She has a normal mood and affect.  Nursing note and vitals reviewed.   ED Course/Procedures   Clinical Course as of Feb 01 825  Fri Jan 31, 2018  2343 Dr. Constance Holster paged twice   [HM]  Sat Feb 01, 2018  0011 Continue to page Dr. Constance Holster and have left a voicemail on his phone.     [HM]  [phone] Continue to be unable to contact Dr. Constance Holster.  I have discussed the situation with the patient.  I have offered to treatment here with pain medication and Decadron versus treatment and home with return in the morning.  Patient reports she does not feel comfortable going home as she is worried that her airway will swell shut.  We will monitor here in the emergency department overnight until we are able to contact Dr. Constance Holster.   [HM]  [phone] Patient resting more comfortably.  Her airway remains intact.  No hypoxia.  She continues to spit.  Have not heard from Dr. Constance Holster.   [HM]  1683 Pt airway remains patent.  Her pain is returning.  Will redose morphine.     [HM]  0645 No contact from Dr. Constance Holster despite continued attempts to reach him.     [HM]    Clinical Course User Index [HM] Muthersbaugh, Gwenlyn Perking    Procedures  MDM  Care handed off from previous provider, H.Muthersbaugh, PA-C.  Please see their note for further detail.  Briefly, patient with a history of recurrent peritonsillar abscesses with 5 in the last 12 months, last in June  2019 with I&D always by ENT, who presents for sore throat for the past 3 days.  Reports difficulty swallowing this morning.  States that she was seen when symptoms began, given IM penicillin discharge home.  She discussed with Dr. Constance Holster prior to arrival here and he recommended evaluation in the ED.  Previous provider tried to contact Dr. Constance Holster but was unable to get in touch with him for the past 7 hours.  Plan is to consult him or next on-call ENT provider when they are available.  Patient is pain controlled here in the ED.  8:27 AM Dr. Constance Holster evaluated the patient and did I&D at bedside.  Sent a prescription for clindamycin to pharmacy.  Will advise her to return to ED for any severe worsening symptoms.   Portions of this note were generated with [employer]. Dictation errors may occur despite best attempts at proofreading.    Delia Heady, PA-C 02/01/18 Shannon, [place], MD 02/01/18 [phone]

## 2018-02-01 NOTE — Consult Note (Signed)
  Patient is well-known to our practice with recurrent peritonsillar abscess.  Recommendation has been made multiple times for tonsillectomy to avoid continued problems.  She started having symptoms again on Thursday.  She has right side sore throat.  On examination, she is awake alert and breathing easily.  She is having no trouble with secretions.  There is no stridor.  There is minimal trismus.  There is fullness of the right soft palate and mild to moderate displacement of the tonsil towards midline.  Remainder of the pharynx looks clear.  There is right side tender adenopathy.  This is consistent with recurrent peri-tonsillar abscess.  Recommend incision and drainage.  Procedure note: 1% Xylocaine with epinephrine was infiltrated into the right soft palate left response.  A 18-gauge needle was used to aspirate peritonsillar fluid there is approximately 2 cc of blood loss obtained.  A scalpel was used to incise the mucosa and tonsil hemostat was used to open the cavity widely.  There is no additional purulence.  She tolerated this well with minimal bleeding.  We will treat with clindamycin orally.  We will have her follow-up in the office this week.  Strong recommendation made again for tonsillectomy.

## 2018-02-01 NOTE — Discharge Instructions (Signed)
Return to ED for worsening symptoms, trouble breathing or trouble swallowing, chest pain, vomiting coughing up blood.

## 2018-02-02 LAB — CULTURE, GROUP A STREP (THRC)

## 2018-02-06 ENCOUNTER — Encounter (HOSPITAL_COMMUNITY): Payer: Self-pay | Admitting: Emergency Medicine

## 2018-02-06 ENCOUNTER — Emergency Department (HOSPITAL_COMMUNITY)
Admission: EM | Admit: 2018-02-06 | Discharge: 2018-02-06 | Disposition: A | Discharge status: Home / Self Care | Payer: BC Managed Care – PPO | Attending: Emergency Medicine | Admitting: Emergency Medicine

## 2018-02-06 DIAGNOSIS — Z79899 Other long term (current) drug therapy: Secondary | ICD-10-CM | POA: Insufficient documentation

## 2018-02-06 DIAGNOSIS — J029 Acute pharyngitis, unspecified: Secondary | ICD-10-CM | POA: Insufficient documentation

## 2018-02-06 MED ORDER — DEXAMETHASONE SODIUM PHOSPHATE 10 MG/ML IJ SOLN
10.0000 mg | Freq: Once | INTRAMUSCULAR | Status: AC
  Administered 2018-02-06: 10 mg via INTRAMUSCULAR
  Filled 2018-02-06: qty 1

## 2018-02-06 NOTE — ED Triage Notes (Signed)
Patient reports that she was here for tonsillar abscess and had it drained and completed her course of antibiotics that was given. Reports today while at work felt a "pus pocket bust and spitting up pus". Pt has cup that she is spitting yellowish-clear spit into.

## 2018-02-06 NOTE — Discharge Instructions (Addendum)
Please return to the Emergency Department for any new or worsening symptoms or if your symptoms do not improve. Please be sure to follow up with your Primary Care Physician as soon as possible regarding your visit today. If you do not have a Primary Doctor please use the resources below to establish one. You have been given an anti-inflammatory shot today called Decadron.  This medication helps with inflammation. Please call Dr. Janeice Robinson office and schedule a follow-up appointment for tomorrow. At this time there are no signs of peritonsillar abscess.  However due to your history of recurrent peritonsillar abscesses it is very important for you to return to the Emergency Department as soon as possible if your throat pain returns.  Contact a doctor if: You have more pain, swelling, redness, or drainage in your throat. You have a headache, have low energy, or feel sick. You have a fever. You feel dizzy. You have trouble swallowing or eating. You have signs of body fluid loss (dehydration): Light-headedness when you are standing. Peeing (urinating) less. A fast heart rate. Dry mouth. Get help right away if: You have trouble talking or breathing. You find it easier to breathe when you lean forward. You are coughing up blood or throwing up (vomiting) blood. You have severe throat pain that is not helped by medicines. You start to drool.  Do not take your medicine if  develop an itchy rash, swelling in your mouth or lips, or difficulty breathing.

## 2018-02-06 NOTE — ED Provider Notes (Signed)
[facility] DEPT Provider Note   CSN: [id number] Arrival date & time: 02/06/18  1559     History   Chief Complaint Chief Complaint  Patient presents with  . Abscess    HPI Leslie [name] is a 22 y.o. female with history of peritonsillar abscess presenting for resolving right sided throat tightness.  Patient was seen in the emergency department on 01/31/2018 for right-sided peritonsillar abscess.  Patient had incision and drainage performed by ENT Dr. Constance Holster on 02/01/2018.  Patient was placed on 300 mg clindamycin 3 times daily x5 days, last dose yesterday.  Patient states that her symptoms have completely resolved following incision and drainage and antibiotic administration until this morning.  She states she felt a mild tightness on the right side of her throat similar to previous peritonsillar abscesses.  Patient states that this continued until 3 PM this afternoon when she felt a "pop" in her throat and spit out a small amount of purulent drainage.  Patient states that since that time her throat tightness has been resolving.  Patient states that she has minimal tightness at this time.  At time of evaluation patient resting comfortably no acute distress, no drooling or change in voice.  Patient swallowing without difficulty, airway intact.  Patient states that she is feeling much improved at this time.  Denies fever, headache, nausea/vomiting, trouble breathing, trouble swallowing, drooling, neck swelling or facial swelling.  HPI  Past Medical History:  Diagnosis Date  . Peritonsillar abscess     Patient Active Problem List   Diagnosis Date Noted  . IUD strings lost 07/24/2017  . Pure hypercholesterolemia 06/12/2016    Past Surgical History:  Procedure Laterality Date  . CESAREAN SECTION  09/26/2013  . INCISION AND DRAINAGE OF PERITONSILLAR ABCESS       OB History    Gravida  1   Para  1   Term  1   Preterm      AB      Living  1       SAB      TAB      Ectopic      Multiple      Live Births  1            Home Medications    Prior to Admission medications   Medication Sig Start Date End Date Taking? Authorizing Provider  cetirizine-pseudoephedrine (ZYRTEC-D) 5-120 MG tablet Take 1 tablet by mouth daily. 01/28/18   Tasia Catchings, Amy V, PA-C  clindamycin (CLEOCIN) 300 MG capsule Take 1 capsule (300 mg total) by mouth 3 (three) times daily for 10 days. 02/01/18 02/11/18  Izora Gala, MD  fluticasone (FLONASE) 50 MCG/ACT nasal spray Place 2 sprays into both nostrils daily. 01/28/18   Tasia Catchings, Amy V, PA-C  ipratropium (ATROVENT) 0.06 % nasal spray Place 2 sprays into both nostrils 4 (four) times daily. 01/28/18   Tasia Catchings, Amy V, PA-C  Norethindrone Acetate-Ethinyl Estradiol (JUNEL,LOESTRIN,MICROGESTIN) 1.5-30 MG-MCG tablet Take 1 tablet by mouth daily. 09/06/17   Lavonia Drafts, MD    Family History Family History  Problem Relation Age of Onset  . Hypertension Mother   . Diabetes Maternal Grandmother   . Diabetes Maternal Grandfather     Social History Social History   Tobacco Use  . Smoking status: Never Smoker  . Smokeless tobacco: Never Used  Substance Use Topics  . Alcohol use: No  . Drug use: No     Allergies   Patient has no known  allergies.   Review of Systems Review of Systems  Constitutional: Negative.  Negative for chills and fever.  HENT: Positive for sore throat. Negative for drooling, ear pain, trouble swallowing and voice change.   Respiratory: Negative.  Negative for cough, choking and shortness of breath.   Gastrointestinal: Negative.  Negative for abdominal pain, nausea and vomiting.  Musculoskeletal: Negative.  Negative for neck pain.  Skin: Negative.  Negative for rash.   Physical Exam Updated Vital Signs BP 136/77 (BP Location: Left Arm)   Pulse 86   Temp 98.3 F (36.8 C) (Oral)   Resp 17   LMP 01/27/2018   SpO2 100%   Physical Exam  Constitutional: She appears  well-developed and well-nourished. No distress.  HENT:  Head: Normocephalic and atraumatic.  Right Ear: Hearing, tympanic membrane, external ear and ear canal normal.  Left Ear: Hearing, tympanic membrane, external ear and ear canal normal.  Nose: Nose normal. Right sinus exhibits no maxillary sinus tenderness. Left sinus exhibits no maxillary sinus tenderness.  Mouth/Throat: Uvula is midline, oropharynx is clear and moist and mucous membranes are normal. No trismus in the jaw. No uvula swelling. No oropharyngeal exudate, posterior oropharyngeal edema, posterior oropharyngeal erythema or tonsillar abscesses. Tonsils are 1+ on the right. Tonsils are 1+ on the left. No tonsillar exudate.  No tonsillar erythema No drainage present Able to swallow liquids without discomfort/pain  Eyes: Pupils are equal, round, and reactive to light. Conjunctivae and EOM are normal.  Neck: Trachea normal, normal range of motion, full passive range of motion without pain and phonation normal. Neck supple. No tracheal tenderness present. No neck rigidity. No tracheal deviation present.  Pulmonary/Chest: Effort normal. No stridor. No respiratory distress.  Abdominal: Soft. There is no tenderness. There is no rebound and no guarding.  Musculoskeletal: Normal range of motion.  Neurological: She is alert. GCS eye subscore is 4. GCS verbal subscore is 5. GCS motor subscore is 6.  Speech is clear and goal oriented, follows commands Major Cranial nerves without deficit, no facial droop Moves extremities without ataxia, coordination intact Normal gait  Skin: Skin is warm and dry.  Psychiatric: She has a normal mood and affect. Her behavior is normal.   ED Treatments / Results  Labs (all labs ordered are listed, but only abnormal results are displayed) Labs Reviewed - No data to display  EKG None  Radiology No results found.  Procedures Procedures (including critical care time)  Medications Ordered in  ED Medications  dexamethasone (DECADRON) injection 10 mg (10 mg Intramuscular Given 02/06/18 1903)     Initial Impression / Assessment and Plan / ED Course  I have reviewed the triage vital signs and the nursing notes.  Pertinent labs & imaging results that were available during my care of the patient were reviewed by me and considered in my medical decision making (see chart for details).  Clinical Course as of Feb 07 1919  Thu Feb 06, 2018  1837 Discussed with Dr. Ronnald Nian; advises decadron and ENT follow-up.   [BM]  2956 Discussed Care plan with patient. Shared decision making made; agrees with 10mg  Decadron and ENT follow-up tomorrow.   [BM]    Clinical Course User Index [BM] Deliah Boston, PA-C   Patient with history of peritonsillar abscess last incision and drainage 6 days ago followed by 5 days of clindamycin last taken yesterday.  Patient with some throat discomfort earlier today that has resolved after she spit out a small amount of drainage around 3 PM.  Physical examination does not show signs of peritonsillar abscess, retropharyngeal abscess, Ludwig's angina, dental abscess, preseptal/orbital cellulitis or other soft tissue infections of the head/neck at this time.  Patient has normal-appearing oropharynx, no tonsillar erythema/swelling or exudate present.  Patient has finished her clindamycin regimen as prescribed as of yesterday, do not suspect recurrent peritonsillar abscess at this time.  Patient has been given 10 mg of Decadron for comfort.  Shared decision making made with patient and her family member at bedside, patient agrees with plan of care at this time; 10 mg of Decadron and discharge.  Patient states that she is able to follow-up with her ENT, Dr. Janeice Robinson office tomorrow morning for further evaluation, I have encouraged this.  Patient is afebrile, not tachycardic, not tachypneic, resting comfortably in no acute distress.  Airway intact.  At this time there  does not appear to be any evidence of an acute emergency medical condition and the patient appears stable for discharge with appropriate outpatient follow up. Diagnosis was discussed with patient who verbalizes understanding of care plan and is agreeable to discharge. I have discussed return precautions with patient and family at bedside who verbalize understanding of return precautions. Patient strongly encouraged to follow-up with their ENT. All questions answered.  Patient's case discussed with Dr. Ronnald Nian who agrees with plan give decadron and discharge with ENT follow-up.     Note: Portions of this report may have been transcribed using voice recognition software. Every effort was made to ensure accuracy; however, inadvertent computerized transcription errors may still be present.   Final Clinical Impressions(s) / ED Diagnoses   Final diagnoses:  Sore throat    ED Discharge Orders    None       Deliah Boston, PA-C 02/06/18 1924    Lennice Sites, DO 02/07/18 0118

## 2018-03-23 ENCOUNTER — Encounter (HOSPITAL_COMMUNITY): Payer: Self-pay

## 2018-03-23 ENCOUNTER — Inpatient Hospital Stay (HOSPITAL_COMMUNITY)
Admission: AD | Admit: 2018-03-23 | Discharge: 2018-03-23 | Disposition: A | Discharge status: Home / Self Care | Payer: BC Managed Care – PPO | Source: Ambulatory Visit | Attending: Obstetrics & Gynecology | Admitting: Obstetrics & Gynecology

## 2018-03-23 DIAGNOSIS — N939 Abnormal uterine and vaginal bleeding, unspecified: Secondary | ICD-10-CM | POA: Diagnosis not present

## 2018-03-23 LAB — CBC
HCT: 40 % (ref 36.0–46.0)
HEMOGLOBIN: 12.7 g/dL (ref 12.0–15.0)
MCH: 28.2 pg (ref 26.0–34.0)
MCHC: 31.8 g/dL (ref 30.0–36.0)
MCV: 88.9 fL (ref 80.0–100.0)
Platelets: 325 10*3/uL (ref 150–400)
RBC: 4.5 MIL/uL (ref 3.87–5.11)
RDW: 14.1 % (ref 11.5–15.5)
WBC: 6.1 10*3/uL (ref 4.0–10.5)
nRBC: 0 % (ref 0.0–0.2)

## 2018-03-23 LAB — URINALYSIS, MICROSCOPIC (REFLEX): RBC / HPF: >50 RBC/hpf (ref 0–5)

## 2018-03-23 LAB — URINALYSIS, ROUTINE W REFLEX MICROSCOPIC
Glucose, UA: NEGATIVE mg/dL
Ketones, ur: NEGATIVE mg/dL
Nitrite: NEGATIVE
Protein, ur: 100 mg/dL — AB
Specific Gravity, Urine: >1.03 — ABNORMAL HIGH (ref 1.005–1.030)
pH: 5.5 (ref 5.0–8.0)

## 2018-03-23 LAB — TYPE AND SCREEN
ABO/RH(D): O POS
Antibody Screen: NEGATIVE

## 2018-03-23 MED ORDER — NORETHINDRONE 0.35 MG PO TABS: 1.0000 | ORAL_TABLET | Freq: Every day | ORAL | 11 refills | Status: DC

## 2018-03-23 MED ORDER — MEGESTROL ACETATE 40 MG PO TABS: 40.0000 mg | ORAL_TABLET | Freq: Two times a day (BID) | ORAL | 0 refills | Status: AC

## 2018-03-23 NOTE — MAU Note (Signed)
Negative pregnancy test QC machine not uploading  Luellen Pucker

## 2018-03-23 NOTE — MAU Provider Note (Signed)
History     CSN: [id number]  Arrival date and time: 03/23/18 1701   First Provider Initiated Contact with Patient 03/23/18 2000      Chief Complaint  Patient presents with  . Abdominal Pain  . Vaginal Bleeding  . Dizziness   HPI Leslie [name] is a 22 y.o. G1P1001 non pregnant female who presents with vaginal bleeding for 3 weeks. She states she stopped taking her birth control pills for 3 weeks and the bleeding started. She has had bleeding for 3 weeks since then. She reports intermittent dizziness. She reports menstrual cramps that she rates a 3/10 and has not tried anything for.   OB History    Gravida  1   Para  1   Term  1   Preterm      AB      Living  1     SAB      TAB      Ectopic      Multiple      Live Births  1           Past Medical History:  Diagnosis Date  . Peritonsillar abscess     Past Surgical History:  Procedure Laterality Date  . CESAREAN SECTION  09/26/2013  . INCISION AND DRAINAGE OF PERITONSILLAR ABCESS      Family History  Problem Relation Age of Onset  . Hypertension Mother   . Diabetes Maternal Grandmother   . Diabetes Maternal Grandfather     Social History   Tobacco Use  . Smoking status: Never Smoker  . Smokeless tobacco: Never Used  Substance Use Topics  . Alcohol use: No  . Drug use: No    Allergies: No Known Allergies  Medications Prior to Admission  Medication Sig Dispense Refill Last Dose  . cetirizine-pseudoephedrine (ZYRTEC-D) 5-120 MG tablet Take 1 tablet by mouth daily. 15 tablet 0 unknown  . fluticasone (FLONASE) 50 MCG/ACT nasal spray Place 2 sprays into both nostrils daily. 1 g 0 unknown  . ipratropium (ATROVENT) 0.06 % nasal spray Place 2 sprays into both nostrils 4 (four) times daily. 15 mL 0 01/30/2018 at Unknown time  . Norethindrone Acetate-Ethinyl Estradiol (JUNEL,LOESTRIN,MICROGESTIN) 1.5-30 MG-MCG tablet Take 1 tablet by mouth daily. 1 Package 11 Past Week at Unknown time    Review  of Systems  Constitutional: Negative.  Negative for fatigue and fever.  HENT: Negative.   Respiratory: Negative.  Negative for shortness of breath.   Cardiovascular: Negative.  Negative for chest pain.  Gastrointestinal: Positive for abdominal pain. Negative for constipation, diarrhea, nausea and vomiting.  Genitourinary: Positive for vaginal bleeding. Negative for dysuria.  Neurological: Positive for dizziness. Negative for headaches.   Physical Exam   Blood pressure 122/70, pulse 79, temperature 98.5 F (36.9 C), temperature source Oral, resp. rate 18, height 5\' 8"  (1.727 m), weight 126 kg.  Physical Exam  Nursing note and vitals reviewed. Constitutional: She is oriented to person, place, and time. She appears well-developed and well-nourished. No distress.  HENT:  Head: Normocephalic.  Eyes: Pupils are equal, round, and reactive to light.  Cardiovascular: Normal rate, regular rhythm and normal heart sounds.  Respiratory: Effort normal and breath sounds normal. No respiratory distress.  GI: Soft. Bowel sounds are normal. She exhibits no distension. There is no abdominal tenderness.  Genitourinary:    Genitourinary Comments: Scant amount of bright red bleeding. Bimanual exam: Cervix 0/long/high, firm, anterior, neg CMT, uterus nontender, nonenlarged, adnexa without tenderness, enlargement, or mass  Neurological: She is alert and oriented to person, place, and time.  Skin: Skin is warm and dry.  Psychiatric: She has a normal mood and affect. Her behavior is normal. Judgment and thought content normal.    MAU Course  Procedures Results for orders placed or performed during the hospital encounter of 03/23/18 (from the past 24 hour(s))  Urinalysis, Routine w reflex microscopic     Status: Abnormal   Collection Time: 03/23/18  6:03 PM  Result Value Ref Range   Color, Urine AMBER (A) YELLOW   APPearance HAZY (A) CLEAR   Specific Gravity, Urine >1.030 (H) 1.005 - 1.030   pH 5.5  5.0 - 8.0   Glucose, UA NEGATIVE NEGATIVE mg/dL   Hgb urine dipstick LARGE (A) NEGATIVE   Bilirubin Urine SMALL (A) NEGATIVE   Ketones, ur NEGATIVE NEGATIVE mg/dL   Protein, ur 100 (A) NEGATIVE mg/dL   Nitrite NEGATIVE NEGATIVE   Leukocytes, UA TRACE (A) NEGATIVE  Urinalysis, Microscopic (reflex)     Status: Abnormal   Collection Time: 03/23/18  6:03 PM  Result Value Ref Range   RBC / HPF >50 0 - 5 RBC/hpf   WBC, UA 0-5 0 - 5 WBC/hpf   Bacteria, UA RARE (A) NONE SEEN   Squamous Epithelial / LPF 0-5 0 - 5  CBC     Status: None   Collection Time: 03/23/18  7:31 PM  Result Value Ref Range   WBC 6.1 4.0 - 10.5 K/uL   RBC 4.50 3.87 - 5.11 MIL/uL   Hemoglobin 12.7 12.0 - 15.0 g/dL   HCT 40.0 36.0 - 46.0 %   MCV 88.9 80.0 - 100.0 fL   MCH 28.2 26.0 - 34.0 pg   MCHC 31.8 30.0 - 36.0 g/dL   RDW 14.1 11.5 - 15.5 %   Platelets 325 150 - 400 K/uL   nRBC 0.0 0.0 - 0.2 %   MDM UA, UPT CBC  Assessment and Plan   1. Abnormal uterine bleeding    -Discharge home in stable condition -Rx for megace sent to patient's pharmacy -Vaginal bleeding precautions discussed -Patient advised to follow-up with [facility] for management of abnormal bleeding.  -Patient may return to MAU as needed or if her condition were to change or worsen  Wende Mott CNM 03/23/2018, 8:00 PM

## 2018-03-23 NOTE — MAU Note (Signed)
Was on OCP and missed about 3 weeks worth of pills. Started cycle like normal but now has been bleeding for 3 weeks. Changing a pad a few times per day  Having some blood clots, few, small  Cramping, 8/10, lower abdomen, intermittent  Feeling lightheaded and cold  Unsure if pregnant

## 2018-03-23 NOTE — Discharge Instructions (Signed)
Abnormal Uterine Bleeding Abnormal uterine bleeding can affect women at various stages in life, including teenagers, women in their reproductive years, pregnant women, and women who have reached menopause. Several kinds of uterine bleeding are considered abnormal, including:  Bleeding or spotting between periods.  Bleeding after sexual intercourse.  Bleeding that is heavier or more than normal.  Periods that last longer than usual.  Bleeding after menopause. Many cases of abnormal uterine bleeding are minor and simple to treat, while others are more serious. Any type of abnormal bleeding should be evaluated by your health care provider. Treatment will depend on the cause of the bleeding. Follow these instructions at home: Monitor your condition for any changes. The following actions may help to alleviate any discomfort you are experiencing:  Avoid the use of tampons and douches as directed by your health care provider.  Change your pads frequently. You should get regular pelvic exams and Pap tests. Keep all follow-up appointments for diagnostic tests as directed by your health care provider. Contact a health care provider if:  Your bleeding lasts more than 1 week.  You feel dizzy at times. Get help right away if:  You pass out.  You are changing pads every 15 to 30 minutes.  You have abdominal pain.  You have a fever.  You become sweaty or weak.  You are passing large blood clots from the vagina.  You start to feel nauseous and vomit. This information is not intended to replace advice given to you by your health care provider. Make sure you discuss any questions you have with your health care provider. Document Released: 03/26/2005 Document Revised: 09/07/2015 Document Reviewed: 10/23/2012 Elsevier Interactive Patient Education  2017 [employer]  

## 2018-03-24 LAB — ABO/RH: ABO/RH(D): O POS

## 2018-03-24 LAB — POCT PREGNANCY, URINE: Preg Test, Ur: NEGATIVE

## 2018-04-25 ENCOUNTER — Encounter (HOSPITAL_COMMUNITY): Payer: Self-pay

## 2018-04-25 ENCOUNTER — Other Ambulatory Visit: Payer: Self-pay

## 2018-04-25 ENCOUNTER — Emergency Department (HOSPITAL_COMMUNITY)
Admission: EM | Admit: 2018-04-25 | Discharge: 2018-04-25 | Disposition: A | Discharge status: Home / Self Care | Payer: 59 | Attending: Emergency Medicine | Admitting: Emergency Medicine

## 2018-04-25 DIAGNOSIS — J039 Acute tonsillitis, unspecified: Secondary | ICD-10-CM | POA: Diagnosis not present

## 2018-04-25 DIAGNOSIS — Z79899 Other long term (current) drug therapy: Secondary | ICD-10-CM | POA: Insufficient documentation

## 2018-04-25 DIAGNOSIS — R070 Pain in throat: Secondary | ICD-10-CM | POA: Diagnosis present

## 2018-04-25 DIAGNOSIS — J029 Acute pharyngitis, unspecified: Secondary | ICD-10-CM

## 2018-04-25 LAB — GROUP A STREP BY PCR: Group A Strep by PCR: NOT DETECTED

## 2018-04-25 MED ORDER — PREDNISONE 20 MG PO TABS: ORAL_TABLET | ORAL | 0 refills | Status: DC

## 2018-04-25 MED ORDER — CLINDAMYCIN PHOSPHATE 600 MG/50ML IV SOLN
600.0000 mg | Freq: Once | INTRAVENOUS | Status: AC
  Administered 2018-04-25: 600 mg via INTRAVENOUS
  Filled 2018-04-25: qty 50

## 2018-04-25 MED ORDER — CLINDAMYCIN HCL 300 MG PO CAPS: 300.0000 mg | ORAL_CAPSULE | Freq: Three times a day (TID) | ORAL | 0 refills | Status: AC

## 2018-04-25 MED ORDER — DEXAMETHASONE SODIUM PHOSPHATE 10 MG/ML IJ SOLN
10.0000 mg | Freq: Once | INTRAMUSCULAR | Status: AC
  Administered 2018-04-25: 10 mg via INTRAVENOUS
  Filled 2018-04-25: qty 1

## 2018-04-25 NOTE — ED Triage Notes (Signed)
Pt reports an extensive hx of peritonsillar abscesses and feels one coming on. She has been admitted for same. She denies SOB. She states that she wants to "get ahead of it."

## 2018-04-25 NOTE — Discharge Instructions (Addendum)
You do not appear to have a peritonsillar abscess at this time. Take clindamycin as directed until completed. Take prednisone as directed until completed, starting tomorrow since you received today's dose here. Gargle salt water and use over the counter chloraseptic spray as needed to help with sore throat. Follow up with the ENT doctor in 3-5 days for recheck of symptoms and to discuss possibly having your tonsils removed. Return to the ER for emergent changes or worsening symptoms.

## 2018-04-25 NOTE — ED Provider Notes (Signed)
[facility] DEPT Provider Note   CSN: [id number] Arrival date & time: 04/25/18  1715     History   Chief Complaint Chief Complaint  Patient presents with  . Sore Throat    hx of peritonsillar abscess    HPI Leslie [name] is a 23 y.o. 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 Constance Holster), who presents to the ED with complaints of sore throat that began yesterday.  Patient is concerned because she has had multiple peritonsillar abscesses in the past, all of which have required drainage, so she wanted to "get ahead of it".  She started having a sore throat last night, which she describes as 8/10 constant throbbing nonradiating right-sided sore throat, worse with swallowing and eating, and unrelieved with ibuprofen.  She states that her throat feels tight like her tonsils are swollen.  She also reports rhinorrhea.  She has not been around anybody that sick with the symptoms.  She denies any drooling, trismus, ear pain or drainage, cough, fevers, chills, CP, SOB, abd pain, N/V/D/C, hematuria, dysuria, myalgias, arthralgias, numbness, tingling, focal weakness, or any other complaints at this time.   The history is provided by the patient and medical records. No language interpreter was used.  Sore Throat  Pertinent negatives include no chest pain, no abdominal pain and no shortness of breath.    Past Medical History:  Diagnosis Date  . Peritonsillar abscess     Patient Active Problem List   Diagnosis Date Noted  . IUD strings lost 07/24/2017  . Pure hypercholesterolemia 06/12/2016    Past Surgical History:  Procedure Laterality Date  . CESAREAN SECTION  09/26/2013  . INCISION AND DRAINAGE OF PERITONSILLAR ABCESS       OB History    Gravida  1   Para  1   Term  1   Preterm      AB      Living  1     SAB      TAB      Ectopic      Multiple      Live Births  1             Home Medications    Prior to Admission medications   Medication Sig Start Date End Date Taking? Authorizing Provider  cetirizine-pseudoephedrine (ZYRTEC-D) 5-120 MG tablet Take 1 tablet by mouth daily. 01/28/18   Tasia Catchings, Amy V, PA-C  fluticasone (FLONASE) 50 MCG/ACT nasal spray Place 2 sprays into both nostrils daily. 01/28/18   Tasia Catchings, Amy V, PA-C  ipratropium (ATROVENT) 0.06 % nasal spray Place 2 sprays into both nostrils 4 (four) times daily. 01/28/18   Tasia Catchings, Amy V, PA-C  norethindrone (ORTHO MICRONOR) 0.35 MG tablet Take 1 tablet (0.35 mg total) by mouth daily. 03/23/18   Wende Mott, CNM    Family History Family History  Problem Relation Age of Onset  . Hypertension Mother   . Diabetes Maternal Grandmother   . Diabetes Maternal Grandfather     Social History Social History   Tobacco Use  . Smoking status: Never Smoker  . Smokeless tobacco: Never Used  Substance Use Topics  . Alcohol use: No  . Drug use: No     Allergies   Patient has no known allergies.   Review of Systems Review of Systems  Constitutional: Negative for chills and fever.  HENT: Positive for rhinorrhea and sore throat. Negative for drooling, ear discharge,  ear pain and trouble swallowing.   Respiratory: Negative for shortness of breath.   Cardiovascular: Negative for chest pain.  Gastrointestinal: Negative for abdominal pain, constipation, diarrhea, nausea and vomiting.  Genitourinary: Negative for dysuria and hematuria.  Musculoskeletal: Negative for arthralgias and myalgias.  Skin: Negative for color change.  Allergic/Immunologic: Negative for immunocompromised state.  Neurological: Negative for weakness and numbness.  Psychiatric/Behavioral: Negative for confusion.   All other systems reviewed and are negative for acute change except as noted in the HPI.    Physical Exam Updated Vital Signs BP 132/90 (BP Location: Left Arm)   Pulse 85   Temp 98.5 F (36.9 C) (Oral)   Resp 18   SpO2  100%   Physical Exam Vitals signs and nursing note reviewed.  Constitutional:      General: She is not in acute distress.    Appearance: Normal appearance. She is well-developed. She is not toxic-appearing.     Comments: Afebrile, nontoxic, NAD  HENT:     Head: Normocephalic and atraumatic.     Right Ear: Hearing, tympanic membrane, ear canal and external ear normal.     Left Ear: Hearing, tympanic membrane, ear canal and external ear normal.     Nose: Rhinorrhea present.     Mouth/Throat:     Mouth: Mucous membranes are moist.     Pharynx: Oropharynx is clear. Uvula midline. Posterior oropharyngeal erythema present. No pharyngeal swelling, oropharyngeal exudate or uvula swelling.     Tonsils: No tonsillar exudate or tonsillar abscesses. Swelling: 1+ on the right. 1+ on the left.     Comments: Ears are clear bilaterally. Nose with mild rhinorrhea. Oropharynx mildly injected, without uvular swelling or deviation, no trismus or drooling, with 1-2+ b/l tonsillar swelling/hypertrophy and erythema, no exudates. No PTA.   Eyes:     General:        Right eye: No discharge.        Left eye: No discharge.     Conjunctiva/sclera: Conjunctivae normal.  Neck:     Musculoskeletal: Normal range of motion and neck supple.  Cardiovascular:     Rate and Rhythm: Normal rate and regular rhythm.     Pulses: Normal pulses.     Heart sounds: Normal heart sounds, S1 normal and S2 normal. No murmur. No friction rub. No gallop.   Pulmonary:     Effort: Pulmonary effort is normal. No respiratory distress.     Breath sounds: Normal breath sounds. No decreased breath sounds, wheezing, rhonchi or rales.  Abdominal:     General: Bowel sounds are normal. There is no distension.     Palpations: Abdomen is soft. Abdomen is not rigid.     Tenderness: There is no abdominal tenderness. There is no right CVA tenderness, left CVA tenderness, guarding or rebound. Negative signs include Murphy's sign and McBurney's sign.   Musculoskeletal: Normal range of motion.  Lymphadenopathy:     Head:     Right side of head: Tonsillar adenopathy present.     Left side of head: Tonsillar adenopathy present.     Cervical: Cervical adenopathy present.     Comments: Shotty cervical and tonsillar LAD bilaterally which is mildly TTP  Skin:    General: Skin is warm and dry.     Findings: No rash.  Neurological:     Mental Status: She is alert and oriented to person, place, and time.     Sensory: Sensation is intact. No sensory deficit.     Motor: Motor  function is intact.  Psychiatric:        Mood and Affect: Mood and affect normal.        Behavior: Behavior normal.      ED Treatments / Results  Labs (all labs ordered are listed, but only abnormal results are displayed) Labs Reviewed  GROUP A STREP BY PCR    EKG None  Radiology No results found.  Procedures Procedures (including critical care time)  Medications Ordered in ED Medications  dexamethasone (DECADRON) injection 10 mg (10 mg Intravenous Given 04/25/18 1910)  clindamycin (CLEOCIN) IVPB 600 mg (0 mg Intravenous Stopped 04/25/18 2122)     Initial Impression / Assessment and Plan / ED Course  I have reviewed the triage vital signs and the nursing notes.  Pertinent labs & imaging results that were available during my care of the patient were reviewed by me and considered in my medical decision making (see chart for details).     23 y.o. female here with sore throat x 1 day, hx of recurrent peritonsillar abscesses, wants to "get ahead of it". On exam, 1-2+ b/l tonsillar swelling and erythema, no exudates, without uvular deviation or peritonsillar abscess noted, ears clear, mild rhinorrhea, shotty cervical LAD bilaterally which is mildly TTP. Discussed that there is no peritonsillar abscess on exam, doubt need for imaging at this point, will get strep test and give decadron to help with tonsillar swelling and pain and reassess. May consider d/c with  clindamycin to help avoid formation of PTA. Will reassess shortly.   7:58 PM Pt feeling like the swelling is worsening despite decadron, repeat eval of throat demonstrates progressive swelling to the R tonsil, now 2-3+, no uvular deviation but the swelling has definitely increased. Will consult ENT to discuss case, since this has happened multiple times in the past and each time she has an abscess that needs to be drained.   8:22 PM Strep test negative. Dr. Wilburn Cornelia of ENT returning page, does not feel this warrants I&D at this time given no definite PTA on exam, recommends 600mg  IV clindamycin and send home with PO clindamycin 300mg  TID and have her f/up. Discussed this with pt and she is agreeable to this plan. Will reassess once clindamycin has been given.   9:23 PM Clindamycin finished. Tonsils about the same, perhaps slightly better, no worsening, still no PTA noted. Will send home with PO clinda, and PO prednisone to start tomorrow. Advised other OTC remedies for symptomatic relief. F/up with ENT in 3-5 days for recheck of symptoms and to discuss possible tonsillectomy. I explained the diagnosis and have given explicit precautions to return to the ER including for any other new or worsening symptoms. The patient understands and accepts the medical plan as it's been dictated and I have answered their questions. Discharge instructions concerning home care and prescriptions have been given. The patient is STABLE and is discharged to home in good condition.    Final Clinical Impressions(s) / ED Diagnoses   Final diagnoses:  Sore throat  Tonsillitis    ED Discharge Orders         Ordered    clindamycin (CLEOCIN) 300 MG capsule  3 times daily     04/25/18 2120    predniSONE (DELTASONE) 20 MG tablet     04/25/18 [address] [place], [place] 04/25/18 2123    Milton Ferguson, MD 04/25/18 2318

## 2018-04-27 ENCOUNTER — Emergency Department (HOSPITAL_COMMUNITY)
Admission: EM | Admit: 2018-04-27 | Discharge: 2018-04-27 | Disposition: A | Discharge status: Home / Self Care | Payer: 59 | Attending: Emergency Medicine | Admitting: Emergency Medicine

## 2018-04-27 ENCOUNTER — Emergency Department (HOSPITAL_COMMUNITY): Payer: 59

## 2018-04-27 ENCOUNTER — Encounter (HOSPITAL_COMMUNITY): Payer: Self-pay | Admitting: *Deleted

## 2018-04-27 ENCOUNTER — Other Ambulatory Visit: Payer: Self-pay

## 2018-04-27 DIAGNOSIS — Z79899 Other long term (current) drug therapy: Secondary | ICD-10-CM | POA: Diagnosis not present

## 2018-04-27 DIAGNOSIS — J039 Acute tonsillitis, unspecified: Secondary | ICD-10-CM | POA: Insufficient documentation

## 2018-04-27 DIAGNOSIS — J029 Acute pharyngitis, unspecified: Secondary | ICD-10-CM | POA: Diagnosis present

## 2018-04-27 LAB — CBC WITH DIFFERENTIAL/PLATELET
Abs Immature Granulocytes: 0.11 10*3/uL — ABNORMAL HIGH (ref 0.00–0.07)
BASOS PCT: 0 %
Basophils Absolute: 0 10*3/uL (ref 0.0–0.1)
Eosinophils Absolute: 0 10*3/uL (ref 0.0–0.5)
Eosinophils Relative: 0 %
HCT: 41 % (ref 36.0–46.0)
Hemoglobin: 12.8 g/dL (ref 12.0–15.0)
Immature Granulocytes: 1 %
Lymphocytes Relative: 10 %
Lymphs Abs: 1.4 10*3/uL (ref 0.7–4.0)
MCH: 28 pg (ref 26.0–34.0)
MCHC: 31.2 g/dL (ref 30.0–36.0)
MCV: 89.7 fL (ref 80.0–100.0)
MONOS PCT: 2 %
Monocytes Absolute: 0.3 10*3/uL (ref 0.1–1.0)
Neutro Abs: 12.2 10*3/uL — ABNORMAL HIGH (ref 1.7–7.7)
Neutrophils Relative %: 87 %
Platelets: 314 10*3/uL (ref 150–400)
RBC: 4.57 MIL/uL (ref 3.87–5.11)
RDW: 14.3 % (ref 11.5–15.5)
WBC: 14 10*3/uL — ABNORMAL HIGH (ref 4.0–10.5)
nRBC: 0 % (ref 0.0–0.2)

## 2018-04-27 LAB — BASIC METABOLIC PANEL
Anion gap: 7 (ref 5–15)
BUN: 14 mg/dL (ref 6–20)
CALCIUM: 8.8 mg/dL — AB (ref 8.9–10.3)
CO2: 22 mmol/L (ref 22–32)
Chloride: 107 mmol/L (ref 98–111)
Creatinine, Ser: 0.75 mg/dL (ref 0.44–1.00)
GFR calc Af Amer: >60 mL/min (ref >60)
Glucose, Bld: 157 mg/dL — ABNORMAL HIGH (ref 70–99)
Potassium: 4 mmol/L (ref 3.5–5.1)
Sodium: 136 mmol/L (ref 135–145)

## 2018-04-27 LAB — I-STAT BETA HCG BLOOD, ED (MC, WL, AP ONLY): I-stat hCG, quantitative: <5 m[IU]/mL (ref <5)

## 2018-04-27 MED ORDER — SODIUM CHLORIDE 0.9 % IV SOLN
1.0000 g | Freq: Once | INTRAVENOUS | Status: AC
  Administered 2018-04-27: 1 g via INTRAVENOUS
  Filled 2018-04-27: qty 10

## 2018-04-27 MED ORDER — SODIUM CHLORIDE (PF) 0.9 % IJ SOLN
INTRAMUSCULAR | Status: AC
  Administered 2018-04-27: 09:00:00
  Filled 2018-04-27: qty 50

## 2018-04-27 MED ORDER — SODIUM CHLORIDE 0.9 % IV BOLUS
500.0000 mL | Freq: Once | INTRAVENOUS | Status: AC
  Administered 2018-04-27: 500 mL via INTRAVENOUS

## 2018-04-27 MED ORDER — DEXAMETHASONE SODIUM PHOSPHATE 10 MG/ML IJ SOLN
10.0000 mg | Freq: Once | INTRAMUSCULAR | Status: AC
  Administered 2018-04-27: 10 mg via INTRAVENOUS
  Filled 2018-04-27: qty 1

## 2018-04-27 MED ORDER — IOHEXOL 300 MG/ML  SOLN
75.0000 mL | Freq: Once | INTRAMUSCULAR | Status: AC | PRN
  Administered 2018-04-27: 75 mL via INTRAVENOUS

## 2018-04-27 NOTE — ED Provider Notes (Signed)
[facility] DEPT Provider Note   CSN: [id number] Arrival date & time: 04/27/18  2836     History   Chief Complaint Chief Complaint  Patient presents with  . Abscess    HPI Leslie [name] is a 23 y.o. female.  HPI   She is here for right-sided throat pain and swelling starting several days ago, she feels it is worsening despite taking clindamycin for infection.  She is concerned that she needs a drainage procedure to improve the swelling.  Denies fever, chills, weakness or dizziness.  She states it is "getting harder to eat."  Evaluated here 2 days ago.  Prior peritonsillar abscess requiring drainage.  There are no other no modifying factors.  Past Medical History:  Diagnosis Date  . Peritonsillar abscess     Patient Active Problem List   Diagnosis Date Noted  . IUD strings lost 07/24/2017  . Pure hypercholesterolemia 06/12/2016    Past Surgical History:  Procedure Laterality Date  . CESAREAN SECTION  09/26/2013  . INCISION AND DRAINAGE OF PERITONSILLAR ABCESS       OB History    Gravida  1   Para  1   Term  1   Preterm      AB      Living  1     SAB      TAB      Ectopic      Multiple      Live Births  1            Home Medications    Prior to Admission medications   Medication Sig Start Date End Date Taking? Authorizing Provider  clindamycin (CLEOCIN) 300 MG capsule Take 1 capsule (300 mg total) by mouth 3 (three) times daily for 7 days. 04/25/18 05/02/18 Yes Street, [place], PA-C  Multiple Vitamins-Minerals (ZINC PO) Take 1 tablet by mouth daily.   Yes [provider]  predniSONE (DELTASONE) 20 MG tablet 3 tabs po daily x 4 days STARTING 04/26/18 Patient taking differently: Take 60 mg by mouth daily.  04/25/18  Yes Street, [place], PA-C  vitamin C (ASCORBIC ACID) 500 MG tablet Take 500 mg by mouth daily.   Yes [provider]    Family History Family History  Problem Relation Age of Onset    . Hypertension Mother   . Diabetes Maternal Grandmother   . Diabetes Maternal Grandfather     Social History Social History   Tobacco Use  . Smoking status: Never Smoker  . Smokeless tobacco: Never Used  Substance Use Topics  . Alcohol use: No  . Drug use: No     Allergies   Patient has no known allergies.   Review of Systems Review of Systems  All other systems reviewed and are negative.    Physical Exam Updated Vital Signs BP 127/76 (BP Location: Left Arm)   Pulse 77   Temp 98.9 F (37.2 C) (Oral)   Resp 18   Ht 5\' 8"  (1.727 m)   Wt 125.6 kg   LMP 04/02/2018 (Approximate)   SpO2 100%   BMI 42.12 kg/m   Physical Exam Vitals signs and nursing note reviewed.  Constitutional:      Appearance: She is well-developed.  HENT:     Head: Normocephalic and atraumatic.     Right Ear: External ear normal.     Left Ear: External ear normal.     Nose: Nose normal.     Mouth/Throat:  Pharynx: No oropharyngeal exudate.     Comments: Minimal right peritonsillar swelling, uvula midline.  No tonsillar hypertrophy or exudate.  Normal phonation.  No stridor. Eyes:     Conjunctiva/sclera: Conjunctivae normal.     Pupils: Pupils are equal, round, and reactive to light.  Neck:     Musculoskeletal: Normal range of motion and neck supple.     Trachea: Phonation normal.  Cardiovascular:     Rate and Rhythm: Normal rate and regular rhythm.     Heart sounds: Normal heart sounds.  Pulmonary:     Effort: Pulmonary effort is normal. No respiratory distress.     Breath sounds: Normal breath sounds. No stridor. No rhonchi.  Musculoskeletal: Normal range of motion.  Skin:    General: Skin is warm and dry.  Neurological:     Mental Status: She is alert and oriented to person, place, and time.     Cranial Nerves: No cranial nerve deficit.     Sensory: No sensory deficit.     Motor: No abnormal muscle tone.     Coordination: Coordination normal.  Psychiatric:         Behavior: Behavior normal.        Thought Content: Thought content normal.        Judgment: Judgment normal.      ED Treatments / Results  Labs (all labs ordered are listed, but only abnormal results are displayed) Labs Reviewed  CBC WITH DIFFERENTIAL/PLATELET - Abnormal; Notable for the following components:      Result Value   WBC 14.0 (*)    Neutro Abs 12.2 (*)    Abs Immature Granulocytes 0.11 (*)    All other components within normal limits  BASIC METABOLIC PANEL - Abnormal; Notable for the following components:   Glucose, Bld 157 (*)    Calcium 8.8 (*)    All other components within normal limits  I-STAT BETA HCG BLOOD, ED ([facility], [facility], [facility] ONLY)    EKG None  Radiology Ct Soft Tissue Neck W Contrast  Result Date: 04/27/2018 CLINICAL DATA:  Sore throat, stridor. Rule out tonsillitis epiglottitis. EXAM: CT NECK WITH CONTRAST TECHNIQUE: Multidetector CT imaging of the neck was performed using the standard protocol following the bolus administration of intravenous contrast. CONTRAST:  49mL OMNIPAQUE IOHEXOL 300 MG/ML  SOLN COMPARISON:  CT neck 11/01/2017 FINDINGS: Pharynx and larynx: Mild asymmetry of the right tonsil which is mildly enlarged well-defined low-density. No abscess identified. Note is made of a small abscess in the right tonsil on the prior study. Mild enlargement of the adenoid bilaterally. Airway intact. Epiglottis and larynx normal. Salivary glands: No inflammation, mass, or stone. Thyroid: Negative Lymph nodes: No enlarged lymph nodes in the neck. Right level 2 lymph node 9 mm. Left level 2 lymph node 13 mm. Vascular: Normal vascular enhancement Limited intracranial: Negative Visualized orbits: Negative Mastoids and visualized paranasal sinuses: Mild mucosal edema paranasal sinuses. Mastoid sinus clear bilaterally. Skeleton: No acute skeletal abnormality. Upper chest: Negative Other: None IMPRESSION: Mild asymmetric enlargement right tonsil without abscess. Normal airway.  Electronically Signed   By: Franchot Gallo M.D.   On: 04/27/2018 09:40    Procedures Procedures (including critical care time)  Medications Ordered in ED Medications  sodium chloride 0.9 % bolus 500 mL (0 mLs Intravenous Stopped 04/27/18 1050)  cefTRIAXone (ROCEPHIN) 1 g in sodium chloride 0.9 % 100 mL IVPB (0 g Intravenous Stopped 04/27/18 0918)  dexamethasone (DECADRON) injection 10 mg (10 mg Intravenous Given 04/27/18 0808)  sodium chloride (PF) 0.9 % injection (  Given by Other 04/27/18 0918)  iohexol (OMNIPAQUE) 300 MG/ML solution 75 mL (75 mLs Intravenous Contrast Given 04/27/18 0921)     Initial Impression / Assessment and Plan / ED Course  I have reviewed the triage vital signs and the nursing notes.  Pertinent labs & imaging results that were available during my care of the patient were reviewed by me and considered in my medical decision making (see chart for details).  Clinical Course as of Apr 27 1050  Sun Apr 27, 2018  1051 Normal  I-Stat beta hCG blood, ED [EW]  1052 Normal except glucose high, calcium low  Basic metabolic panel(!) [EW]  1027 Normal except white count high  CBC with Differential(!) [EW]  1052 No abscess, images reviewed by me  CT Soft Tissue Neck W Contrast [EW]    Clinical Course User Index [EW] Daleen Bo, MD     Patient Vitals for the past 24 hrs:  BP Temp Temp src Pulse Resp SpO2 Height Weight  04/27/18 1050 127/76 98.9 F (37.2 C) Oral - 18 100 % - -  04/27/18 0814 132/84 - - 77 20 100 % - -  04/27/18 0637 (!) 165/102 - - 91 19 98 % 5\' 8"  (1.727 m) 125.6 kg    At discharge- reevaluation with update and discussion. After initial assessment and treatment, an updated evaluation reveals she is comfortable has no further complaints, findings discussed and questions answered. Daleen Bo   Medical Decision Making: Evaluation consistent with tonsillitis without signs of peritonsillar abscess.  Patient currently being treated with  antibiotics has previously been referred to ENT.  Doubt sepsis, or metabolic instability.  CRITICAL CARE-no Performed by: Daleen Bo   Nursing Notes Reviewed/ Care Coordinated Applicable Imaging Reviewed Interpretation of Laboratory Data incorporated into ED treatment  The patient appears reasonably screened and/or stabilized for discharge and I doubt any other medical condition or other [facility] requiring further screening, evaluation, or treatment in the ED at this time prior to discharge.  Plan: Home Medications-continue current medications; Home Treatments-rest, fluids; return here if the recommended treatment, does not improve the symptoms; Recommended follow up-ENT follow-up.     Final Clinical Impressions(s) / ED Diagnoses   Final diagnoses:  Tonsillitis    ED Discharge Orders    None       Daleen Bo, MD 04/27/18 1052

## 2018-04-27 NOTE — ED Notes (Signed)
Patient appears to have swelling on the right tonsillar region. Patient states swelling is beginning to impede her ability to swallow. Patient appears to be able to breath with no difficulty. This RN will continue to monitor. Plan of care was discussed with the patient.

## 2018-04-27 NOTE — ED Triage Notes (Signed)
Pt stated "Was here on Friday, given decadron & clindamycin.  They sent me home with clindamycin and prednisone.  This is the 8th time I've been here for a peritonsillar abscess.  I feel like my throat is starting to close up."

## 2018-04-27 NOTE — ED Notes (Signed)
Bed: WA23 Expected date:  Expected time:  Means of arrival:  Comments: 

## 2018-05-26 ENCOUNTER — Encounter (HOSPITAL_COMMUNITY): Payer: Self-pay | Admitting: *Deleted

## 2018-05-26 ENCOUNTER — Inpatient Hospital Stay (HOSPITAL_COMMUNITY)
Admission: AD | Admit: 2018-05-26 | Discharge: 2018-05-26 | Disposition: A | Discharge status: Home / Self Care | Payer: 59 | Attending: Obstetrics & Gynecology | Admitting: Obstetrics & Gynecology

## 2018-05-26 ENCOUNTER — Other Ambulatory Visit: Payer: Self-pay

## 2018-05-26 DIAGNOSIS — Z3202 Encounter for pregnancy test, result negative: Secondary | ICD-10-CM | POA: Diagnosis not present

## 2018-05-26 DIAGNOSIS — R1030 Lower abdominal pain, unspecified: Secondary | ICD-10-CM | POA: Insufficient documentation

## 2018-05-26 DIAGNOSIS — N939 Abnormal uterine and vaginal bleeding, unspecified: Secondary | ICD-10-CM | POA: Insufficient documentation

## 2018-05-26 HISTORY — DX: Other specified health status: Z78.9

## 2018-05-26 LAB — URINALYSIS, MICROSCOPIC (REFLEX): RBC / HPF: >50 RBC/hpf (ref 0–5)

## 2018-05-26 LAB — URINALYSIS, ROUTINE W REFLEX MICROSCOPIC
Bilirubin Urine: NEGATIVE
Glucose, UA: NEGATIVE mg/dL
Ketones, ur: NEGATIVE mg/dL
LEUKOCYTE UA: NEGATIVE
Nitrite: NEGATIVE
Protein, ur: NEGATIVE mg/dL
Specific Gravity, Urine: >1.03 — ABNORMAL HIGH (ref 1.005–1.030)
pH: 5.5 (ref 5.0–8.0)

## 2018-05-26 LAB — WET PREP, GENITAL
Clue Cells Wet Prep HPF POC: NONE SEEN
Sperm: NONE SEEN
Trich, Wet Prep: NONE SEEN
Yeast Wet Prep HPF POC: NONE SEEN

## 2018-05-26 LAB — CBC WITH DIFFERENTIAL/PLATELET
BASOS ABS: 0 10*3/uL (ref 0.0–0.1)
Basophils Relative: 0 %
EOS PCT: 2 %
Eosinophils Absolute: 0.1 10*3/uL (ref 0.0–0.5)
HCT: 38.9 % (ref 36.0–46.0)
Hemoglobin: 12.5 g/dL (ref 12.0–15.0)
Lymphocytes Relative: 50 %
Lymphs Abs: 2.8 10*3/uL (ref 0.7–4.0)
MCH: 28.6 pg (ref 26.0–34.0)
MCHC: 32.1 g/dL (ref 30.0–36.0)
MCV: 89 fL (ref 80.0–100.0)
Monocytes Absolute: 0.3 10*3/uL (ref 0.1–1.0)
Monocytes Relative: 5 %
Neutro Abs: 2.3 10*3/uL (ref 1.7–7.7)
Neutrophils Relative %: 43 %
Platelets: 331 10*3/uL (ref 150–400)
RBC: 4.37 MIL/uL (ref 3.87–5.11)
RDW: 14.3 % (ref 11.5–15.5)
WBC: 5.5 10*3/uL (ref 4.0–10.5)
nRBC: 0 % (ref 0.0–0.2)

## 2018-05-26 LAB — POCT PREGNANCY, URINE: Preg Test, Ur: NEGATIVE

## 2018-05-26 LAB — HCG, QUANTITATIVE, PREGNANCY: hCG, Beta Chain, Quant, S: 1 m[IU]/mL (ref <5)

## 2018-05-26 MED ORDER — MEGESTROL ACETATE 40 MG PO TABS: 40.0000 mg | ORAL_TABLET | Freq: Two times a day (BID) | ORAL | 0 refills | Status: AC

## 2018-05-26 NOTE — MAU Provider Note (Signed)
History     CSN: [id number]  Arrival date and time: 05/26/18 1714   First Provider Initiated Contact with Patient 05/26/18 2005      Chief Complaint  Patient presents with  . Vaginal Bleeding  . Possible Pregnancy   HPI Leslie [name] is a 23 y.o. non pregnant patient who presents to MAU with chief complaints of heavy bleeding and lower abdominal cramping. She denies urinary symptoms, flank pain, SOB, weakness, fever or recent illness.  Heavy vaginal bleeding This is a recurring problem. Current episode began Saturday 05/24/2018. Patient endorses irregular menstrual cycles for which she was prescribed Megace in December 2019. She has been using OCPs for contraception but her use is inconsistent and she and her partner are discussing pregnancy. She verbalizes that she thought the Megace was prescribed to "get my cycles back on track", but did not realize she was supposed to follow up with GYN in clinic to further address her irregular cycles.   Abdominal Pain This is a new problem, onset 05/24/2018 coinciding with the onset of her bleeding. Patient states her cramping resolved without intervention this morning. Prior to that, it was mild, located bilaterally in her lower abdomen. She did not take medication for this problem. Patient is concerned she had a miscarriage.   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 Medical history non-contributory   . Peritonsillar abscess     Past Surgical History:  Procedure Laterality Date  . CESAREAN SECTION  09/26/2013  . INCISION AND DRAINAGE OF PERITONSILLAR ABCESS      Family History  Problem Relation Age of Onset  . Hypertension Mother   . Diabetes  Maternal Grandmother   . Diabetes Maternal Grandfather     Social History   Tobacco Use  . Smoking status: Never Smoker  . Smokeless tobacco: Never Used  Substance Use Topics  . Alcohol use: No  . Drug use: No    Allergies: No Known Allergies  Medications Prior to Admission  Medication Sig Dispense Refill Last Dose  . Multiple Vitamins-Minerals (ZINC PO) Take 1 tablet by mouth daily.   04/26/2018 at Unknown time  . predniSONE (DELTASONE) 20 MG tablet 3 tabs po daily x 4 days STARTING 04/26/18 (Patient taking differently: Take 60 mg by mouth daily. ) 12 tablet 0 04/27/2018 at Unknown time  . vitamin C (ASCORBIC ACID) 500 MG tablet Take 500 mg by mouth daily.   Past Week at Unknown time    Review of Systems  Constitutional: Negative for chills, fatigue and fever.  Respiratory: Negative for shortness of breath.   Gastrointestinal: Positive for abdominal pain.  Genitourinary: Positive for vaginal bleeding. Negative for difficulty urinating, vaginal discharge and vaginal pain.  Musculoskeletal: Negative for back pain.  Neurological: Negative for dizziness, syncope, weakness, light-headedness and headaches.  All other systems reviewed and are negative.  Physical Exam   Blood pressure (!) 149/68, pulse 80, temperature 98.1 F (36.7 C), temperature source Oral, resp. rate 20, weight 128.1 kg, last menstrual period 05/03/2018, SpO2 99 %.  Physical Exam  Nursing note and vitals reviewed. Constitutional: She is oriented to person, place, and time. She appears well-developed and well-nourished.  Cardiovascular: Normal rate.  Respiratory: Effort normal. No respiratory distress.  GI: Soft. She exhibits no distension. There is no abdominal  tenderness. There is no rebound, no guarding and no CVA tenderness.  Genitourinary:    Uterus normal.     Vaginal discharge present.     Genitourinary Comments: Scant dark red bleeding removed with fox swab x 1   Neurological: She is alert and oriented  to person, place, and time.  Skin: Skin is warm and dry.  Psychiatric: She has a normal mood and affect. Her behavior is normal. Judgment and thought content normal.    MAU Course  Procedures: sterile speculum exam  --Discussed with patient and partner low suspicion of recent miscarriage based on irregular cycles, irregular use of OCPs and hCG of 1 Patient Vitals for the past 24 hrs:  BP Temp Temp src Pulse Resp SpO2 Weight  05/26/18 2111 135/80 - - 84 19 - -  05/26/18 1743 (!) 149/68 98.1 F (36.7 C) Oral 80 20 99 % 128.1 kg    Results for orders placed or performed during the hospital encounter of 05/26/18 (from the past 24 hour(s))  Urinalysis, Routine w reflex microscopic     Status: Abnormal   Collection Time: 05/26/18  6:00 PM  Result Value Ref Range   Color, Urine AMBER (A) YELLOW   APPearance CLOUDY (A) CLEAR   Specific Gravity, Urine >1.030 (H) 1.005 - 1.030   pH 5.5 5.0 - 8.0   Glucose, UA NEGATIVE NEGATIVE mg/dL   Hgb urine dipstick LARGE (A) NEGATIVE   Bilirubin Urine NEGATIVE NEGATIVE   Ketones, ur NEGATIVE NEGATIVE mg/dL   Protein, ur NEGATIVE NEGATIVE mg/dL   Nitrite NEGATIVE NEGATIVE   Leukocytes,Ua NEGATIVE NEGATIVE  Urinalysis, Microscopic (reflex)     Status: Abnormal   Collection Time: 05/26/18  6:00 PM  Result Value Ref Range   RBC / HPF >50 0 - 5 RBC/hpf   WBC, UA 0-5 0 - 5 WBC/hpf   Bacteria, UA MANY (A) NONE SEEN   Squamous Epithelial / LPF 6-10 0 - 5  Pregnancy, urine POC     Status: None   Collection Time: 05/26/18  6:11 PM  Result Value Ref Range   Preg Test, Ur NEGATIVE NEGATIVE  Wet prep, genital     Status: Abnormal   Collection Time: 05/26/18  8:10 PM  Result Value Ref Range   Yeast Wet Prep HPF POC NONE SEEN NONE SEEN   Trich, Wet Prep NONE SEEN NONE SEEN   Clue Cells Wet Prep HPF POC NONE SEEN NONE SEEN   WBC, Wet Prep HPF POC FEW (A) NONE SEEN   Sperm NONE SEEN   CBC with Differential/Platelet     Status: None   Collection Time:  05/26/18  8:24 PM  Result Value Ref Range   WBC 5.5 4.0 - 10.5 K/uL   RBC 4.37 3.87 - 5.11 MIL/uL   Hemoglobin 12.5 12.0 - 15.0 g/dL   HCT 38.9 36.0 - 46.0 %   MCV 89.0 80.0 - 100.0 fL   MCH 28.6 26.0 - 34.0 pg   MCHC 32.1 30.0 - 36.0 g/dL   RDW 14.3 11.5 - 15.5 %   Platelets 331 150 - 400 K/uL   nRBC 0.0 0.0 - 0.2 %   Neutrophils Relative % 43 %   Neutro Abs 2.3 1.7 - 7.7 K/uL   Lymphocytes Relative 50 %   Lymphs Abs 2.8 0.7 - 4.0 K/uL   Monocytes Relative 5 %   Monocytes Absolute 0.3 0.1 - 1.0 K/uL   Eosinophils Relative 2 %   Eosinophils Absolute 0.1 0.0 -  0.5 K/uL   Basophils Relative 0 %   Basophils Absolute 0.0 0.0 - 0.1 K/uL  hCG, quantitative, pregnancy     Status: None   Collection Time: 05/26/18  8:24 PM  Result Value Ref Range   hCG, Beta Chain, Quant, S 1 <5 mIU/mL    Meds ordered this encounter  Medications  . megestrol (MEGACE) 40 MG tablet    Sig: Take 1 tablet (40 mg total) by mouth 2 (two) times daily for 14 days. Can increase to two tablets twice a day in the event of heavy bleeding    Dispense:  28 tablet    Refill:  0    Order Specific Question:   Supervising Provider    Answer:   Donnamae Jude [[id number]]     Assessment and Plan  --23 y.o. G1P1001, negative pregnancy test --Abnormal uterine bleeding --Discharge home in stable condition  F/U: Patient to schedule outpatient follow up   Darlina Rumpf, CNM 05/26/2018, 10:04 PM

## 2018-05-26 NOTE — Discharge Instructions (Signed)
Abnormal Uterine Bleeding  Abnormal uterine bleeding means bleeding more than usual from your uterus. It can include:   Bleeding between periods.   Bleeding after sex.   Bleeding that is heavier than normal.   Periods that last longer than usual.   Bleeding after you have stopped having your period (menopause).  There are many problems that may cause this. You should see a doctor for any kind of bleeding that is not normal. Treatment depends on the cause of the bleeding.  Follow these instructions at home:   Watch your condition for any changes.   Do not use tampons, douche, or have sex, if your doctor tells you not to.   Change your pads often.   Get regular well-woman exams. Make sure they include a pelvic exam and cervical cancer screening.   Keep all follow-up visits as told by your doctor. This is important.  Contact a doctor if:   The bleeding lasts more than one week.   You feel dizzy at times.   You feel like you are going to throw up (nauseous).   You throw up.  Get help right away if:   You pass out.   You have to change pads every hour.   You have belly (abdominal) pain.   You have a fever.   You get sweaty.   You get weak.   You passing large blood clots from your vagina.  Summary   Abnormal uterine bleeding means bleeding more than usual from your uterus.   There are many problems that may cause this. You should see a doctor for any kind of bleeding that is not normal.   Treatment depends on the cause of the bleeding.  This information is not intended to replace advice given to you by your health care provider. Make sure you discuss any questions you have with your health care provider.  Document Released: 01/21/2009 Document Revised: 03/20/2016 Document Reviewed: 03/20/2016  Elsevier Interactive Patient Education  2019 [employer]

## 2018-05-26 NOTE — MAU Note (Addendum)
Yesterday, when she stood up- blood started gushing down legs, clots in it.  Has calmed down since yesterday.  But is soaking a maxi pad every 1-2 hrs. Lots of cramping yesterday, not today. Doesn't know if was preg.was on for month, was here, put on meds to stop bleeding.  Had ? Very light period in Jan.  Was like this when she miscarried in the past.

## 2018-05-27 LAB — GC/CHLAMYDIA PROBE AMP (~~LOC~~) NOT AT ARMC
Chlamydia: NEGATIVE
Neisseria Gonorrhea: NEGATIVE

## 2018-06-05 ENCOUNTER — Ambulatory Visit (HOSPITAL_COMMUNITY)
Admission: EM | Admit: 2018-06-05 | Discharge: 2018-06-05 | Disposition: A | Discharge status: Home / Self Care | Payer: 59 | Attending: Emergency Medicine | Admitting: Emergency Medicine

## 2018-06-05 ENCOUNTER — Other Ambulatory Visit: Payer: Self-pay

## 2018-06-05 ENCOUNTER — Encounter (HOSPITAL_COMMUNITY): Payer: Self-pay | Admitting: Emergency Medicine

## 2018-06-05 DIAGNOSIS — J36 Peritonsillar abscess: Secondary | ICD-10-CM | POA: Diagnosis not present

## 2018-06-05 DIAGNOSIS — J029 Acute pharyngitis, unspecified: Secondary | ICD-10-CM | POA: Diagnosis not present

## 2018-06-05 MED ORDER — DEXAMETHASONE SODIUM PHOSPHATE 10 MG/ML IJ SOLN
10.0000 mg | Freq: Once | INTRAMUSCULAR | Status: AC
  Administered 2018-06-05: 10 mg via INTRAMUSCULAR

## 2018-06-05 MED ORDER — CLINDAMYCIN HCL 300 MG PO CAPS: 300.0000 mg | ORAL_CAPSULE | Freq: Three times a day (TID) | ORAL | 0 refills | Status: DC

## 2018-06-05 MED ORDER — NAPROXEN 500 MG PO TABS: 500.0000 mg | ORAL_TABLET | Freq: Two times a day (BID) | ORAL | 0 refills | Status: DC

## 2018-06-05 MED ORDER — DEXAMETHASONE SODIUM PHOSPHATE 10 MG/ML IJ SOLN
INTRAMUSCULAR | Status: AC
  Filled 2018-06-05: qty 1

## 2018-06-05 MED ORDER — PENICILLIN G BENZATHINE 1200000 UNIT/2ML IM SUSP
INTRAMUSCULAR | Status: AC
  Filled 2018-06-05: qty 2

## 2018-06-05 MED ORDER — PENICILLIN G BENZATHINE 1200000 UNIT/2ML IM SUSP
1.2000 10*6.[IU] | Freq: Once | INTRAMUSCULAR | Status: AC
  Administered 2018-06-05: 1.2 10*6.[IU] via INTRAMUSCULAR

## 2018-06-05 NOTE — Discharge Instructions (Addendum)
Continue warm gargles, Aleve twice a day.  May combine 1 g of Tylenol with this.  Finish the antibiotics, even if you feel better.  Follow-up with [facility]NT early next week.  Call tomorrow and make that appointment.

## 2018-06-05 NOTE — ED Triage Notes (Signed)
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 This flare up has been going on for 2 days. One tonsil is leaking. No difficulty breathing. PT requests antibiotics.

## 2018-06-05 NOTE — ED Provider Notes (Addendum)
HPI  SUBJECTIVE:  Leslie [name] is a 23 y.o. female who presents with right-sided sore throat erythema, swelling for the past 3 days.  She states that it is identical to previous right-sided peritonsillar abscesses, she states that it burst yesterday and has been draining.  She reports neck stiffness first day, but this has resolved.  She states that the swelling and pain are getting less.  She denies fevers, drooling, trismus, voice changes, difficulty breathing, station of her airway swelling shut.  She has been taking Aleve and doing garlic/lemon gargles with improvement in her symptoms.  Symptoms are worse when she eats acidic foods.  Last dose of Aleve was 30 minutes prior to arrival.  Is a past medical history recurrent PTA, no history of diabetes, hypertension.  She denies possibility being pregnant.  JIR:CVELF, Leslie Albee, PA-C ENT: [facility].  Patient has had 6 peritonsillar abscesses in 2019, last time was in October 2019, where she had an I&D by ENT at the bedside.  She was seen in the ER 1 month ago for a sore throat with no evidence of a PTA, was given dexamethasone and IV clindamycin and sent home with p.o. clindamycin, prednisone after discussion with ENT.  Past Medical History:  Diagnosis Date  . Medical history non-contributory   . Peritonsillar abscess     Past Surgical History:  Procedure Laterality Date  . CESAREAN SECTION  09/26/2013  . INCISION AND DRAINAGE OF PERITONSILLAR ABCESS      Family History  Problem Relation Age of Onset  . Hypertension Mother   . Diabetes Maternal Grandmother   . Diabetes Maternal Grandfather     Social History   Tobacco Use  . Smoking status: Never Smoker  . Smokeless tobacco: Never Used  Substance Use Topics  . Alcohol use: No  . Drug use: No    No current facility-administered medications for this encounter.   Current Outpatient Medications:  Marland Kitchen  Multiple Vitamins-Minerals (ZINC PO), Take 1 tablet by mouth daily., Disp:  , Rfl:  .  vitamin C (ASCORBIC ACID) 500 MG tablet, Take 500 mg by mouth daily., Disp: , Rfl:  .  clindamycin (CLEOCIN) 300 MG capsule, Take 1 capsule (300 mg total) by mouth 3 (three) times daily. X 10 days, Disp: 30 capsule, Rfl: 0 .  megestrol (MEGACE) 40 MG tablet, Take 1 tablet (40 mg total) by mouth 2 (two) times daily for 14 days. Can increase to two tablets twice a day in the event of heavy bleeding, Disp: 28 tablet, Rfl: 0 .  naproxen (NAPROSYN) 500 MG tablet, Take 1 tablet (500 mg total) by mouth 2 (two) times daily. X 7 days, Disp: 14 tablet, Rfl: 0  No Known Allergies   ROS  As noted in HPI.   Physical Exam  BP 132/72   Pulse 74   Temp 98.1 F (36.7 C) (Oral)   Resp 16   LMP 05/11/2018   SpO2 100%   Constitutional: Well developed, well nourished, no acute distress voice.  No drooling, trismus. Eyes:  EOMI, conjunctiva normal bilaterally HENT: Normocephalic, atraumatic, mucus membranes moist. erythematous oropharynx, swollen tonsils bilaterally.  Positive exudate left tonsil.  Uvula midline.  Positive tenderness on the soft palate of the right tonsil.  No expressible purulent drainage.  Patient states it drains posteriorly. Neck: No appreciable cervical lymphadenopathy. Respiratory: Normal inspiratory effort Cardiovascular: Normal rate, no murmurs GI: nondistended skin: No rash, skin intact Musculoskeletal: no deformities Neurologic: Alert & oriented x 3, no focal neuro  deficits Psychiatric: Speech and behavior appropriate   ED Course   Medications  dexamethasone (DECADRON) injection 10 mg (10 mg Intramuscular Given 06/05/18 2038)  penicillin g benzathine (BICILLIN LA) 1200000 UNIT/2ML injection 1.2 Million Units (1.2 Million Units Intramuscular Given 06/05/18 2038)    No orders of the defined types were placed in this encounter.   No results found for this or any previous visit (from the past 24 hour(s)). No results found.  ED Clinical  Impression  Peritonsillar abscess  Pharyngitis, unspecified etiology   ED Assessment/Plan  ER records reviewed.  As noted in HPI.  Discussed with Dr. Redmond Baseman, ENT on-call.  He agrees with the plan for Decadron, penicillin here, home with clindamycin and Aleve, as the abscess is draining on its own and seems to be improving.  She may follow-up early next week.  Again discussed with her the necessity of getting her tonsils removed.  Gave her strict ER return precautions.  Discussed  MDM, treatment plan, and plan for follow-up with patient. Discussed sn/sx that should prompt return to the ED. patient agrees with plan.   Meds ordered this encounter  Medications  . dexamethasone (DECADRON) injection 10 mg  . penicillin g benzathine (BICILLIN LA) 1200000 UNIT/2ML injection 1.2 Million Units    Order Specific Question:   Antibiotic Indication:    Answer:   Pharyngitis  . clindamycin (CLEOCIN) 300 MG capsule    Sig: Take 1 capsule (300 mg total) by mouth 3 (three) times daily. X 10 days    Dispense:  30 capsule    Refill:  0  . naproxen (NAPROSYN) 500 MG tablet    Sig: Take 1 tablet (500 mg total) by mouth 2 (two) times daily. X 7 days    Dispense:  14 tablet    Refill:  0    *This clinic note was created using [employer]. Therefore, there may be occasional mistakes despite careful proofreading.   ?   Melynda Ripple, MD 06/05/18 2037    Melynda Ripple, MD 06/05/18 2043

## 2018-11-13 ENCOUNTER — Encounter: Payer: Self-pay | Admitting: Obstetrics & Gynecology

## 2018-11-13 ENCOUNTER — Other Ambulatory Visit (HOSPITAL_COMMUNITY)
Admission: RE | Admit: 2018-11-13 | Discharge: 2018-11-13 | Disposition: A | Discharge status: Home / Self Care | Payer: 59 | Source: Ambulatory Visit | Attending: Obstetrics & Gynecology | Admitting: Obstetrics & Gynecology

## 2018-11-13 ENCOUNTER — Ambulatory Visit (INDEPENDENT_AMBULATORY_CARE_PROVIDER_SITE_OTHER): Payer: 59 | Admitting: Obstetrics & Gynecology

## 2018-11-13 ENCOUNTER — Ambulatory Visit: Payer: 59 | Admitting: Obstetrics & Gynecology

## 2018-11-13 ENCOUNTER — Other Ambulatory Visit: Payer: Self-pay

## 2018-11-13 VITALS — BP 109/67 | HR 90 | Ht 68.0 in | Wt 293.1 lb

## 2018-11-13 DIAGNOSIS — Z01419 Encounter for gynecological examination (general) (routine) without abnormal findings: Secondary | ICD-10-CM | POA: Diagnosis present

## 2018-11-13 DIAGNOSIS — Z3202 Encounter for pregnancy test, result negative: Secondary | ICD-10-CM

## 2018-11-13 DIAGNOSIS — N912 Amenorrhea, unspecified: Secondary | ICD-10-CM

## 2018-11-13 LAB — POCT URINE PREGNANCY: Preg Test, Ur: NEGATIVE

## 2018-11-13 NOTE — Progress Notes (Signed)
Subjective:     Leslie [name] is a 23 y.o. female here for a routine exam.  September 30, 2018. After the IUD was revmovee she reports normal montly cycles until June. Since that time she reports   Gynecologic History Patient's last menstrual period was 09/30/2018. Contraception: none Last Pap: 07/08/2017. Results were: ASCUS neg hrHPV  Last mammogram: n/a  Obstetric History OB History  Gravida Para Term Preterm AB Living  1 1 1     1   SAB TAB Ectopic Multiple Live Births          1    # Outcome Date GA Lbr Len/2nd Weight Sex Delivery Anes PTL Lv  1 Term 09/26/13     CS-[id number]   LIV    The following portions of the patient's history were reviewed and updated as appropriate: allergies, current medications, past family history, past medical history, past social history, past surgical history and problem list.  Review of Systems Pertinent items are noted in HPI.    Objective:  BP 109/67   Pulse 90   Ht 5\' 8"  (1.727 m)   Wt 293 lb 1.9 oz (133 kg)   LMP 09/30/2018   BMI 44.57 kg/m  General Appearance:    Alert, cooperative, no distress, appears stated age  Head:    Normocephalic, without obvious abnormality, atraumatic  Eyes:    conjunctiva/corneas clear, EOM's intact, both eyes  Ears:    Normal external ear canals, both ears  Nose:   Nares normal, septum midline, mucosa normal, no drainage    or sinus tenderness  Throat:   Lips, mucosa, and tongue normal; teeth and gums normal  Neck:   Supple, symmetrical, trachea midline, no adenopathy;    thyroid:  no enlargement/tenderness/nodules  Back:     Symmetric, no curvature, ROM normal, no CVA tenderness  Lungs:     respirations unlabored  Chest Wall:    No tenderness or deformity   Heart:    Regular rate and rhythm  Breast Exam:    No tenderness, masses, or nipple abnormality  Abdomen:     Soft, non-tender, bowel sounds active all four quadrants,    no masses, no organomegaly  Genitalia:    Normal female without lesion, discharge or  tenderness     Extremities:   Extremities normal, atraumatic, no cyanosis or edema  Pulses:   2+ and symmetric all extremities  Skin:   Skin color, texture, turgor normal, no rashes or lesions ;acanthosis nigricans     Assessment:    Healthy female exam.   Secondary amenorrhea with neg UPT. So far pt has one missed cycle.    Plan:    Follow up in: 1 year.   f/u sooner prn if cycles do not return next month monitor cycles Qual HCG today F/u PAP and cx PNL

## 2018-11-14 LAB — HEPATITIS C ANTIBODY: Hep C Virus Ab: <0.1 s/co ratio (ref 0.0–0.9)

## 2018-11-14 LAB — HIV ANTIBODY (ROUTINE TESTING W REFLEX): HIV Screen 4th Generation wRfx: NONREACTIVE

## 2018-11-14 LAB — HCG, SERUM, QUALITATIVE: hCG,Beta Subunit,Qual,Serum: NEGATIVE m[IU]/mL (ref <6)

## 2018-11-14 LAB — HEPATITIS B SURFACE ANTIGEN: Hepatitis B Surface Ag: NEGATIVE

## 2018-11-14 LAB — RPR: RPR Ser Ql: NONREACTIVE

## 2018-11-18 LAB — CYTOLOGY - PAP
Chlamydia: NEGATIVE
Diagnosis: NEGATIVE
Neisseria Gonorrhea: NEGATIVE

## 2018-12-18 ENCOUNTER — Other Ambulatory Visit: Payer: Self-pay

## 2018-12-18 ENCOUNTER — Ambulatory Visit (INDEPENDENT_AMBULATORY_CARE_PROVIDER_SITE_OTHER): Payer: 59 | Admitting: Obstetrics & Gynecology

## 2018-12-18 ENCOUNTER — Encounter: Payer: Self-pay | Admitting: Obstetrics & Gynecology

## 2018-12-18 VITALS — BP 131/67 | HR 91 | Ht 68.0 in | Wt 297.0 lb

## 2018-12-18 DIAGNOSIS — Z3201 Encounter for pregnancy test, result positive: Secondary | ICD-10-CM

## 2018-12-18 DIAGNOSIS — N912 Amenorrhea, unspecified: Secondary | ICD-10-CM | POA: Diagnosis not present

## 2018-12-18 LAB — POCT URINE PREGNANCY: Preg Test, Ur: NEGATIVE

## 2018-12-18 NOTE — Patient Instructions (Addendum)
Exercising to Lose Weight Exercise is structured, repetitive physical activity to improve fitness and health. Getting regular exercise is important for everyone. It is especially important if you are overweight. Being overweight increases your risk of heart disease, stroke, diabetes, high blood pressure, and several types of cancer. Reducing your calorie intake and exercising can help you lose weight. Exercise is usually categorized as moderate or vigorous intensity. To lose weight, most people need to do a certain amount of moderate-intensity or vigorous-intensity exercise each week. Moderate-intensity exercise  Moderate-intensity exercise is any activity that gets you moving enough to burn at least three times more energy (calories) than if you were sitting. Examples of moderate exercise include:  Walking a mile in 15 minutes.  Doing light yard work.  Biking at an easy pace. Most people should get at least 150 minutes (2 hours and 30 minutes) a week of moderate-intensity exercise to maintain their body weight. Vigorous-intensity exercise Vigorous-intensity exercise is any activity that gets you moving enough to burn at least six times more calories than if you were sitting. When you exercise at this intensity, you should be working hard enough that you are not able to carry on a conversation. Examples of vigorous exercise include:  Running.  Playing a team sport, such as football, basketball, and soccer.  Jumping rope. Most people should get at least 75 minutes (1 hour and 15 minutes) a week of vigorous-intensity exercise to maintain their body weight. How can exercise affect me? When you exercise enough to burn more calories than you eat, you lose weight. Exercise also reduces body fat and builds muscle. The more muscle you have, the more calories you burn. Exercise also:  Improves mood.  Reduces stress and tension.  Improves your overall fitness, flexibility, and endurance.   Increases bone strength. The amount of exercise you need to lose weight depends on:  Your age.  The type of exercise.  Any health conditions you have.  Your overall physical ability. Talk to your health care provider about how much exercise you need and what types of activities are safe for you. What actions can I take to lose weight? Nutrition   Make changes to your diet as told by your health care provider or diet and nutrition specialist (dietitian). This may include: ? Eating fewer calories. ? Eating more protein. ? Eating less unhealthy fats. ? Eating a diet that includes fresh fruits and vegetables, whole grains, low-fat dairy products, and lean protein. ? Avoiding foods with added fat, salt, and sugar.  Drink plenty of water while you exercise to prevent dehydration or heat stroke. Activity  Choose an activity that you enjoy and set realistic goals. Your health care provider can help you make an exercise plan that works for you.  Exercise at a moderate or vigorous intensity most days of the week. ? The intensity of exercise may vary from person to person. You can tell how intense a workout is for you by paying attention to your breathing and heartbeat. Most people will notice their breathing and heartbeat get faster with more intense exercise.  Do resistance training twice each week, such as: ? Push-ups. ? Sit-ups. ? Lifting weights. ? Using resistance bands.  Getting short amounts of exercise can be just as helpful as long structured periods of exercise. If you have trouble finding time to exercise, try to include exercise in your daily routine. ? Get up, stretch, and walk around every 30 minutes throughout the day. ? Go for a   walk during your lunch break. ? Park your car farther away from your destination. ? If you take public transportation, get off one stop early and walk the rest of the way. ? Make phone calls while standing up and walking around. ? Take the  stairs instead of elevators or escalators.  Wear comfortable clothes and shoes with good support.  Do not exercise so much that you hurt yourself, feel dizzy, or get very short of breath. Where to find more information  U.S. Department of Health and Human Services: [employer]at  Centers for Disease Control and Prevention (CDC): http://www.wolf.info/ Contact a health care provider:  Before starting a new exercise program.  If you have questions or concerns about your weight.  If you have a medical problem that keeps you from exercising. Get help right away if you have any of the following while exercising:  Injury.  Dizziness.  Difficulty breathing or shortness of breath that does not go away when you stop exercising.  Chest pain.  Rapid heartbeat. Summary  Being overweight increases your risk of heart disease, stroke, diabetes, high blood pressure, and several types of cancer.  Losing weight happens when you burn more calories than you eat.  Reducing the amount of calories you eat in addition to getting regular moderate or vigorous exercise each week helps you lose weight. This information is not intended to replace advice given to you by your health care provider. Make sure you discuss any questions you have with your health care provider. Document Released: 04/28/2010 Document Revised: 04/08/2017 Document Reviewed: 04/08/2017 Elsevier Patient Education  2020 [place] for Polycystic Ovary Syndrome Polycystic ovary syndrome (PCOS) is a disorder of the chemicals (hormones) that regulate a woman's reproductive system, including monthly periods (menstruation). The condition causes important hormones to be out of balance. PCOS can:  Stop your periods or make them irregular.  Cause cysts to develop on your ovaries.  Make it difficult to get pregnant.  Stop your body from responding to the effects of insulin (insulin resistance). Insulin resistance can lead to obesity and  diabetes. Changing what you eat can help you manage PCOS and improve your health. Following a balanced diet can help you lose weight and improve the way that your body uses insulin. What are tips for following this plan?  Follow a balanced diet for meals and snacks. Eat breakfast, lunch, dinner, and one or two snacks every day.  Include protein in each meal and snack.  Choose whole grains instead of products that are made with refined flour.  Eat a variety of foods.  Exercise regularly as told by your health care provider. Aim to do 30 or more minutes of exercise on most days of the week.  If you are overweight or obese: ? Pay attention to how many calories you eat. Cutting down on calories can help you lose weight. ? Work with your health care provider or a diet and nutrition specialist (dietitian) to figure out how many calories you need each day. What foods can I eat?  Fruits Include a variety of colors and types. All fruits are helpful for PCOS. Vegetables Include a variety of colors and types. All vegetables are helpful for PCOS. Grains Whole grains, such as whole wheat. Whole-grain breads, crackers, cereals, and pasta. Unsweetened oatmeal, bulgur, barley, quinoa, and brown rice. Tortillas made from corn or whole-wheat flour. Meats and other proteins Low-fat (lean) proteins, such as fish, chicken, beans, eggs, and tofu. Dairy Low-fat dairy products, such as  skim milk, cheese sticks, and yogurt. Beverages Low-fat or fat-free drinks, such as water, low-fat milk, sugar-free drinks, and small amounts of 100% fruit juice. Seasonings and condiments Ketchup. Mustard. Barbecue sauce. Relish. Low-fat or fat-free mayonnaise. Fats and oils Olive oil or canola oil. Walnuts and almonds. The items listed above may not be a complete list of recommended foods and beverages. Contact a dietitian for more options. What foods are not recommended? Foods that are high in calories or fat. Fried  foods. Sweets. Products that are made from refined white flour, including white bread, pastries, white rice, and pasta. The items listed above may not be a complete list of foods and beverages to avoid. Contact a dietitian for more information. Summary  PCOS is a hormonal imbalance that affects a woman's reproductive system.  You can help to manage your PCOS by exercising regularly and eating a healthy, varied diet of vegetables, fruit, whole grains, low-fat (lean) protein, and low-fat dairy products.  Changing what you eat can improve the way that your body uses insulin, help your hormones reach normal levels, and help you lose weight. This information is not intended to replace advice given to you by your health care provider. Make sure you discuss any questions you have with your health care provider. Document Released: 07/18/2015 Document Revised: 07/16/2018 Document Reviewed: 01/28/2017 Elsevier Patient Education  [place]. Polycystic Ovarian Syndrome  Polycystic ovarian syndrome (PCOS) is a common hormonal disorder among women of reproductive age. In most women with PCOS, many small fluid-filled sacs (cysts) grow on the ovaries, and the cysts are not part of a normal menstrual cycle. PCOS can cause problems with your menstrual periods and make it difficult to get pregnant. It can also cause an increased risk of miscarriage with pregnancy. If it is not treated, PCOS can lead to serious health problems, such as diabetes and heart disease. What are the causes? The cause of PCOS is not known, but it may be the result of a combination of certain factors, such as:  Irregular menstrual cycle.  High levels of certain hormones (androgens).  Problems with the hormone that helps to control blood sugar (insulin resistance).  Certain genes. What increases the risk? This condition is more likely to develop in women who have a family history of PCOS. What are the signs or symptoms?  Symptoms of PCOS may include:  Multiple ovarian cysts.  Infrequent periods or no periods.  Periods that are too frequent or too heavy.  Unpredictable periods.  Inability to get pregnant (infertility) because of not ovulating.  Increased growth of hair on the face, chest, stomach, back, thumbs, thighs, or toes.  Acne or oily skin. Acne may develop during adulthood, and it may not respond to treatment.  Pelvic pain.  Weight gain or obesity.  Patches of thickened and dark brown or black skin on the neck, arms, breasts, or thighs (acanthosis nigricans).  Excess hair growth on the face, chest, abdomen, or upper thighs (hirsutism). How is this diagnosed? This condition is diagnosed based on:  Your medical history.  A physical exam, including a pelvic exam. Your health care provider may look for areas of increased hair growth on your skin.  Tests, such as: ? Ultrasound. This may be used to examine the ovaries and the lining of the uterus (endometrium) for cysts. ? Blood tests. These may be used to check levels of sugar (glucose), female hormone (testosterone), and female hormones (estrogen and progesterone) in your blood. How is this treated?  There is no cure for PCOS, but treatment can help to manage symptoms and prevent more health problems from developing. Treatment varies depending on:  Your symptoms.  Whether you want to have a baby or whether you need birth control (contraception). Treatment may include nutrition and lifestyle changes along with:  Progesterone hormone to start a menstrual period.  Birth control pills to help you have regular menstrual periods.  Medicines to make you ovulate, if you want to get pregnant.  Medicine to reduce excessive hair growth.  Surgery, in severe cases. This may involve making small holes in one or both of your ovaries. This decreases the amount of testosterone that your body produces. Follow these instructions at home:  Take  over-the-counter and prescription medicines only as told by your health care provider.  Follow a healthy meal plan. This can help you reduce the effects of PCOS. ? Eat a healthy diet that includes lean proteins, complex carbohydrates, fresh fruits and vegetables, low-fat dairy products, and healthy fats. Make sure to eat enough fiber.  If you are overweight, lose weight as told by your health care provider. ? Losing 10% of your body weight may improve symptoms. ? Your health care provider can determine how much weight loss is best for you and can help you lose weight safely.  Keep all follow-up visits as told by your health care provider. This is important. Contact a health care provider if:  Your symptoms do not get better with medicine.  You develop new symptoms. This information is not intended to replace advice given to you by your health care provider. Make sure you discuss any questions you have with your health care provider. Document Released: 07/20/2004 Document Revised: 03/08/2017 Document Reviewed: 09/11/2015 Elsevier Patient Education  2020 [employer].

## 2018-12-18 NOTE — Progress Notes (Signed)
History:  23 y.o. G1P1001 here today for f/u of oligomenorrhea   The following portions of the patient's history were reviewed and updated as appropriate: allergies, current medications, past family history, past medical history, past social history, past surgical history and problem list.  Review of Systems:  Pertinent items are noted in HPI.    Objective:  Physical Exam Blood pressure 131/67, pulse 91, height 5\' 8"  (1.727 m), weight 297 lb (134.7 kg), last menstrual period 10/01/2018.  CONSTITUTIONAL: Well-developed, well-nourished female in no acute distress.  HENT:  Normocephalic, atraumatic EYES: Conjunctivae and EOM are normal. No scleral icterus.  NECK: Normal range of motion SKIN: Skin is warm and dry. No rash noted. Not diaphoretic.No pallor. Van: Alert and oriented to person, place, and time. Normal coordination.   Labs and Imaging UPT neg  Assessment & Plan:  Oligomenorrhea suspect due to PCOS             Reviewed diet and exercise                         My[employer]itness App                         Walking 6 days/ week             Review lab results from last visit                                   F/u in 3 months or sooner prn  Total face-to-face time with patient was 15 min.  Greater than 50% was spent in counseling and coordination of care with the patient.   Azari Janssens L. Harraway-Smith, M.D., Cherlynn June

## 2018-12-19 ENCOUNTER — Encounter: Payer: Self-pay | Admitting: Obstetrics & Gynecology

## 2019-01-25 ENCOUNTER — Inpatient Hospital Stay (HOSPITAL_COMMUNITY)
Admission: AD | Admit: 2019-01-25 | Discharge: 2019-01-25 | Disposition: A | Discharge status: Home / Self Care | Payer: 59 | Attending: Family Medicine | Admitting: Family Medicine

## 2019-01-25 ENCOUNTER — Inpatient Hospital Stay (HOSPITAL_COMMUNITY): Payer: 59

## 2019-01-25 ENCOUNTER — Encounter (HOSPITAL_COMMUNITY): Payer: Self-pay

## 2019-01-25 ENCOUNTER — Other Ambulatory Visit: Payer: Self-pay

## 2019-01-25 DIAGNOSIS — Z791 Long term (current) use of non-steroidal anti-inflammatories (NSAID): Secondary | ICD-10-CM | POA: Diagnosis not present

## 2019-01-25 DIAGNOSIS — O3481 Maternal care for other abnormalities of pelvic organs, first trimester: Secondary | ICD-10-CM | POA: Insufficient documentation

## 2019-01-25 DIAGNOSIS — Z3A01 Less than 8 weeks gestation of pregnancy: Secondary | ICD-10-CM | POA: Insufficient documentation

## 2019-01-25 DIAGNOSIS — N83201 Unspecified ovarian cyst, right side: Secondary | ICD-10-CM | POA: Diagnosis not present

## 2019-01-25 DIAGNOSIS — N83291 Other ovarian cyst, right side: Secondary | ICD-10-CM | POA: Insufficient documentation

## 2019-01-25 DIAGNOSIS — Z8249 Family history of ischemic heart disease and other diseases of the circulatory system: Secondary | ICD-10-CM | POA: Insufficient documentation

## 2019-01-25 DIAGNOSIS — O26891 Other specified pregnancy related conditions, first trimester: Secondary | ICD-10-CM | POA: Diagnosis not present

## 2019-01-25 DIAGNOSIS — R030 Elevated blood-pressure reading, without diagnosis of hypertension: Secondary | ICD-10-CM | POA: Insufficient documentation

## 2019-01-25 DIAGNOSIS — O3680X0 Pregnancy with inconclusive fetal viability, not applicable or unspecified: Secondary | ICD-10-CM

## 2019-01-25 DIAGNOSIS — O26851 Spotting complicating pregnancy, first trimester: Secondary | ICD-10-CM

## 2019-01-25 LAB — URINALYSIS, ROUTINE W REFLEX MICROSCOPIC
Bilirubin Urine: NEGATIVE
Glucose, UA: NEGATIVE mg/dL
Hgb urine dipstick: NEGATIVE
Ketones, ur: NEGATIVE mg/dL
Leukocytes,Ua: NEGATIVE
Nitrite: NEGATIVE
Protein, ur: NEGATIVE mg/dL
Specific Gravity, Urine: 1.027 (ref 1.005–1.030)
pH: 6 (ref 5.0–8.0)

## 2019-01-25 LAB — CBC
HCT: 38.1 % (ref 36.0–46.0)
Hemoglobin: 12.6 g/dL (ref 12.0–15.0)
MCH: 28.3 pg (ref 26.0–34.0)
MCHC: 33.1 g/dL (ref 30.0–36.0)
MCV: 85.6 fL (ref 80.0–100.0)
Platelets: 286 10*3/uL (ref 150–400)
RBC: 4.45 MIL/uL (ref 3.87–5.11)
RDW: 14.6 % (ref 11.5–15.5)
WBC: 7.5 10*3/uL (ref 4.0–10.5)
nRBC: 0 % (ref 0.0–0.2)

## 2019-01-25 LAB — POCT PREGNANCY, URINE: Preg Test, Ur: POSITIVE — AB

## 2019-01-25 LAB — HCG, QUANTITATIVE, PREGNANCY: hCG, Beta Chain, Quant, S: 34 m[IU]/mL — ABNORMAL HIGH (ref <5)

## 2019-01-25 MED ORDER — PREPLUS 27-1 MG PO TABS: 1.0000 | ORAL_TABLET | Freq: Every day | ORAL | 13 refills | Status: DC

## 2019-01-25 NOTE — Discharge Instructions (Signed)
Vaginal Bleeding During Pregnancy, First Trimester ° °A small amount of bleeding from the vagina (spotting) is relatively common during early pregnancy. It usually stops on its own. Various things may cause bleeding or spotting during early pregnancy. Some bleeding may be related to the pregnancy, and some may not. In many cases, the bleeding is normal and is not a problem. However, bleeding can also be a sign of something serious. Be sure to tell your health care provider about any vaginal bleeding right away. °Some possible causes of vaginal bleeding during the first trimester include: °· Infection or inflammation of the cervix. °· Growths (polyps) on the cervix. °· Miscarriage or threatened miscarriage. °· Pregnancy tissue developing outside of the uterus (ectopic pregnancy). °· A mass of tissue developing in the uterus due to an egg being fertilized incorrectly (molar pregnancy). °Follow these instructions at home: °Activity °· Follow instructions from your health care provider about limiting your activity. Ask what activities are safe for you. °· If needed, make plans for someone to help with your regular activities. °· Do not have sex or orgasms until your health care provider says that this is safe. °General instructions °· Take over-the-counter and prescription medicines only as told by your health care provider. °· Pay attention to any changes in your symptoms. °· Do not use tampons or douche. °· Write down how many pads you use each day, how often you change pads, and how soaked (saturated) they are. °· If you pass any tissue from your vagina, save the tissue so you can show it to your health care provider. °· Keep all follow-up visits as told by your health care provider. This is important. °Contact a health care provider if: °· You have vaginal bleeding during any part of your pregnancy. °· You have cramps or labor pains. °· You have a fever. °Get help right away if: °· You have severe cramps in your  back or abdomen. °· You pass large clots or a large amount of tissue from your vagina. °· Your bleeding increases. °· You feel light-headed or weak, or you faint. °· You have chills. °· You are leaking fluid or have a gush of fluid from your vagina. °Summary °· A small amount of bleeding (spotting) from the vagina is relatively common during early pregnancy. °· Various things may cause bleeding or spotting in early pregnancy. °· Be sure to tell your health care provider about any vaginal bleeding right away. °This information is not intended to replace advice given to you by your health care provider. Make sure you discuss any questions you have with your health care provider. °Document Released: 01/03/2005 Document Revised: 07/15/2018 Document Reviewed: 06/28/2016 °Elsevier Patient Education © 2020 [employer] ° °

## 2019-01-25 NOTE — MAU Note (Signed)
Leslie [name] is a 23 y.o. at Unknown here in MAU reporting: had a + UPT at home yesterday. Has been having some spotting for a couple of days and abdominal/pelvic aching for 2 weeks. Spotting is light pink.  LMP: has not had a period since June, neg UPT on 9/10  Onset of complaint: ongoing  Pain score: 7/10  Vitals:   01/25/19 1652  BP: (!) 183/93  Pulse: 99  Resp: 16  Temp: 98.5 F (36.9 C)  SpO2: 100%      Lab orders placed from triage: UPT, UA

## 2019-01-25 NOTE — MAU Provider Note (Signed)
History     CSN: [id number]  Arrival date and time: 01/25/19 1622   First Provider Initiated Contact with Patient 01/25/19 1712      Chief Complaint  Patient presents with  . Abdominal Pain  . Vaginal Bleeding   HPI Leslie [name] is a 23 y.o. G2P1001 in early pregnancy who presents to MAU with chief complaint of abdominal pain and vaginal spotting in the presence of positive home pregnancy tests. Her abdominal pain is recurrent, onset two weeks ago. It is suprapubic and radiates laterally towards the top of her pelvis. She rates her pain as 7/10. She denies aggravating or alleviating factors. She has not taken medication or tried other treatments for this complaint.  She endorses vaginal spotting, new onset two days ago. She denies heavy vaginal bleeding. She also denies dysuria, dyspareunia, flank pain, fever or recent illness.  Patient states she is starting a new job tomorrow and cannot accommodate clinic visits due to fear of losing her new job. She requests follow up in MAU.  OB History    Gravida  2   Para  1   Term  1   Preterm      AB      Living  1     SAB      TAB      Ectopic      Multiple      Live Births  1           Past Medical History:  Diagnosis Date  . Medical history non-contributory   . Peritonsillar abscess     Past Surgical History:  Procedure Laterality Date  . CESAREAN SECTION  09/26/2013  . INCISION AND DRAINAGE OF PERITONSILLAR ABCESS      Family History  Problem Relation Age of Onset  . Hypertension Mother   . Diabetes Maternal Grandmother   . Diabetes Maternal Grandfather     Social History   Tobacco Use  . Smoking status: Never Smoker  . Smokeless tobacco: Never Used  Substance Use Topics  . Alcohol use: No  . Drug use: No    Allergies: No Known Allergies  Medications Prior to Admission  Medication Sig Dispense Refill Last Dose  . clindamycin (CLEOCIN) 300 MG capsule Take 1 capsule (300 mg total) by mouth  3 (three) times daily. X 10 days (Patient not taking: Reported on 11/13/2018) 30 capsule 0   . Multiple Vitamins-Minerals (ZINC PO) Take 1 tablet by mouth daily.     . naproxen (NAPROSYN) 500 MG tablet Take 1 tablet (500 mg total) by mouth 2 (two) times daily. X 7 days (Patient not taking: Reported on 11/13/2018) 14 tablet 0   . vitamin C (ASCORBIC ACID) 500 MG tablet Take 500 mg by mouth daily.       Review of Systems  Constitutional: Negative for chills and fever.  Respiratory: Negative for shortness of breath.   Gastrointestinal: Positive for abdominal pain.  Genitourinary: Positive for vaginal bleeding. Negative for difficulty urinating, dysuria, flank pain and pelvic pain.  Musculoskeletal: Negative for back pain.  Neurological: Negative for dizziness and headaches.  All other systems reviewed and are negative.  Physical Exam   Blood pressure (!) 144/75, pulse 97, temperature 98.5 F (36.9 C), temperature source Oral, resp. rate 16, height 5\' 8"  (1.727 m), weight (!) 137.8 kg, last menstrual period 10/01/2018, SpO2 100 %.  Physical Exam  Nursing note and vitals reviewed. Constitutional: She is oriented to person, place, and time. She appears  well-developed and well-nourished.  Cardiovascular: Normal rate.  Respiratory: Effort normal.  GI: Soft. She exhibits no distension. There is no abdominal tenderness. There is no rebound, no guarding and no CVA tenderness.  Genitourinary:    Genitourinary Comments: Pelvic exam declined by patient   Neurological: She is alert and oriented to person, place, and time.  Skin: Skin is warm and dry.  Psychiatric: She has a normal mood and affect. Her behavior is normal. Judgment and thought content normal.    MAU Course/MDM  Procedures  Patient Vitals for the past 24 hrs:  BP Temp Temp src Pulse Resp SpO2 Height Weight  01/25/19 1915 (!) 152/89 - - 85 - - - -  01/25/19 1709 (!) 144/75 - - 97 - - - -  01/25/19 1652 (!) 183/93 98.5 F (36.9 C)  Oral 99 16 100 % - -  01/25/19 1648 - - - - - - 5\' 8"  (1.727 m) (!) 137.8 kg   Results for orders placed or performed during the hospital encounter of 01/25/19 (from the past 24 hour(s))  Pregnancy, urine POC     Status: Abnormal   Collection Time: 01/25/19  4:44 PM  Result Value Ref Range   Preg Test, Ur POSITIVE (A) NEGATIVE  Urinalysis, Routine w reflex microscopic     Status: None   Collection Time: 01/25/19  4:56 PM  Result Value Ref Range   Color, Urine YELLOW YELLOW   APPearance CLEAR CLEAR   Specific Gravity, Urine 1.027 1.005 - 1.030   pH 6.0 5.0 - 8.0   Glucose, UA NEGATIVE NEGATIVE mg/dL   Hgb urine dipstick NEGATIVE NEGATIVE   Bilirubin Urine NEGATIVE NEGATIVE   Ketones, ur NEGATIVE NEGATIVE mg/dL   Protein, ur NEGATIVE NEGATIVE mg/dL   Nitrite NEGATIVE NEGATIVE   Leukocytes,Ua NEGATIVE NEGATIVE  CBC     Status: None   Collection Time: 01/25/19  5:26 PM  Result Value Ref Range   WBC 7.5 4.0 - 10.5 K/uL   RBC 4.45 3.87 - 5.11 MIL/uL   Hemoglobin 12.6 12.0 - 15.0 g/dL   HCT 38.1 36.0 - 46.0 %   MCV 85.6 80.0 - 100.0 fL   MCH 28.3 26.0 - 34.0 pg   MCHC 33.1 30.0 - 36.0 g/dL   RDW 14.6 11.5 - 15.5 %   Platelets 286 150 - 400 K/uL   nRBC 0.0 0.0 - 0.2 %  hCG, quantitative, pregnancy     Status: Abnormal   Collection Time: 01/25/19  5:26 PM  Result Value Ref Range   hCG, Beta Chain, Quant, S 34 (H) <5 mIU/mL   US Ob Less Than 14 Weeks With Ob Transvaginal  Result Date: 01/25/2019 CLINICAL DATA:  Initial evaluation for vaginal spotting, early pregnancy. EXAM: OBSTETRIC <14 WK [place] AND TRANSVAGINAL OB US TECHNIQUE: Both transabdominal and transvaginal ultrasound examinations were performed for complete evaluation of the gestation as well as the maternal uterus, adnexal regions, and pelvic cul-de-sac. Transvaginal technique was performed to assess early pregnancy. COMPARISON:  None available. FINDINGS: Intrauterine gestational sac: Negative. Endometrial stripe measures  18.8 mm in thickness. Yolk sac:  Negative. Embryo:  Negative. Cardiac Activity: Negative. Heart Rate: N/A  bpm Subchorionic hemorrhage:  None visualized. Maternal uterus/adnexae: Left ovary normal in appearance. 1.9 x 2.9 x 1.8 cm lobulated in mildly complex cyst seen within the right ovary. Possible single thin internal septation. No associated vascularity. Trace free fluid seen within the pelvis. IMPRESSION: 1. Early pregnancy with no discrete IUP or concerning adnexal  mass identified. Finding is consistent with a pregnancy of unknown anatomic location. Differential considerations include IUP to early to visualize, recent SAB, or possibly occult ectopic pregnancy. Close clinical monitoring with serial beta HCGs and close interval follow-up ultrasound recommended as clinically warranted. 2. 2.9 cm mildly complex right ovarian cyst with associated trace free fluid within the pelvis. Findings indeterminate, with primary differential considerations including a mildly complex and collapsing physiologic follicular cyst versus hemorrhagic cyst. Attention at follow-up recommended. 3. No other acute maternal uterine or adnexal abnormality identified. Electronically Signed   By: Jeannine Boga M.D.   On: 01/25/2019 18:49   Assessment and Plan  --23 y.o. G2P1001 with pregnancy of unknown location --O POS --Elevated BP, no history of HTN --Discharge home in stable condition with ectopic/bleeding precautions  F/U: Patient to return to MAU Tuesday night for repeat Quant hCG (unable to be seen in clinic due to new job)  Darlina Rumpf, CNM 01/25/2019, 7:44 PM

## 2019-01-27 ENCOUNTER — Inpatient Hospital Stay (HOSPITAL_COMMUNITY)
Admission: AD | Admit: 2019-01-27 | Discharge: 2019-01-27 | Disposition: A | Discharge status: Home / Self Care | Payer: 59 | Attending: Family Medicine | Admitting: Family Medicine

## 2019-01-27 ENCOUNTER — Other Ambulatory Visit: Payer: Self-pay

## 2019-01-27 DIAGNOSIS — O3680X0 Pregnancy with inconclusive fetal viability, not applicable or unspecified: Secondary | ICD-10-CM

## 2019-01-27 DIAGNOSIS — Z3A00 Weeks of gestation of pregnancy not specified: Secondary | ICD-10-CM | POA: Diagnosis not present

## 2019-01-27 LAB — HCG, QUANTITATIVE, PREGNANCY: hCG, Beta Chain, Quant, S: 158 m[IU]/mL — ABNORMAL HIGH (ref <5)

## 2019-01-27 NOTE — MAU Note (Signed)
'  hungry". Denies pain or bleeding. Here for  Repeat HCG. Plan discussed.  Time explained

## 2019-01-27 NOTE — Discharge Instructions (Signed)
Return to care  If you have heavier bleeding that soaks through more that 2 pads per hour for an hour or more If you bleed so much that you feel like you might pass out or you do pass out If you have significant abdominal pain that is not improved with Tylenol   

## 2019-01-27 NOTE — MAU Provider Note (Signed)
Ms. Leslie [name]  is a 23 y.o. G2P1001  at Unknown who presents to MAU today for follow-up quant hCG. The patient denies abdominal pain, vaginal bleeding, N/V or fever.   BP (!) 145/63 (BP Location: Right Arm)   Pulse 90   Temp 98.5 F (36.9 C) (Oral)   Resp 17   LMP 10/01/2018   SpO2 99%   GENERAL: Well-developed, well-nourished female in no acute distress.  HEENT: Normocephalic, atraumatic.   LUNGS: Effort normal HEART: Regular rate  SKIN: Warm, dry and without erythema PSYCH: Normal mood and affect   A: Appropriate rise in quant hCG after 48 hours Component     Latest Ref Rng & Units 01/25/2019 01/27/2019  HCG, Beta Chain, Quant, S     <5 mIU/mL 34 (H) 158 (H)    P: Discharge home Bleeding/Ectopic precautions discussed Will get additional HCG at [facility] on Friday - if appropriate rise will consider outpatient viability scan Patient may return to MAU as needed or if her condition were to change or worsen   Jorje Guild, NP  01/27/2019 7:55 PM

## 2019-01-28 ENCOUNTER — Telehealth: Payer: Self-pay

## 2019-01-28 NOTE — Telephone Encounter (Signed)
Patient called to let us know she cant come Friday morning for her repeat HCG and can only come in the afternoon. I informed patient we would not be open on Friday afternoon and she should return to MAU for her repeat HCG. Will send to provider to make her aware of the situation. Kathrene Alu RN

## 2019-01-28 NOTE — Telephone Encounter (Signed)
The patient should go to [facility]. That's fine. Thanks.

## 2019-01-30 ENCOUNTER — Inpatient Hospital Stay (HOSPITAL_COMMUNITY)
Admission: AD | Admit: 2019-01-30 | Discharge: 2019-01-30 | Disposition: A | Discharge status: Home / Self Care | Payer: 59 | Attending: Obstetrics and Gynecology | Admitting: Obstetrics and Gynecology

## 2019-01-30 ENCOUNTER — Other Ambulatory Visit: Payer: Self-pay

## 2019-01-30 DIAGNOSIS — Z8249 Family history of ischemic heart disease and other diseases of the circulatory system: Secondary | ICD-10-CM | POA: Insufficient documentation

## 2019-01-30 DIAGNOSIS — Z3A00 Weeks of gestation of pregnancy not specified: Secondary | ICD-10-CM | POA: Insufficient documentation

## 2019-01-30 DIAGNOSIS — O3680X0 Pregnancy with inconclusive fetal viability, not applicable or unspecified: Secondary | ICD-10-CM | POA: Diagnosis not present

## 2019-01-30 LAB — HCG, QUANTITATIVE, PREGNANCY: hCG, Beta Chain, Quant, S: 797 m[IU]/mL — ABNORMAL HIGH (ref <5)

## 2019-01-30 NOTE — MAU Provider Note (Signed)
  History     CSN: [id number]  Arrival date and time: 01/30/19 1839   None     Chief Complaint  Patient presents with  . Labs Only   Ms Stonebreaker is a 23 yo G2P1001 who is presenting today for follow up bHCG quant. She was supposed to go to [place] today, but wasn't able to make her appointment.   OB History    Gravida  2   Para  1   Term  1   Preterm      AB      Living  1     SAB      TAB      Ectopic      Multiple      Live Births  1           Past Medical History:  Diagnosis Date  . Medical history non-contributory   . Peritonsillar abscess     Past Surgical History:  Procedure Laterality Date  . CESAREAN SECTION  09/26/2013  . INCISION AND DRAINAGE OF PERITONSILLAR ABCESS      Family History  Problem Relation Age of Onset  . Hypertension Mother   . Diabetes Maternal Grandmother   . Diabetes Maternal Grandfather     Social History   Tobacco Use  . Smoking status: Never Smoker  . Smokeless tobacco: Never Used  Substance Use Topics  . Alcohol use: No  . Drug use: No    Allergies: No Known Allergies  Medications Prior to Admission  Medication Sig Dispense Refill Last Dose  . Prenatal Vit-Fe Fumarate-FA (PREPLUS) 27-1 MG TABS Take 1 tablet by mouth daily. 30 tablet 13     Review of Systems  All other systems reviewed and are negative.  Physical Exam   Blood pressure 128/69, pulse 99, temperature 98.4 F (36.9 C), temperature source Oral, resp. rate 18, height 5\' 8"  (1.727 m), weight (!) 138 kg, last menstrual period 10/01/2018, SpO2 100 %.  Physical Exam  Nursing note and vitals reviewed. Constitutional: She appears well-developed and well-nourished.  HENT:  Head: Normocephalic and atraumatic.  Eyes: Pupils are equal, round, and reactive to light.  Neck: Normal range of motion.  Cardiovascular: Normal rate and regular rhythm.  Respiratory: Effort normal and breath sounds normal.  GI: Soft.    MAU Course   Procedures  MDM bHCG increased from 150 on 01/27/19 to 797 on 01/30/19  Assessment and Plan  23 yo G2P1001 here for repeat bHCG with appropriate rise. -[place] 10/18 did not show discrete IUP -follow up US ordered in 2 weeks -she will call the clinic on Monday to follow up and have her [place] scheduled at that time  [place] 01/30/2019, 11:07 PM

## 2019-01-30 NOTE — MAU Note (Signed)
Pt here for repeat hcg. Pt denies abdominal/pelvic pain or vaginal bleeding.

## 2019-02-17 ENCOUNTER — Ambulatory Visit (HOSPITAL_COMMUNITY)
Admission: RE | Admit: 2019-02-17 | Discharge: 2019-02-17 | Disposition: A | Discharge status: Home / Self Care | Payer: BC Managed Care – PPO | Source: Ambulatory Visit | Attending: Family Medicine | Admitting: Family Medicine

## 2019-02-17 ENCOUNTER — Other Ambulatory Visit: Payer: Self-pay

## 2019-02-17 ENCOUNTER — Other Ambulatory Visit (HOSPITAL_COMMUNITY): Payer: Self-pay | Admitting: Family Medicine

## 2019-02-17 ENCOUNTER — Ambulatory Visit (INDEPENDENT_AMBULATORY_CARE_PROVIDER_SITE_OTHER): Payer: BC Managed Care – PPO | Admitting: General Practice

## 2019-02-17 DIAGNOSIS — O3680X0 Pregnancy with inconclusive fetal viability, not applicable or unspecified: Secondary | ICD-10-CM | POA: Diagnosis not present

## 2019-02-17 DIAGNOSIS — Z712 Person consulting for explanation of examination or test findings: Secondary | ICD-10-CM

## 2019-02-17 NOTE — Progress Notes (Signed)
Patient seen and assessed by nursing staff during this encounter. I have reviewed the chart and agree with the documentation and plan.  Kerry Hough, PA-C 02/17/2019 9:27 AM

## 2019-02-17 NOTE — Progress Notes (Signed)
Patient presents to office today for viability ultrasound results. Reviewed results with Kerry Hough who finds single living IUP with small [facility], patient should begin prenatal care.  Informed patient of all results, discussed returning to MAU for any bleeding/severe pain, reviewed dating & provided pictures. Patient verbalized understanding to all & plans care in [facility]. Patient had no questions.  Koren Bound RN BSN 02/17/19

## 2019-03-16 ENCOUNTER — Encounter (HOSPITAL_COMMUNITY): Payer: Self-pay

## 2019-03-16 ENCOUNTER — Inpatient Hospital Stay (HOSPITAL_COMMUNITY)
Admission: AD | Admit: 2019-03-16 | Discharge: 2019-03-16 | Disposition: A | Discharge status: Home / Self Care | Payer: BC Managed Care – PPO | Attending: Obstetrics and Gynecology | Admitting: Obstetrics and Gynecology

## 2019-03-16 ENCOUNTER — Other Ambulatory Visit: Payer: Self-pay

## 2019-03-16 ENCOUNTER — Inpatient Hospital Stay (HOSPITAL_COMMUNITY): Payer: BC Managed Care – PPO

## 2019-03-16 DIAGNOSIS — O418X10 Other specified disorders of amniotic fluid and membranes, first trimester, not applicable or unspecified: Secondary | ICD-10-CM

## 2019-03-16 DIAGNOSIS — Z3A10 10 weeks gestation of pregnancy: Secondary | ICD-10-CM | POA: Diagnosis not present

## 2019-03-16 DIAGNOSIS — O26891 Other specified pregnancy related conditions, first trimester: Secondary | ICD-10-CM | POA: Insufficient documentation

## 2019-03-16 DIAGNOSIS — O26851 Spotting complicating pregnancy, first trimester: Secondary | ICD-10-CM | POA: Insufficient documentation

## 2019-03-16 DIAGNOSIS — Z3A11 11 weeks gestation of pregnancy: Secondary | ICD-10-CM | POA: Insufficient documentation

## 2019-03-16 DIAGNOSIS — N930 Postcoital and contact bleeding: Secondary | ICD-10-CM

## 2019-03-16 DIAGNOSIS — N898 Other specified noninflammatory disorders of vagina: Secondary | ICD-10-CM | POA: Diagnosis not present

## 2019-03-16 DIAGNOSIS — O468X1 Other antepartum hemorrhage, first trimester: Secondary | ICD-10-CM

## 2019-03-16 DIAGNOSIS — O219 Vomiting of pregnancy, unspecified: Secondary | ICD-10-CM | POA: Insufficient documentation

## 2019-03-16 LAB — URINALYSIS, ROUTINE W REFLEX MICROSCOPIC
Bilirubin Urine: NEGATIVE
Glucose, UA: NEGATIVE mg/dL
Hgb urine dipstick: NEGATIVE
Ketones, ur: NEGATIVE mg/dL
Nitrite: NEGATIVE
Protein, ur: NEGATIVE mg/dL
Specific Gravity, Urine: 1.014 (ref 1.005–1.030)
pH: 7 (ref 5.0–8.0)

## 2019-03-16 LAB — CBC
HCT: 40.5 % (ref 36.0–46.0)
Hemoglobin: 13.1 g/dL (ref 12.0–15.0)
MCH: 28.4 pg (ref 26.0–34.0)
MCHC: 32.3 g/dL (ref 30.0–36.0)
MCV: 87.9 fL (ref 80.0–100.0)
Platelets: 327 10*3/uL (ref 150–400)
RBC: 4.61 MIL/uL (ref 3.87–5.11)
RDW: 14.6 % (ref 11.5–15.5)
WBC: 8.2 10*3/uL (ref 4.0–10.5)
nRBC: 0 % (ref 0.0–0.2)

## 2019-03-16 LAB — HCG, QUANTITATIVE, PREGNANCY: hCG, Beta Chain, Quant, S: 110540 m[IU]/mL — ABNORMAL HIGH (ref <5)

## 2019-03-16 NOTE — MAU Note (Addendum)
PT is G2P1 at [redacted]w[redacted]d. She has been having nausea and vomiting for entire pregnancy. Has not taken anything for it. She is still having some pink spotting   Pt  has hx of subchorionic hemorrhage.She had intercourse 2x over past 2 days. Has clear vaginal discharge.

## 2019-03-16 NOTE — Discharge Instructions (Signed)

## 2019-03-16 NOTE — MAU Provider Note (Signed)
History     CSN: [id number]  Arrival date and time: 03/16/19 1548   None     Chief Complaint  Patient presents with  . Vaginal Bleeding  . Nausea   HPI Leslie [name] is a 23 y.o. G2P1001 at [redacted]w[redacted]d who presents to MAU with chief complaint of postcoital bleeding. She reports three episodes of intercourse in the past 24 hours. She noticed new onset spotting around lunchtime today. She denies pain. She also denies heavy vaginal bleeding, dysuria, fever or recent illness. She is planning to receive [facility] in [facility].  OB History    Gravida  2   Para  1   Term  1   Preterm      AB      Living  1     SAB      TAB      Ectopic      Multiple      Live Births  1           Past Medical History:  Diagnosis Date  . Medical history non-contributory   . Peritonsillar abscess     Past Surgical History:  Procedure Laterality Date  . CESAREAN SECTION  09/26/2013  . INCISION AND DRAINAGE OF PERITONSILLAR ABCESS      Family History  Problem Relation Age of Onset  . Hypertension Mother   . Diabetes Maternal Grandmother   . Diabetes Maternal Grandfather     Social History   Tobacco Use  . Smoking status: Never Smoker  . Smokeless tobacco: Never Used  Substance Use Topics  . Alcohol use: No  . Drug use: No    Allergies: No Known Allergies  Medications Prior to Admission  Medication Sig Dispense Refill Last Dose  . Prenatal Vit-Fe Fumarate-FA (PREPLUS) 27-1 MG TABS Take 1 tablet by mouth daily. 30 tablet 13 03/15/2019 at Unknown time    Review of Systems  Constitutional: Negative for chills, fatigue and fever.  Respiratory: Negative for shortness of breath.   Gastrointestinal: Negative for abdominal pain.  Genitourinary: Positive for vaginal bleeding. Negative for difficulty urinating, dysuria and flank pain.  Musculoskeletal: Negative for back pain.  All other systems reviewed and are negative.  Physical Exam   Blood pressure 133/72, pulse 86,  temperature 98.2 F (36.8 C), temperature source Oral, resp. rate 18, height 5\' 8"  (1.727 m), weight (!) 137.1 kg, last menstrual period 10/01/2018, SpO2 100 %.  Physical Exam  Nursing note and vitals reviewed. Constitutional: She is oriented to person, place, and time. She appears well-developed and well-nourished.  Cardiovascular: Normal rate.  Respiratory: Effort normal.  GI: Soft. She exhibits no distension. There is no abdominal tenderness. There is no rebound and no guarding.  Musculoskeletal: Normal range of motion.  Neurological: She is alert and oriented to person, place, and time.  Skin: Skin is warm and dry.  Psychiatric: She has a normal mood and affect. Her behavior is normal. Judgment and thought content normal.    MAU Course/MDM  Procedures  --No bleeding on pad worn to MAU. Patient declined vaginal swabs, pelvic exam --Small subchorionic hemorrhage visualized on viability scan from 02/17/19. Patient unaware of this diagnosis and also unaware of typical advice for pelvic rest.   Patient Vitals for the past 24 hrs:  BP Temp Temp src Pulse Resp SpO2 Height Weight  03/16/19 2023 (!) 104/47 98.4 F (36.9 C) Oral 83 18 - - -  03/16/19 1634 133/72 98.2 F (36.8 C) Oral 86 18 100 %  5\' 8"  (1.727 m) (!) 137.1 kg   Results for orders placed or performed during the hospital encounter of 03/16/19 (from the past 24 hour(s))  Urinalysis, Routine w reflex microscopic     Status: Abnormal   Collection Time: 03/16/19  4:53 PM  Result Value Ref Range   Color, Urine YELLOW YELLOW   APPearance CLEAR CLEAR   Specific Gravity, Urine 1.014 1.005 - 1.030   pH 7.0 5.0 - 8.0   Glucose, UA NEGATIVE NEGATIVE mg/dL   Hgb urine dipstick NEGATIVE NEGATIVE   Bilirubin Urine NEGATIVE NEGATIVE   Ketones, ur NEGATIVE NEGATIVE mg/dL   Protein, ur NEGATIVE NEGATIVE mg/dL   Nitrite NEGATIVE NEGATIVE   Leukocytes,Ua TRACE (A) NEGATIVE   RBC / HPF 0-5 0 - 5 RBC/hpf   WBC, UA 0-5 0 - 5 WBC/hpf    Bacteria, UA MANY (A) NONE SEEN   Squamous Epithelial / LPF 0-5 0 - 5   Mucus PRESENT   CBC     Status: None   Collection Time: 03/16/19  6:15 PM  Result Value Ref Range   WBC 8.2 4.0 - 10.5 K/uL   RBC 4.61 3.87 - 5.11 MIL/uL   Hemoglobin 13.1 12.0 - 15.0 g/dL   HCT 40.5 36.0 - 46.0 %   MCV 87.9 80.0 - 100.0 fL   MCH 28.4 26.0 - 34.0 pg   MCHC 32.3 30.0 - 36.0 g/dL   RDW 14.6 11.5 - 15.5 %   Platelets 327 150 - 400 K/uL   nRBC 0.0 0.0 - 0.2 %  hCG, quantitative, pregnancy     Status: Abnormal   Collection Time: 03/16/19  6:15 PM  Result Value Ref Range   hCG, Beta Chain, Quant, S 110,540 (H) <5 mIU/mL   US Ob Comp Less 14 Wks  Result Date: 03/16/2019 CLINICAL DATA:  Post coital bleeding. EXAM: OBSTETRIC <14 WK ULTRASOUND TECHNIQUE: Transabdominal ultrasound was performed for evaluation of the gestation as well as the maternal uterus and adnexal regions. COMPARISON:  February 17, 2019 FINDINGS: Intrauterine gestational sac: Single Yolk sac:  Not Visualized. Embryo:  Visualized. Cardiac Activity: Visualized. Heart Rate: 166 bpm CRL: 45 mm   11 w 2 d                  [place] EDC: 10/03/2019 Subchorionic hemorrhage: There is a small volume of subchorionic hemorrhage. Maternal uterus/adnexae: There is no significant maternal abnormality. There is no significant free fluid. IMPRESSION: Single live IUP at 11 weeks and 2 days as detailed above. Electronically Signed   By: Constance Holster M.D.   On: 03/16/2019 19:51   Assessment and Plan  --23 y.o. G2P1001 at [redacted]w[redacted]d  --Confirmed small subchorionic hemorrhage. Formally advised pelvic rest --Discharge home in stable condition with bleeding precautions   Darlina Rumpf, CNM 03/16/2019, 9:26 PM

## 2019-03-26 ENCOUNTER — Telehealth: Payer: BC Managed Care – PPO

## 2019-03-26 ENCOUNTER — Other Ambulatory Visit: Payer: Self-pay

## 2019-10-08 ENCOUNTER — Other Ambulatory Visit: Payer: Self-pay

## 2019-10-08 ENCOUNTER — Inpatient Hospital Stay (HOSPITAL_COMMUNITY): Payer: PRIVATE HEALTH INSURANCE | Admitting: Anesthesiology

## 2019-10-08 ENCOUNTER — Encounter (HOSPITAL_COMMUNITY): Payer: Self-pay | Admitting: Obstetrics & Gynecology

## 2019-10-08 ENCOUNTER — Inpatient Hospital Stay (HOSPITAL_COMMUNITY)
Admission: AD | Admit: 2019-10-08 | Discharge: 2019-10-11 | DRG: 787 | Disposition: A | Discharge status: Home / Self Care | Payer: PRIVATE HEALTH INSURANCE | Attending: Obstetrics and Gynecology | Admitting: Obstetrics and Gynecology

## 2019-10-08 DIAGNOSIS — Z8659 Personal history of other mental and behavioral disorders: Secondary | ICD-10-CM

## 2019-10-08 DIAGNOSIS — O99214 Obesity complicating childbirth: Secondary | ICD-10-CM | POA: Diagnosis present

## 2019-10-08 DIAGNOSIS — O34211 Maternal care for low transverse scar from previous cesarean delivery: Secondary | ICD-10-CM | POA: Diagnosis present

## 2019-10-08 DIAGNOSIS — Z3A40 40 weeks gestation of pregnancy: Secondary | ICD-10-CM

## 2019-10-08 DIAGNOSIS — O9081 Anemia of the puerperium: Secondary | ICD-10-CM | POA: Diagnosis not present

## 2019-10-08 DIAGNOSIS — O3663X0 Maternal care for excessive fetal growth, third trimester, not applicable or unspecified: Secondary | ICD-10-CM | POA: Diagnosis present

## 2019-10-08 DIAGNOSIS — O4700 False labor before 37 completed weeks of gestation, unspecified trimester: Secondary | ICD-10-CM | POA: Diagnosis present

## 2019-10-08 DIAGNOSIS — O99824 Streptococcus B carrier state complicating childbirth: Secondary | ICD-10-CM | POA: Diagnosis present

## 2019-10-08 DIAGNOSIS — Z6841 Body Mass Index (BMI) 40.0 and over, adult: Secondary | ICD-10-CM

## 2019-10-08 DIAGNOSIS — O9902 Anemia complicating childbirth: Secondary | ICD-10-CM | POA: Diagnosis present

## 2019-10-08 DIAGNOSIS — O139 Gestational [pregnancy-induced] hypertension without significant proteinuria, unspecified trimester: Secondary | ICD-10-CM | POA: Diagnosis not present

## 2019-10-08 DIAGNOSIS — Z20822 Contact with and (suspected) exposure to covid-19: Secondary | ICD-10-CM | POA: Diagnosis present

## 2019-10-08 DIAGNOSIS — D62 Acute posthemorrhagic anemia: Secondary | ICD-10-CM | POA: Diagnosis not present

## 2019-10-08 DIAGNOSIS — Z98891 History of uterine scar from previous surgery: Secondary | ICD-10-CM

## 2019-10-08 DIAGNOSIS — O26893 Other specified pregnancy related conditions, third trimester: Secondary | ICD-10-CM | POA: Diagnosis present

## 2019-10-08 LAB — CBC
HCT: 38.2 % (ref 36.0–46.0)
Hemoglobin: 12 g/dL (ref 12.0–15.0)
MCH: 28 pg (ref 26.0–34.0)
MCHC: 31.4 g/dL (ref 30.0–36.0)
MCV: 89.3 fL (ref 80.0–100.0)
Platelets: 260 10*3/uL (ref 150–400)
RBC: 4.28 MIL/uL (ref 3.87–5.11)
RDW: 14.6 % (ref 11.5–15.5)
WBC: 5.5 10*3/uL (ref 4.0–10.5)
nRBC: 0 % (ref 0.0–0.2)

## 2019-10-08 LAB — SARS CORONAVIRUS 2 BY RT PCR (HOSPITAL ORDER, PERFORMED IN ~~LOC~~ HOSPITAL LAB): SARS Coronavirus 2: NEGATIVE

## 2019-10-08 LAB — TYPE AND SCREEN
ABO/RH(D): O POS
Antibody Screen: NEGATIVE

## 2019-10-08 LAB — RPR: RPR Ser Ql: NONREACTIVE

## 2019-10-08 LAB — POCT FERN TEST: POCT Fern Test: POSITIVE

## 2019-10-08 LAB — ABO/RH: ABO/RH(D): O POS

## 2019-10-08 MED ORDER — DIPHENHYDRAMINE HCL 50 MG/ML IJ SOLN: 12.5000 mg | INTRAMUSCULAR | Status: DC | PRN

## 2019-10-08 MED ORDER — EPHEDRINE 5 MG/ML INJ: 10.0000 mg | INTRAVENOUS | Status: DC | PRN

## 2019-10-08 MED ORDER — OXYTOCIN 10 UNIT/ML IJ SOLN: 10.0000 [IU] | Freq: Once | INTRAMUSCULAR | Status: DC

## 2019-10-08 MED ORDER — PHENYLEPHRINE 40 MCG/ML (10ML) SYRINGE FOR IV PUSH (FOR BLOOD PRESSURE SUPPORT): 80.0000 ug | PREFILLED_SYRINGE | INTRAVENOUS | Status: DC | PRN

## 2019-10-08 MED ORDER — LACTATED RINGERS IV SOLN: 500.0000 mL | INTRAVENOUS | Status: DC | PRN

## 2019-10-08 MED ORDER — BUPIVACAINE HCL (PF) 0.25 % IJ SOLN
INTRAMUSCULAR | Status: DC | PRN
  Administered 2019-10-08 (×2): 5 mL via EPIDURAL

## 2019-10-08 MED ORDER — LIDOCAINE HCL (PF) 1 % IJ SOLN: 30.0000 mL | INTRAMUSCULAR | Status: DC | PRN

## 2019-10-08 MED ORDER — OXYTOCIN-SODIUM CHLORIDE 30-0.9 UT/500ML-% IV SOLN
2.5000 [IU]/h | INTRAVENOUS | Status: DC
  Filled 2019-10-08: qty 500

## 2019-10-08 MED ORDER — LACTATED RINGERS IV SOLN
500.0000 mL | Freq: Once | INTRAVENOUS | Status: AC
  Administered 2019-10-08: 500 mL via INTRAVENOUS

## 2019-10-08 MED ORDER — LIDOCAINE HCL (PF) 1 % IJ SOLN
INTRAMUSCULAR | Status: DC | PRN
  Administered 2019-10-08: 3 mL via EPIDURAL
  Administered 2019-10-08: 7 mL via EPIDURAL

## 2019-10-08 MED ORDER — FENTANYL-BUPIVACAINE-NACL 0.5-0.125-0.9 MG/250ML-% EP SOLN
12.0000 mL/h | EPIDURAL | Status: DC | PRN
  Filled 2019-10-08: qty 250

## 2019-10-08 MED ORDER — PHENYLEPHRINE 40 MCG/ML (10ML) SYRINGE FOR IV PUSH (FOR BLOOD PRESSURE SUPPORT)
80.0000 ug | PREFILLED_SYRINGE | INTRAVENOUS | Status: DC | PRN
  Filled 2019-10-08: qty 10

## 2019-10-08 MED ORDER — LACTATED RINGERS IV SOLN: INTRAVENOUS | Status: DC

## 2019-10-08 MED ORDER — TERBUTALINE SULFATE 1 MG/ML IJ SOLN: 0.2500 mg | Freq: Once | INTRAMUSCULAR | Status: DC | PRN

## 2019-10-08 MED ORDER — FENTANYL CITRATE (PF) 100 MCG/2ML IJ SOLN
INTRAMUSCULAR | Status: DC | PRN
  Administered 2019-10-08: 100 ug via EPIDURAL

## 2019-10-08 MED ORDER — OXYTOCIN BOLUS FROM INFUSION: 333.0000 mL | Freq: Once | INTRAVENOUS | Status: DC

## 2019-10-08 MED ORDER — SODIUM CHLORIDE 0.9 % IV SOLN: 250.0000 mL | INTRAVENOUS | Status: DC | PRN

## 2019-10-08 MED ORDER — SODIUM CHLORIDE (PF) 0.9 % IJ SOLN
INTRAMUSCULAR | Status: DC | PRN
  Administered 2019-10-08: 12 mL/h via EPIDURAL

## 2019-10-08 MED ORDER — FENTANYL CITRATE (PF) 100 MCG/2ML IJ SOLN
50.0000 ug | INTRAMUSCULAR | Status: DC | PRN
  Administered 2019-10-08 – 2019-10-09 (×3): 100 ug via INTRAVENOUS
  Filled 2019-10-08 (×5): qty 2

## 2019-10-08 MED ORDER — OXYTOCIN-SODIUM CHLORIDE 30-0.9 UT/500ML-% IV SOLN: 1.0000 m[IU]/min | INTRAVENOUS | Status: DC

## 2019-10-08 MED ORDER — ONDANSETRON HCL 4 MG/2ML IJ SOLN
4.0000 mg | Freq: Four times a day (QID) | INTRAMUSCULAR | Status: DC | PRN
  Administered 2019-10-08: 4 mg via INTRAVENOUS
  Filled 2019-10-08: qty 2

## 2019-10-08 MED ORDER — SODIUM CHLORIDE 0.9% FLUSH: 3.0000 mL | INTRAVENOUS | Status: DC | PRN

## 2019-10-08 MED ORDER — SOD CITRATE-CITRIC ACID 500-334 MG/5ML PO SOLN
30.0000 mL | ORAL | Status: DC | PRN
  Administered 2019-10-09: 30 mL via ORAL
  Filled 2019-10-08: qty 30

## 2019-10-08 MED ORDER — SODIUM CHLORIDE 0.9 % IV SOLN
5.0000 10*6.[IU] | Freq: Once | INTRAVENOUS | Status: AC
  Administered 2019-10-08: 5 10*6.[IU] via INTRAVENOUS
  Filled 2019-10-08: qty 5

## 2019-10-08 MED ORDER — PENICILLIN G POT IN DEXTROSE 60000 UNIT/ML IV SOLN
3.0000 10*6.[IU] | INTRAVENOUS | Status: DC
  Administered 2019-10-08 (×3): 3 10*6.[IU] via INTRAVENOUS
  Filled 2019-10-08 (×3): qty 50

## 2019-10-08 MED ORDER — ACETAMINOPHEN 500 MG PO TABS: 1000.0000 mg | ORAL_TABLET | Freq: Four times a day (QID) | ORAL | Status: DC | PRN

## 2019-10-08 MED ORDER — SODIUM CHLORIDE 0.9% FLUSH: 3.0000 mL | Freq: Two times a day (BID) | INTRAVENOUS | Status: DC

## 2019-10-08 NOTE — MAU Note (Signed)
Pt reports SROM at 0230. NO ctxs. Desires TOLAC

## 2019-10-08 NOTE — Progress Notes (Signed)
Subjective:    Requested removal of patent IV due to discomfort at sight. Has had failed multiple attempts for IV start. Discussed need for IV b/c of prev uterine surgery and GBS prophylaxis. Pt agrees. Pain management options  Discussed, pt desires an unmedicated labor and birth. Pt does not appear to be tolerating labor, does not respond well to relaxation and breathing techniques. Doula at bedside and offers some support.   Objective:    VS: BP 115/66   Pulse 80   Temp 97.7 F (36.5 C)   Resp 16   Ht 5\' 8"  (1.727 m)   Wt (!) 147 kg   LMP 10/01/2018   SpO2 100%   BMI 49.26 kg/m  FHR : baseline 125 / variability moderate / accelerations present / absent decelerations Toco: contractions every 3-6 minutes  Membranes: SROM since 0230, remains clear Dilation: 4 Effacement (%): 80 Station: -2 Presentation: Vertex Exam by:: Burman Foster  Assessment/Plan:   24 y.o. G2P1001 [redacted]w[redacted]d SROM w/o labor Prev C/S desires TOLAC    -Reviewed Op Note, transverse incision on uterus  Labor: No cervical change, refuses Pitocin , discussed prolonged latent phase w/ ROM, continues to refuse Pitocin Preeclampsia:  no signs or symptoms of toxicity Fetal Wellbeing:  Category I Pain Control:  declines pain med options I/D:  GBS positive, PCN prophylaxis Anticipated MOD:  NSVD  Arrie Eastern MSN, CNM 10/08/2019 5:39 PM

## 2019-10-08 NOTE — H&P (Signed)
OB ADMISSION/ HISTORY & PHYSICAL:  Admission Date: 10/08/2019  6:07 AM  Admit Diagnosis: SROM w/o labor  Leslie [name] is a 24 y.o. female G2P1001 [redacted]w[redacted]d presenting for LOF since 0230. Endorses active FM, denies LOF and vaginal bleeding. Desires TOLAC after primary C/S for FTP. Aware of R/B and consents for TOLAC signed. Pt counseled on multiple occasions RE: TOLAC and IOL w/ possible LGA fetus. Pt declined to sched IOL. Has a birth plan that limits interventions and desires an unmedicated labor and birth. Spouse and doula providing support. Hx of depression and anxiety, and postpartum depression w/o meds.   History of current pregnancy: G2P1001   Patient transferred from [facility] and entered care with [facility] at 22+4wks.   EDC of 10/08/19 was established by [place] @ 6+5 wks  Anatomy scan:   with normal findings and anterior placenta.   Antenatal testing: for BMI started at 32 weeks Last evaluation:  at 38 weeks EFW 9+1, 96%ile.  Significant prenatal events:  Patient Active Problem List   Diagnosis Date Noted  . BMI 45.0-49.9, adult ([place]) 10/09/2019  . Hx of postpartum depression, currently pregnant 10/09/2019  . Normal labor 10/08/2019  . Premature uterine contractions 10/08/2019  . Pure hypercholesterolemia 06/12/2016    Prenatal Labs: ABO, Rh: --/--/O POS, O POS Performed at [facility], [place] [address] [place], [place] [address]  [phone]/01 0700) Antibody: NEG (07/01 0700) Rubella:   immune RPR: Non Reactive (08/06 1401)  HBsAg: Negative (08/06 1401)  HIV: Non Reactive (08/06 1401)  GTT: passed 3 hour w/ one elevated value GBS:   positive GC/CHL: neg/neg Genetics: declined Sickle cell/Hgb electrophoresis: declined   OB History  Gravida Para Term Preterm AB Living  2 1 1     1   SAB TAB Ectopic Multiple Live Births          1    # Outcome Date GA Lbr Len/2nd Weight Sex Delivery Anes PTL Lv  2 Current           1 Term 09/26/13     CS-LTranv   LIV    Medical /  Surgical History: Past medical history:  Past Medical History:  Diagnosis Date  . Medical history non-contributory   . Peritonsillar abscess     Past surgical history:  Past Surgical History:  Procedure Laterality Date  . CESAREAN SECTION  09/26/2013  . INCISION AND DRAINAGE OF PERITONSILLAR ABCESS     Family History:  Family History  Problem Relation Age of Onset  . Hypertension Mother   . Diabetes Maternal Grandmother   . Diabetes Maternal Grandfather     Social History:  reports that she has never smoked. She has never used smokeless tobacco. She reports that she does not drink alcohol and does not use drugs.  Allergies: Patient has no known allergies.   Current Medications at time of admission:  Prior to Admission medications   Medication Sig Start Date End Date Taking? Authorizing Provider  Prenatal Vit-Fe Fumarate-FA (MULTIVITAMIN-PRENATAL) 27-0.8 MG TABS tablet Take 1 tablet by mouth daily at 12 noon.   Yes [provider]    Review of Systems: Constitutional: Negative   HENT: Negative   Eyes: Negative   Respiratory: Negative   Cardiovascular: Negative   Gastrointestinal: Negative  Genitourinary: neg for bloody show, neg for LOF   Musculoskeletal: Negative   Skin: Negative   Neurological: Negative   Endo/Heme/Allergies: Negative   Psychiatric/Behavioral: Negative    Physical Exam: VS: Blood  pressure 121/66, pulse (!) 105, temperature 98.4 F (36.9 C), temperature source Oral, height 5\' 8"  (1.727 m), weight (!) 147 kg, last menstrual period 10/01/2018. AAO x3, no signs of distress Cardiovascular: RRR Respiratory: Lung fields clear to ausculation GU/GI: Abdomen gravid, non-tender, non-distended, active FM, vertex Extremities: trace edema, negative for pain, tenderness, and cords  Cervical exam:   2.5/50/-3 soft FHR: baseline rate 130 / variability moderate / accelerations present / absent decelerations TOCO: rare   Prenatal Transfer Tool   Maternal Diabetes: No Genetic Screening: Declined Maternal Ultrasounds/Referrals: Normal Fetal Ultrasounds or other Referrals:  None Maternal Substance Abuse:  No Significant Maternal Medications:  None Significant Maternal Lab Results: Group B Strep positive    Assessment: 24 y.o. G2P1001 [redacted]w[redacted]d SROM w/o labor Prev LTCS for FTP Possible LGA fetus 98%ile  Latent stage of labor FHR category 1 GBS positive Pain management plan: plans unmedicated. Epidural w/ prev labor   Plan:  Admit to L&D Routine admission orders Nipple stimulation Epidural PRN PCN for GBS prophylaxis Dr Charlesetta Garibaldi notified of admission and plan of care  Arrie Eastern MSN, CNM 10/08/2019 7:52 AM

## 2019-10-08 NOTE — Anesthesia Procedure Notes (Signed)
Epidural Patient location during procedure: OB  Staffing Anesthesiologist: Lidia Collum, MD Performed: anesthesiologist   Preanesthetic Checklist Completed: patient identified, IV checked, risks and benefits discussed, monitors and equipment checked, pre-op evaluation and timeout performed  Epidural Patient position: sitting Prep: DuraPrep Patient monitoring: heart rate, continuous pulse ox and blood pressure Approach: midline Location: L3-L4 Injection technique: LOR air  Needle:  Needle type: Tuohy  Needle gauge: 17 G Needle length: 15 cm Needle insertion depth: 10 cm Catheter type: closed end flexible Catheter size: 19 Gauge Catheter at skin depth: 15 cm Test dose: negative  Assessment Events: blood aspirated, injection not painful, no injection resistance, no paresthesia and negative IV test  Additional Notes Initial attempt with 9 cm Tuohy and loss with needle hubbed and some additional compression. Catheter threaded easily but with +heme aspiration. Catheter removed and re-attempted with 15 cm Tuohy. Loss at 10 cm. Catheter threaded easily. No aspirate from catheter on second placement. Reason for block:procedure for pain

## 2019-10-08 NOTE — Anesthesia Preprocedure Evaluation (Addendum)
Anesthesia Evaluation  Patient identified by MRN, date of birth, ID band Patient awake    Reviewed: Allergy & Precautions, H&P , NPO status , Patient's Chart, lab work & pertinent test results  History of Anesthesia Complications Negative for: history of anesthetic complications  Airway Mallampati: II  TM Distance: >3 FB Neck ROM: full    Dental no notable dental hx.    Pulmonary neg pulmonary ROS,    Pulmonary exam normal        Cardiovascular negative cardio ROS Normal cardiovascular exam Rhythm:regular Rate:Normal     Neuro/Psych negative neurological ROS  negative psych ROS   GI/Hepatic negative GI ROS, Neg liver ROS,   Endo/Other  Morbid obesity  Renal/GU      Musculoskeletal   Abdominal   Peds  Hematology negative hematology ROS (+)   Anesthesia Other Findings   Reproductive/Obstetrics (+) Pregnancy                             Anesthesia Physical Anesthesia Plan  ASA: III and emergent  Anesthesia Plan: Spinal   Post-op Pain Management:    Induction:   PONV Risk Score and Plan: 3 and Ondansetron and Treatment may vary due to age or medical condition  Airway Management Planned: Natural Airway  Additional Equipment: None  Intra-op Plan:   Post-operative Plan:   Informed Consent: I have reviewed the patients History and Physical, chart, labs and discussed the procedure including the risks, benefits and alternatives for the proposed anesthesia with the patient or authorized representative who has indicated his/her understanding and acceptance.       Plan Discussed with:   Anesthesia Plan Comments: ( To OR for C/S due to failure to progress. Had labor epidural but has not provided satisfactory pain relief, and patient declined epidural replacement. Plan for spinal for surgical anesthesia.)       Anesthesia Quick Evaluation

## 2019-10-08 NOTE — Progress Notes (Signed)
Subjective:    Admitted for SROM, w/o labor, clear fluid, Feeling comfortable sitting on birthing ball. Discussed extensive birth and pt requests. Prev C/S @ [facility] for [employer]. Desires minimal interventions to augment labor. Agrees to nipple stim now x3 hours then Pitocin augmentation if no cervical change.    Objective:    VS: BP (!) 149/79   Pulse (!) 102   Temp 98.6 F (37 C)   Resp 16   Ht 5\' 8"  (1.727 m)   Wt (!) 147 kg   LMP 10/01/2018   SpO2 100%   BMI 49.26 kg/m  FHR : baseline 125 / variability moderate / accelerations present / absent decelerations Toco: contractions none in 20 min Membranes: SROM since 0230 clear Dilation: 2.5 Effacement (%): 50 Station: -3 Presentation: Vertex Exam by:: Burman Foster CNM  Assessment/Plan:   24 y.o. G2P1001 [redacted]w[redacted]d TOLAC, primary C/S 6 yrs ago for FTP  Labor: Nipple stim now, will augment w/ Pitocin in 3 hours if no cervical change Preeclampsia:  no signs or symptoms of toxicity and labs stable Fetal Wellbeing:  Category I Pain Control:  plans natural labor and birth I/D:  GBS positive, PCN prophylaxis x1 Anticipated MOD:  NSVD  Leslie Eastern MSN, CNM 10/08/2019 11:47 AM

## 2019-10-08 NOTE — MAU Note (Signed)
covid swab obtained without difficulty and pt tol well. No symptoms °

## 2019-10-08 NOTE — Progress Notes (Signed)
Subjective:    No cervical change, agrees to Pitocin after epidural. Will begin continuous monitoring.   Objective:    VS: BP 116/65   Pulse 98   Temp (!) 97 F (36.1 C) (Axillary)   Resp 17   Ht 5\' 8"  (1.727 m)   Wt (!) 147 kg   LMP 10/01/2018   SpO2 100%   BMI 49.26 kg/m  FHR : baseline 125 / variability moderate / accelerations present / absent decelerations Toco: contractions every 1-4  Membranes: SROM since 0230, clear Dilation: 4 Effacement (%): 80 Station: -2 Presentation: Vertex Exam by:: C Cornetto RN  Assessment/Plan:   24 y.o. G2P1001 [redacted]w[redacted]d SROM Prev C/S desires TOLAC  Labor: no cervical change, agrees to Pitocin after epidural placement Fetal Wellbeing:  Category I Pain Control:  planning epidural, anesthesia notified by RN I/D:  GBS positive, adequately treated w/ PCN Anticipated MOD:  NSVD  Arrie Eastern MSN, CNM 10/08/2019 8:52 PM

## 2019-10-09 ENCOUNTER — Encounter (HOSPITAL_COMMUNITY): Payer: Self-pay | Admitting: Obstetrics and Gynecology

## 2019-10-09 ENCOUNTER — Encounter (HOSPITAL_COMMUNITY)
Admission: AD | Disposition: A | Discharge status: Home / Self Care | Payer: Self-pay | Source: Home / Self Care | Attending: Obstetrics and Gynecology

## 2019-10-09 DIAGNOSIS — Z98891 History of uterine scar from previous surgery: Secondary | ICD-10-CM

## 2019-10-09 DIAGNOSIS — Z8659 Personal history of other mental and behavioral disorders: Secondary | ICD-10-CM

## 2019-10-09 DIAGNOSIS — Z6841 Body Mass Index (BMI) 40.0 and over, adult: Secondary | ICD-10-CM

## 2019-10-09 DIAGNOSIS — O139 Gestational [pregnancy-induced] hypertension without significant proteinuria, unspecified trimester: Secondary | ICD-10-CM | POA: Diagnosis not present

## 2019-10-09 LAB — COMPREHENSIVE METABOLIC PANEL
ALT: 52 U/L — ABNORMAL HIGH (ref 0–44)
AST: 42 U/L — ABNORMAL HIGH (ref 15–41)
Albumin: 2.3 g/dL — ABNORMAL LOW (ref 3.5–5.0)
Alkaline Phosphatase: 163 U/L — ABNORMAL HIGH (ref 38–126)
Anion gap: 8 (ref 5–15)
BUN: 6 mg/dL (ref 6–20)
CO2: 22 mmol/L (ref 22–32)
Calcium: 9 mg/dL (ref 8.9–10.3)
Chloride: 104 mmol/L (ref 98–111)
Creatinine, Ser: 0.62 mg/dL (ref 0.44–1.00)
GFR calc Af Amer: >60 mL/min (ref >60)
GFR calc non Af Amer: >60 mL/min (ref >60)
Glucose, Bld: 116 mg/dL — ABNORMAL HIGH (ref 70–99)
Potassium: 4.3 mmol/L (ref 3.5–5.1)
Sodium: 134 mmol/L — ABNORMAL LOW (ref 135–145)
Total Bilirubin: 1.2 mg/dL (ref 0.3–1.2)
Total Protein: 5.7 g/dL — ABNORMAL LOW (ref 6.5–8.1)

## 2019-10-09 LAB — URIC ACID: Uric Acid, Serum: 6.6 mg/dL (ref 2.5–7.1)

## 2019-10-09 LAB — CBC
HCT: 35 % — ABNORMAL LOW (ref 36.0–46.0)
Hemoglobin: 11.1 g/dL — ABNORMAL LOW (ref 12.0–15.0)
MCH: 28.2 pg (ref 26.0–34.0)
MCHC: 31.7 g/dL (ref 30.0–36.0)
MCV: 88.8 fL (ref 80.0–100.0)
Platelets: 252 10*3/uL (ref 150–400)
RBC: 3.94 MIL/uL (ref 3.87–5.11)
RDW: 14.4 % (ref 11.5–15.5)
WBC: 8.7 10*3/uL (ref 4.0–10.5)
nRBC: 0 % (ref 0.0–0.2)

## 2019-10-09 LAB — LACTATE DEHYDROGENASE: LDH: 155 U/L (ref 98–192)

## 2019-10-09 LAB — PROTEIN / CREATININE RATIO, URINE
Creatinine, Urine: 188.48 mg/dL
Protein Creatinine Ratio: 0.12 mg/mg{Cre} (ref 0.00–0.15)
Total Protein, Urine: 22 mg/dL

## 2019-10-09 SURGERY — Surgical Case
Anesthesia: Epidural | Wound class: Clean Contaminated

## 2019-10-09 MED ORDER — IBUPROFEN 800 MG PO TABS
800.0000 mg | ORAL_TABLET | Freq: Four times a day (QID) | ORAL | Status: DC
  Administered 2019-10-10 – 2019-10-11 (×6): 800 mg via ORAL
  Filled 2019-10-09 (×6): qty 1

## 2019-10-09 MED ORDER — TRANEXAMIC ACID-NACL 1000-0.7 MG/100ML-% IV SOLN
1000.0000 mg | INTRAVENOUS | Status: AC
  Administered 2019-10-09: 1000 mg via INTRAVENOUS

## 2019-10-09 MED ORDER — DIPHENHYDRAMINE HCL 25 MG PO CAPS: 25.0000 mg | ORAL_CAPSULE | ORAL | Status: DC | PRN

## 2019-10-09 MED ORDER — NALOXONE HCL 4 MG/10ML IJ SOLN
1.0000 ug/kg/h | INTRAVENOUS | Status: DC | PRN
  Filled 2019-10-09: qty 5

## 2019-10-09 MED ORDER — KETOROLAC TROMETHAMINE 30 MG/ML IJ SOLN
30.0000 mg | Freq: Four times a day (QID) | INTRAMUSCULAR | Status: AC
  Administered 2019-10-09 – 2019-10-10 (×2): 30 mg via INTRAVENOUS
  Filled 2019-10-09 (×2): qty 1

## 2019-10-09 MED ORDER — PRENATAL MULTIVITAMIN CH
1.0000 | ORAL_TABLET | Freq: Every day | ORAL | Status: DC
  Administered 2019-10-10 – 2019-10-11 (×2): 1 via ORAL
  Filled 2019-10-09 (×2): qty 1

## 2019-10-09 MED ORDER — CHLOROPROCAINE HCL (PF) 3 % IJ SOLN
INTRAMUSCULAR | Status: DC | PRN
  Administered 2019-10-09: 20 mL

## 2019-10-09 MED ORDER — FENTANYL CITRATE (PF) 100 MCG/2ML IJ SOLN
INTRAMUSCULAR | Status: DC | PRN
  Administered 2019-10-09: 15 ug via INTRATHECAL

## 2019-10-09 MED ORDER — OXYCODONE HCL 5 MG PO TABS: 5.0000 mg | ORAL_TABLET | Freq: Once | ORAL | Status: DC | PRN

## 2019-10-09 MED ORDER — CHLOROPROCAINE HCL (PF) 3 % IJ SOLN
INTRAMUSCULAR | Status: AC
  Filled 2019-10-09: qty 20

## 2019-10-09 MED ORDER — OXYTOCIN-SODIUM CHLORIDE 30-0.9 UT/500ML-% IV SOLN
INTRAVENOUS | Status: DC | PRN
  Administered 2019-10-09: 300 mL via INTRAVENOUS

## 2019-10-09 MED ORDER — OXYCODONE HCL 5 MG/5ML PO SOLN: 5.0000 mg | Freq: Once | ORAL | Status: DC | PRN

## 2019-10-09 MED ORDER — ONDANSETRON HCL 4 MG/2ML IJ SOLN
INTRAMUSCULAR | Status: AC
  Filled 2019-10-09: qty 2

## 2019-10-09 MED ORDER — PHENYLEPHRINE HCL-NACL 20-0.9 MG/250ML-% IV SOLN
INTRAVENOUS | Status: AC
  Filled 2019-10-09: qty 250

## 2019-10-09 MED ORDER — SIMETHICONE 80 MG PO CHEW
80.0000 mg | CHEWABLE_TABLET | ORAL | Status: DC
  Administered 2019-10-10 (×2): 80 mg via ORAL
  Filled 2019-10-09 (×2): qty 1

## 2019-10-09 MED ORDER — COCONUT OIL OIL: 1.0000 "application " | TOPICAL_OIL | Status: DC | PRN

## 2019-10-09 MED ORDER — DEXTROSE 5 % IV SOLN
INTRAVENOUS | Status: AC
  Filled 2019-10-09: qty 3000

## 2019-10-09 MED ORDER — SODIUM CHLORIDE 0.9 % IR SOLN
Status: DC | PRN
  Administered 2019-10-09: 1

## 2019-10-09 MED ORDER — NALBUPHINE HCL 10 MG/ML IJ SOLN: 5.0000 mg | INTRAMUSCULAR | Status: DC | PRN

## 2019-10-09 MED ORDER — MORPHINE SULFATE (PF) 0.5 MG/ML IJ SOLN
INTRAMUSCULAR | Status: AC
  Filled 2019-10-09: qty 10

## 2019-10-09 MED ORDER — NALOXONE HCL 0.4 MG/ML IJ SOLN: 0.4000 mg | INTRAMUSCULAR | Status: DC | PRN

## 2019-10-09 MED ORDER — TRANEXAMIC ACID-NACL 1000-0.7 MG/100ML-% IV SOLN
INTRAVENOUS | Status: AC
  Filled 2019-10-09: qty 100

## 2019-10-09 MED ORDER — TETANUS-DIPHTH-ACELL PERTUSSIS 5-2.5-18.5 LF-MCG/0.5 IM SUSP: 0.5000 mL | Freq: Once | INTRAMUSCULAR | Status: DC

## 2019-10-09 MED ORDER — BUPIVACAINE IN DEXTROSE 0.75-8.25 % IT SOLN
INTRATHECAL | Status: DC | PRN
  Administered 2019-10-09: 1.6 mL via INTRATHECAL

## 2019-10-09 MED ORDER — SENNOSIDES-DOCUSATE SODIUM 8.6-50 MG PO TABS
2.0000 | ORAL_TABLET | ORAL | Status: DC
  Administered 2019-10-10 (×2): 2 via ORAL
  Filled 2019-10-09 (×2): qty 2

## 2019-10-09 MED ORDER — METHYLERGONOVINE MALEATE 0.2 MG/ML IJ SOLN
0.2000 mg | Freq: Once | INTRAMUSCULAR | Status: AC
  Administered 2019-10-09: 0.2 mg via INTRAMUSCULAR

## 2019-10-09 MED ORDER — WITCH HAZEL-GLYCERIN EX PADS: 1.0000 "application " | MEDICATED_PAD | CUTANEOUS | Status: DC | PRN

## 2019-10-09 MED ORDER — FENTANYL CITRATE (PF) 100 MCG/2ML IJ SOLN: 25.0000 ug | INTRAMUSCULAR | Status: DC | PRN

## 2019-10-09 MED ORDER — PHENYLEPHRINE HCL-NACL 20-0.9 MG/250ML-% IV SOLN
INTRAVENOUS | Status: DC | PRN
  Administered 2019-10-09: 60 ug/min via INTRAVENOUS

## 2019-10-09 MED ORDER — MORPHINE SULFATE (PF) 0.5 MG/ML IJ SOLN
INTRAMUSCULAR | Status: DC | PRN
  Administered 2019-10-09: .15 mg via INTRATHECAL

## 2019-10-09 MED ORDER — NALBUPHINE HCL 10 MG/ML IJ SOLN: 5.0000 mg | Freq: Once | INTRAMUSCULAR | Status: DC | PRN

## 2019-10-09 MED ORDER — OXYCODONE HCL 5 MG PO TABS
5.0000 mg | ORAL_TABLET | ORAL | Status: DC | PRN
  Administered 2019-10-10: 5 mg via ORAL
  Administered 2019-10-11: 10 mg via ORAL
  Filled 2019-10-09: qty 2
  Filled 2019-10-09: qty 1

## 2019-10-09 MED ORDER — DIPHENHYDRAMINE HCL 25 MG PO CAPS: 25.0000 mg | ORAL_CAPSULE | Freq: Four times a day (QID) | ORAL | Status: DC | PRN

## 2019-10-09 MED ORDER — DEXTROSE 5 % IV SOLN
INTRAVENOUS | Status: DC | PRN
  Administered 2019-10-09: 3 g via INTRAVENOUS

## 2019-10-09 MED ORDER — DIPHENHYDRAMINE HCL 50 MG/ML IJ SOLN: 12.5000 mg | INTRAMUSCULAR | Status: DC | PRN

## 2019-10-09 MED ORDER — METHYLERGONOVINE MALEATE 0.2 MG/ML IJ SOLN
INTRAMUSCULAR | Status: AC
  Filled 2019-10-09: qty 1

## 2019-10-09 MED ORDER — ONDANSETRON HCL 4 MG/2ML IJ SOLN
4.0000 mg | Freq: Three times a day (TID) | INTRAMUSCULAR | Status: DC | PRN
  Administered 2019-10-09: 4 mg via INTRAVENOUS
  Filled 2019-10-09: qty 2

## 2019-10-09 MED ORDER — FENTANYL CITRATE (PF) 100 MCG/2ML IJ SOLN
INTRAMUSCULAR | Status: AC
  Filled 2019-10-09: qty 2

## 2019-10-09 MED ORDER — SIMETHICONE 80 MG PO CHEW
80.0000 mg | CHEWABLE_TABLET | Freq: Three times a day (TID) | ORAL | Status: DC
  Administered 2019-10-09 – 2019-10-11 (×5): 80 mg via ORAL
  Filled 2019-10-09 (×5): qty 1

## 2019-10-09 MED ORDER — LACTATED RINGERS IV SOLN: INTRAVENOUS | Status: DC | PRN

## 2019-10-09 MED ORDER — ONDANSETRON HCL 4 MG/2ML IJ SOLN
INTRAMUSCULAR | Status: DC | PRN
  Administered 2019-10-09: 4 mg via INTRAVENOUS

## 2019-10-09 MED ORDER — MENTHOL 3 MG MT LOZG: 1.0000 | LOZENGE | OROMUCOSAL | Status: DC | PRN

## 2019-10-09 MED ORDER — ENOXAPARIN SODIUM 80 MG/0.8ML ~~LOC~~ SOLN
80.0000 mg | SUBCUTANEOUS | Status: DC
  Administered 2019-10-09 – 2019-10-10 (×2): 80 mg via SUBCUTANEOUS
  Filled 2019-10-09 (×2): qty 0.8

## 2019-10-09 MED ORDER — ACETAMINOPHEN 500 MG PO TABS
1000.0000 mg | ORAL_TABLET | Freq: Four times a day (QID) | ORAL | Status: DC
  Administered 2019-10-10 – 2019-10-11 (×6): 1000 mg via ORAL
  Filled 2019-10-09 (×7): qty 2

## 2019-10-09 MED ORDER — SCOPOLAMINE 1 MG/3DAYS TD PT72: 1.0000 | MEDICATED_PATCH | Freq: Once | TRANSDERMAL | Status: DC

## 2019-10-09 MED ORDER — ZOLPIDEM TARTRATE 5 MG PO TABS: 5.0000 mg | ORAL_TABLET | Freq: Every evening | ORAL | Status: DC | PRN

## 2019-10-09 MED ORDER — ONDANSETRON HCL 4 MG/2ML IJ SOLN: 4.0000 mg | Freq: Once | INTRAMUSCULAR | Status: DC | PRN

## 2019-10-09 MED ORDER — LACTATED RINGERS IV SOLN: INTRAVENOUS | Status: DC

## 2019-10-09 MED ORDER — SODIUM CHLORIDE 0.9% FLUSH: 3.0000 mL | INTRAVENOUS | Status: DC | PRN

## 2019-10-09 MED ORDER — DIBUCAINE (PERIANAL) 1 % EX OINT: 1.0000 "application " | TOPICAL_OINTMENT | CUTANEOUS | Status: DC | PRN

## 2019-10-09 MED ORDER — KETOROLAC TROMETHAMINE 30 MG/ML IJ SOLN: 30.0000 mg | Freq: Once | INTRAMUSCULAR | Status: DC | PRN

## 2019-10-09 MED ORDER — OXYTOCIN-SODIUM CHLORIDE 30-0.9 UT/500ML-% IV SOLN
INTRAVENOUS | Status: AC
  Filled 2019-10-09: qty 500

## 2019-10-09 MED ORDER — SIMETHICONE 80 MG PO CHEW: 80.0000 mg | CHEWABLE_TABLET | ORAL | Status: DC | PRN

## 2019-10-09 MED ORDER — OXYTOCIN-SODIUM CHLORIDE 30-0.9 UT/500ML-% IV SOLN: 2.5000 [IU]/h | INTRAVENOUS | Status: AC

## 2019-10-09 SURGICAL SUPPLY — 36 items
BENZOIN TINCTURE PRP APPL 2/3 (GAUZE/BANDAGES/DRESSINGS) ×2 IMPLANT
CHLORAPREP W/TINT 26ML (MISCELLANEOUS) ×2 IMPLANT
CLAMP CORD UMBIL (MISCELLANEOUS) IMPLANT
CLOTH BEACON ORANGE TIMEOUT ST (SAFETY) ×2 IMPLANT
DRSG OPSITE POSTOP 4X10 (GAUZE/BANDAGES/DRESSINGS) ×2 IMPLANT
ELECT REM PT RETURN 9FT ADLT (ELECTROSURGICAL) ×2
ELECTRODE REM PT RTRN 9FT ADLT (ELECTROSURGICAL) ×1 IMPLANT
EXTRACTOR VACUUM M CUP 4 TUBE (SUCTIONS) IMPLANT
GLOVE BIO SURGEON STRL SZ7.5 (GLOVE) ×2 IMPLANT
GLOVE BIOGEL PI IND STRL 7.0 (GLOVE) ×1 IMPLANT
GLOVE BIOGEL PI IND STRL 7.5 (GLOVE) ×1 IMPLANT
GLOVE BIOGEL PI INDICATOR 7.0 (GLOVE) ×1
GLOVE BIOGEL PI INDICATOR 7.5 (GLOVE) ×1
GOWN STRL REUS W/TWL LRG LVL3 (GOWN DISPOSABLE) ×4 IMPLANT
HOVERMATT SINGLE USE (MISCELLANEOUS) ×2 IMPLANT
KIT ABG SYR 3ML LUER SLIP (SYRINGE) IMPLANT
NEEDLE HYPO 25X5/8 SAFETYGLIDE (NEEDLE) IMPLANT
NS IRRIG 1000ML POUR BTL (IV SOLUTION) ×2 IMPLANT
PACK C SECTION WH (CUSTOM PROCEDURE TRAY) ×2 IMPLANT
PAD OB MATERNITY 4.3X12.25 (PERSONAL CARE ITEMS) ×2 IMPLANT
PENCIL SMOKE EVAC W/HOLSTER (ELECTROSURGICAL) ×2 IMPLANT
RETRACTOR TRAXI PANNICULUS (MISCELLANEOUS) ×1 IMPLANT
RTRCTR C-SECT PINK 25CM LRG (MISCELLANEOUS) ×2 IMPLANT
STRIP CLOSURE SKIN 1/2X4 (GAUZE/BANDAGES/DRESSINGS) ×2 IMPLANT
SUT CHROMIC 2 0 CT 1 (SUTURE) ×2 IMPLANT
SUT MNCRL AB 3-0 PS2 27 (SUTURE) ×2 IMPLANT
SUT PLAIN 2 0 XLH (SUTURE) ×2 IMPLANT
SUT VIC AB 0 CT1 36 (SUTURE) ×2 IMPLANT
SUT VIC AB 0 CTX 36 (SUTURE) ×3
SUT VIC AB 0 CTX36XBRD ANBCTRL (SUTURE) ×3 IMPLANT
SUT VIC AB 2-0 SH 27 (SUTURE) ×2
SUT VIC AB 2-0 SH 27XBRD (SUTURE) ×2 IMPLANT
TOWEL OR 17X24 6PK STRL BLUE (TOWEL DISPOSABLE) ×2 IMPLANT
TRAXI PANNICULUS RETRACTOR (MISCELLANEOUS) ×1
TRAY FOLEY W/BAG SLVR 14FR LF (SET/KITS/TRAYS/PACK) ×2 IMPLANT
WATER STERILE IRR 1000ML POUR (IV SOLUTION) ×2 IMPLANT

## 2019-10-09 NOTE — Progress Notes (Signed)
Unable to give report and transport patient at 25 - spoke with [facility] charge RN, to bring patient at this time and give report on the floor -[email or web address]

## 2019-10-09 NOTE — Progress Notes (Signed)
PIV consult: Arrived to room, pt trying to sleep, requested to "wait until later" to place IV. Message to RN.

## 2019-10-09 NOTE — Progress Notes (Signed)
I spoke with Leslie [name], concerning her request to have 2 rings cut off her 2nd and 3rd fingers of her right hand.  Rings move around fingers but cannot be slipped off.  No complaint of pain, fingers are warm to touch and normal in color.  I offered to cut rings and Leslie [name] stated she has decided to wait and see if swelling will decrease enough to take her rings off without cutting them.  I told Leslie [name] that I could return with the ring cutter if rings become uncomfortable and she changes her mind.

## 2019-10-09 NOTE — Anesthesia Procedure Notes (Signed)
Spinal  Patient location during procedure: OR Staffing Performed: anesthesiologist  Anesthesiologist: Lidia Collum, MD Preanesthetic Checklist Completed: patient identified, IV checked, risks and benefits discussed, surgical consent, monitors and equipment checked, pre-op evaluation and timeout performed Spinal Block Patient position: sitting Prep: DuraPrep and site prepped and draped Patient monitoring: continuous pulse ox, blood pressure and heart rate Approach: midline Location: L3-4 Injection technique: single-shot Needle Needle type: Pencan  Needle gauge: 24 G Needle length: 15 cm Additional Notes Functioning IV was confirmed and monitors were applied. Sterile prep and drape, including hand hygiene and sterile gloves were used. The patient was positioned and the spine was prepped. The skin was anesthetized with lidocaine.  Free flow of clear CSF was obtained prior to injecting local anesthetic into the CSF. The needle was carefully withdrawn. The patient tolerated the procedure well.

## 2019-10-09 NOTE — Progress Notes (Signed)
Right forearm and hand swollen, IV cath partially out of vein and tape lifted. Patient complaining of discomfort. IV cath  removed immediately. Warm compresses, warm blanket applied and arm elevated on pillow. Fingers swollen, patient states rings feel tight. Vaseline applied to finger to remove ring. Patient doesn't want to attempt to remove her ring. Upon arrival to room from OR/PACU rings were on fingers with tape because the rings were unable to removed prior to surgery. Hand and fingers with rings are warm to touch and rings rotates on her finger. Patient requesting Lactation Consultant to help her with breastfeeding and LC present.

## 2019-10-09 NOTE — Progress Notes (Signed)
Called by Burman Foster, CNM at [phone] d/t patient requesting c-section now.  Pt did not get any relief after epidural and does not want another attempt and just wants to proceed with c-section.  Cat 1 tracing.  I reviewed with patient the risks benefits and alternatives of a c-section including but not limited to bleeding infection and injury, pt, FOB nor doula had questions and consent signed and witnessed.  Awaiting completion of c-section that was ongoing and then will proceed with repeat c-section.

## 2019-10-09 NOTE — Progress Notes (Signed)
Swelling decreasing, compressible tissue in forearm and hand. Continuing to apply heat and elevation. Rings rotate on finger and fingers warm and pink.Patient has discussed wanting her rings cut off. Called house coverage who is trying to locate ring cutter. Patient informed and will consider if she wants rings removed.

## 2019-10-09 NOTE — Transfer of Care (Signed)
Immediate Anesthesia Transfer of Care Note  Patient: Leslie [name]  Procedure(s) Performed: CESAREAN SECTION (N/A )  Patient Location: PACU  Anesthesia Type:Spinal  Level of Consciousness: awake, alert  and patient cooperative  Airway & Oxygen Therapy: Patient Spontanous Breathing  Post-op Assessment: Report given to RN and Post -op Vital signs reviewed and stable  Post vital signs: Reviewed and stable  Last Vitals:  Vitals Value Taken Time  BP 127/84 10/09/19 0523  Temp    Pulse 79 10/09/19 0528  Resp 15 10/09/19 0528  SpO2 100 % 10/09/19 0528  Vitals shown include unvalidated device data.  Last Pain:  Vitals:   10/09/19 0208  TempSrc: Axillary  PainSc:          Complications: No complications documented.

## 2019-10-09 NOTE — Lactation Note (Signed)
This note was copied from a baby's chart. Lactation Consultation Note  Patient Name: Leslie [name] [id number] Date: 10/09/2019 Reason for consult: Follow-up assessment   Baby 20 hours old and LC was called for assistance w/ latching. Mother still nauseous unable to reposition. Infant cueing.  Attempted in various positions but due to positioning baby did not sustain latch. Mother has good flow of colostrum.  Spoon fed infant approx. 5 ml. Noted short mid anterior lingual frenulum.  Suggest discussing w/ Ped MD.  Reviewed how to spoon feed with grandmother and FOB.    Maternal Data Has patient been taught Hand Expression?: Yes Does the patient have breastfeeding experience prior to this delivery?: No  Feeding Feeding Type: Breast Fed  LATCH Score Latch: Grasps breast easily, tongue down, lips flanged, rhythmical sucking.  Audible Swallowing: A few with stimulation  Type of Nipple: Everted at rest and after stimulation  Comfort (Breast/Nipple): Soft / non-tender  Hold (Positioning): Full assist, staff holds infant at breast  LATCH Score: 7  Interventions Interventions: Assisted with latch;Skin to skin;Hand express;Adjust position;Support pillows  Lactation Tools Discussed/Used     Consult Status Consult Status: Follow-up Date: 10/10/19 Follow-up type: In-patient    Vivianne Master [facility] 10/09/2019, 3:44 PM

## 2019-10-09 NOTE — Anesthesia Postprocedure Evaluation (Signed)
Anesthesia Post Note  Patient: Leslie [name]  Procedure(s) Performed: CESAREAN SECTION (N/A )     Patient location during evaluation: PACU Anesthesia Type: Spinal Level of consciousness: oriented and awake and alert Pain management: pain level controlled Vital Signs Assessment: post-procedure vital signs reviewed and stable Respiratory status: spontaneous breathing, respiratory function stable and nonlabored ventilation Cardiovascular status: blood pressure returned to baseline and stable Postop Assessment: no headache, no backache, no apparent nausea or vomiting and spinal receding Anesthetic complications: no   No complications documented.  Last Vitals:  Vitals:   10/09/19 0630 10/09/19 0645  BP: 131/81   Pulse: 89 87  Resp: (!) 23 (!) 24  Temp:    SpO2: 97% 93%    Last Pain:  Vitals:   10/09/19 0615  TempSrc:   PainSc: 0-No pain   Pain Goal:    LLE Motor Response: Purposeful movement (10/09/19 0645) LLE Sensation: Full sensation (10/09/19 0645) RLE Motor Response: Purposeful movement (10/09/19 0645) RLE Sensation: Full sensation (10/09/19 0645)     Epidural/Spinal Function Cutaneous sensation: Able to Wiggle Toes (10/09/19 0645), Patient able to flex knees: Yes (10/09/19 0645), Patient able to lift hips off bed: Yes (10/09/19 0645), Back pain beyond tenderness at insertion site: No (10/09/19 0645), Progressively worsening motor and/or sensory loss: No (10/09/19 0645), Bowel and/or bladder incontinence post epidural: No (10/09/19 0645)  Lidia Collum

## 2019-10-09 NOTE — Op Note (Addendum)
Cesarean Section Procedure Note  Indications: P1 at 19 1/7wks who decided she would like to proceed with repeat c-section d/t inadequate pain relief with epidural. (Just before proceeding to c-section, I noticed that Gillian Scarce, pt's mother, is listed as pt's legal guardian.  The patient said she is 24yo and doesn't know why that designation is there.) FOB and Doula are the only support people present in the room.  Pre-operative Diagnosis: 1.40 1/7wks 2.Repeat Cesarean Section   Post-operative Diagnosis: 1.40 1/7wks 2.Repeat Cesarean Section  Procedure: Low Transverse Repeat C-Section  Surgeon: Everett Graff, MD    Assistants: Cornell Barman, scrub tech  Anesthesia: Spinal  Anesthesiologist: Lidia Collum, MD   Procedure Details  The patient was taken to the operating room secondary to h/o c-section desiring repeat after the risks, benefits, complications, treatment options, and expected outcomes were discussed with the patient.  The patient concurred with the proposed plan, giving informed consent which was signed and witnessed. The patient was taken to Operating Room C, identified as Sheela Stack and the procedure verified as C-Section Delivery. A Time Out was held and the above information confirmed.  After induction of anesthesia by obtaining a spinal, the patient was prepped and draped in the usual sterile manner. A Pfannenstiel skin incision was made and carried down through the subcutaneous tissue to the underlying layer of fascia.  The fascia was incised bilaterally and extended transversely bilaterally with the Mayo scissors. Kocher clamps were placed on the inferior aspect of the fascial incision and the underlying rectus muscle was separated from the fascia. The same was done on the superior aspect of the fascial incision.  The peritoneum was identified, entered bluntly and extended manually.  An Alexis self-retaining retractor was placed.  The utero-vesical peritoneal  reflection was incised transversely and the bladder flap was bluntly freed from the lower uterine segment. A low transverse uterine incision was made with the scalpel and extended bilaterally with the bandage scissors.  The infant was ROT and delivered vertex without difficulty.  After the umbilical cord was clamped and cut, the infant was handed to the awaiting pediatricians.  Cord blood was obtained for evaluation.  The placenta was removed intact and appeared to be within normal limits. The uterus was cleared of all clots and debris. The uterine incision was closed with running interlocking sutures of 0 Vicryl and a second imbricating layer was performed as well.   Bilateral tubes and ovaries appeared to be within normal limits.  Good hemostasis was noted.  Copious irrigation was performed until clear.  The peritoneum was repaired with 2-0 chromic via a running suture.  The fascia was reapproximated with a running suture of 0 Vicryl.  The skin was reapproximated with a subcuticular suture of 3-0 monocryl.  Steristrips were applied.  Instrument, sponge, and needle counts were correct prior to abdominal closure and at the conclusion of the case.  The patient was awaiting transfer to the recovery room in good condition.  Findings: Live female infant with Apgars 9 at one minute and 9 at five minutes.  Normal appearing bilateral ovaries and fallopian tubes were noted.  Estimated Blood Loss:  561 ml         Drains: foley to gravity 100 cc         Total IV Fluids: 1600 ml         Specimens to Pathology: None         Complications:  None; patient tolerated the procedure well.  Disposition: PACU - hemodynamically stable.         Condition: stable  Attending Attestation: I performed the procedure.

## 2019-10-09 NOTE — Lactation Note (Addendum)
This note was copied from a baby's chart. Lactation Consultation Note  Patient Name: Leslie [name] [id number] Date: 10/09/2019 Reason for consult: Initial assessment   Baby 7 hours old and called to assist with latching by Stana Bunting RN. Mother has been nauseous, vomiting and sleepy.  Her nipples evert and compressible. Reviewed hand expression.  Baby cueing in FOB's arms. Provided pillows for support and latched baby with full assist from [facility]. LC guided eager infant's head deep on breast.  Demonstrated to FOB how to provided support.  Intermittent swallows observed for approx 15 min. Discussed feeding on demand 8-12 times per day with FOB. Mother falling in and out of sleep during feeding.  Will need lactation basics teaching when feeling better.  Provided lactation brochure for resources, questions after discharge.  When [place] left room, infant was becoming sleepy at the breast.  FOB was at bedside supporting infant's head.   Maternal Data    Feeding Feeding Type: Breast Fed  LATCH Score Latch: Grasps breast easily, tongue down, lips flanged, rhythmical sucking.  Audible Swallowing: A few with stimulation  Type of Nipple: Everted at rest and after stimulation  Comfort (Breast/Nipple): Soft / non-tender  Hold (Positioning): Full assist, staff holds infant at breast  LATCH Score: 7  Interventions Interventions: Assisted with latch;Skin to skin;Hand express;Adjust position;Support pillows  Lactation Tools Discussed/Used     Consult Status Consult Status: Follow-up Date: 10/10/19 Follow-up type: In-patient    Vivianne Master [facility] 10/09/2019, 1:25 PM

## 2019-10-10 ENCOUNTER — Encounter (HOSPITAL_COMMUNITY): Payer: Self-pay | Admitting: Obstetrics and Gynecology

## 2019-10-10 DIAGNOSIS — O9902 Anemia complicating childbirth: Secondary | ICD-10-CM | POA: Diagnosis present

## 2019-10-10 LAB — COMPREHENSIVE METABOLIC PANEL
ALT: 45 U/L — ABNORMAL HIGH (ref 0–44)
AST: 32 U/L (ref 15–41)
Albumin: 2 g/dL — ABNORMAL LOW (ref 3.5–5.0)
Alkaline Phosphatase: 128 U/L — ABNORMAL HIGH (ref 38–126)
Anion gap: 8 (ref 5–15)
BUN: 6 mg/dL (ref 6–20)
CO2: 25 mmol/L (ref 22–32)
Calcium: 8.7 mg/dL — ABNORMAL LOW (ref 8.9–10.3)
Chloride: 102 mmol/L (ref 98–111)
Creatinine, Ser: 0.79 mg/dL (ref 0.44–1.00)
GFR calc Af Amer: >60 mL/min (ref >60)
GFR calc non Af Amer: >60 mL/min (ref >60)
Glucose, Bld: 108 mg/dL — ABNORMAL HIGH (ref 70–99)
Potassium: 4.3 mmol/L (ref 3.5–5.1)
Sodium: 135 mmol/L (ref 135–145)
Total Bilirubin: 0.6 mg/dL (ref 0.3–1.2)
Total Protein: 5.2 g/dL — ABNORMAL LOW (ref 6.5–8.1)

## 2019-10-10 LAB — CBC WITH DIFFERENTIAL/PLATELET
Abs Immature Granulocytes: 0.01 10*3/uL (ref 0.00–0.07)
Basophils Absolute: 0 10*3/uL (ref 0.0–0.1)
Basophils Relative: 0 %
Eosinophils Absolute: 0 10*3/uL (ref 0.0–0.5)
Eosinophils Relative: 1 %
HCT: 28.4 % — ABNORMAL LOW (ref 36.0–46.0)
Hemoglobin: 8.9 g/dL — ABNORMAL LOW (ref 12.0–15.0)
Immature Granulocytes: 0 %
Lymphocytes Relative: 25 %
Lymphs Abs: 1.7 10*3/uL (ref 0.7–4.0)
MCH: 28.3 pg (ref 26.0–34.0)
MCHC: 31.3 g/dL (ref 30.0–36.0)
MCV: 90.4 fL (ref 80.0–100.0)
Monocytes Absolute: 0.5 10*3/uL (ref 0.1–1.0)
Monocytes Relative: 8 %
Neutro Abs: 4.5 10*3/uL (ref 1.7–7.7)
Neutrophils Relative %: 66 %
Platelets: 223 10*3/uL (ref 150–400)
RBC: 3.14 MIL/uL — ABNORMAL LOW (ref 3.87–5.11)
RDW: 14.6 % (ref 11.5–15.5)
WBC: 6.8 10*3/uL (ref 4.0–10.5)
nRBC: 0 % (ref 0.0–0.2)

## 2019-10-10 LAB — PROTEIN / CREATININE RATIO, URINE
Creatinine, Urine: 111.8 mg/dL
Protein Creatinine Ratio: 0.05 mg/mg{Cre} (ref 0.00–0.15)
Total Protein, Urine: 6 mg/dL

## 2019-10-10 LAB — URIC ACID: Uric Acid, Serum: 6.5 mg/dL (ref 2.5–7.1)

## 2019-10-10 LAB — LACTATE DEHYDROGENASE: LDH: 150 U/L (ref 98–192)

## 2019-10-10 MED ORDER — POLYSACCHARIDE IRON COMPLEX 150 MG PO CAPS
150.0000 mg | ORAL_CAPSULE | Freq: Every day | ORAL | Status: DC
  Administered 2019-10-10 – 2019-10-11 (×2): 150 mg via ORAL
  Filled 2019-10-10 (×2): qty 1

## 2019-10-10 MED ORDER — MAGNESIUM OXIDE 400 (241.3 MG) MG PO TABS
400.0000 mg | ORAL_TABLET | Freq: Every day | ORAL | Status: DC
  Administered 2019-10-10 – 2019-10-11 (×2): 400 mg via ORAL
  Filled 2019-10-10 (×2): qty 1

## 2019-10-10 NOTE — Lactation Note (Addendum)
This note was copied from a baby's chart. Lactation Consultation Note Attempted to see mom and baby. Mom giving bottle. Mentioned to mom try to BF first then supplement.  Asked mom to call for LC BF assistance tonight. Mom stated OK.  Patient Name: Leslie [name] Date: 10/10/2019 Reason for consult: Follow-up assessment;Mother's request   Maternal Data    Feeding Feeding Type: Donor Breast Milk Nipple Type: Extra Slow Flow  LATCH Score Latch: Repeated attempts needed to sustain latch, nipple held in mouth throughout feeding, stimulation needed to elicit sucking reflex.  Audible Swallowing: A few with stimulation  Type of Nipple: Everted at rest and after stimulation  Comfort (Breast/Nipple): Soft / non-tender  Hold (Positioning): Assistance needed to correctly position infant at breast and maintain latch.  LATCH Score: 7  Interventions Interventions: Breast feeding basics reviewed  Lactation Tools Discussed/Used     Consult Status Consult Status: Follow-up Date: 10/10/19 Follow-up type: In-patient    Theodoro Kalata 10/10/2019, 8:14 PM

## 2019-10-10 NOTE — Progress Notes (Signed)
CSW received consult for hx of Postpartum Anxiety and Depression and [place] 11.  CSW met with MOB at bedside to offer support and complete assessment. On arrival, CSW introduced self and stated purpose for visit. FOB and infant SeKou were present, however, after PPD/A and SIDS education FOB stepped out of room to offer MOB privacy during assessment. MOB and FOB were pleasant and engaged. MOB's affect was flat, however, MOB stated she was tired and in some pain from C/S.   CSW provided education regarding the baby blues period vs. perinatal mood disorders, discussed treatment and gave resources for mental health follow up if concerns arise.  CSW recommends self-evaluation during the postpartum time period using the New Mom Checklist from Postpartum Progress and encouraged MOB and FOB to contact a medical professional if symptoms are noted at any time. MOB and FOB agreed and denied any questions.   CSW provided review of Sudden Infant Death Syndrome (SIDS) precautions. MOB confirmed having all needed items for baby including car seat and bassinet for baby's safe sleep.   During assessment, MOB reported history of depression and anxiety, and postpartum depression/anxiety. Depression and anxiety started during high school and have been managed in the past via journaling and Zoloft . MOB stated she stopped Zoloft because she did not like how it made her feel. MOB only took RX for two years. MOB stated plans to locate a therapist as she would prefer to learning coping skills vs taken medication. MOB reported PPD sx as crying a lot, feeling anxious, isolation, and irritability. MOB stated sx lasted for about one year. MOB reported no treatment for sx at that time. MOB stated " I thugged it out and it just passed". CSW provided encouragement,  reviewed resources, and offered stratigies such as exercise, discussing needs with support system, and getting connected with counselor. MOB was appreciative. MOB identified  FOB, mom, and FOB's mom as support system. MOB denied any SI, HI, or domestic violence.  MOB stated EDPS score is related to birthing experience and disappointment with having another C/S because she desired vaginal birth this time. However, MOB stated she is happy infant is here, healthy, and beautiful.   CSW identifies no further need for intervention and no barriers to discharge at this time.  Kamilia Carollo D. Lissa Morales, MSW, [facility] Clinical Social Worker [phone]

## 2019-10-10 NOTE — Lactation Note (Signed)
This note was copied from a baby's chart. Lactation Consultation Note Baby 70 hrs old. Sleeping soundly. Time to attempt feeding. Mom states baby isn't hungry acting and she's worried. Baby sleepy, not wanting to open eyes w/stimulation of sitting baby upright, patting and rubbing on his back. W/gloved finger assess suck for stimulation. Baby tongue thrusting and chewing.  Noted tongue w/slight heart shape.  Mom has Med/Lg. Size nipples. Baby would latch the tongue thrust pushing nipple out of mouth. Done this repeatedly. Suckling on his tongue. Doesn't open very wide.  Newborn behavior discussed. Encouraged mom to try w/cues or every 3 hrs if hasn't cued. FOB sleeping in chair, mom is sleepy. LC swaddled baby placed in bassinet.  Encouraged mom to call for  Assistance.  Patient Name: Boy Alanny Rivers UIQNV'V Date: 10/10/2019 Reason for consult: Follow-up assessment;Difficult latch   Maternal Data    Feeding Feeding Type: Breast Milk  LATCH Score Latch: Too sleepy or reluctant, no latch achieved, no sucking elicited.  Audible Swallowing: None  Type of Nipple: Everted at rest and after stimulation  Comfort (Breast/Nipple): Soft / non-tender  Hold (Positioning): Full assist, staff holds infant at breast  LATCH Score: 4  Interventions Interventions: Breast feeding basics reviewed;Support pillows;Assisted with latch;Position options;Skin to skin;Expressed milk;Breast massage;Hand express;Breast compression;Adjust position  Lactation Tools Discussed/Used WIC Program: Yes   Consult Status Consult Status: Follow-up Date: 10/10/19 Follow-up type: In-patient    Cresencia Asmus, [place]ta [place] 10/10/2019, 3:59 AM

## 2019-10-10 NOTE — Progress Notes (Signed)
Subjective: POD# 1 Live born female  Birth Weight: 10 lb 0.4 oz (4547 g) APGAR: 9, 9  Newborn Delivery   Birth date/time: 10/09/2019 03:52:00 Delivery type: C-Section, Low Transverse Trial of labor: Yes C-section categorization: Repeat      Delivering provider: Everett Graff   circumcision declines Feeding: breast, supplementing w/ donor milk  Pain control at delivery: Spinal   Reports feeling well  Patient reports tolerating PO.   Breast symptoms:+ colostrum Pain controlled with PO meds Denies HA/SOB/C/P/N/V/dizziness. Flatus absent. She reports vaginal bleeding as normal, without clots.  She is ambulating, urinating without difficulty.     Objective:   VS:    Vitals:   10/10/19 0000 10/10/19 0130 10/10/19 0400 10/10/19 0629  BP:    112/66  Pulse:    85  Resp: 18 20 18 18   Temp:    98.6 F (37 C)  TempSrc:    Oral  SpO2:      Weight:      Height:          Intake/Output Summary (Last 24 hours) at 10/10/2019 0937 Last data filed at 10/10/2019 8891 Gross per 24 hour  Intake 859.79 ml  Output 1775 ml  Net -915.21 ml        Recent Labs    10/09/19 0950 10/10/19 0443  WBC 8.7 6.8  HGB 11.1* 8.9*  HCT 35.0* 28.4*  PLT 252 223     Blood type: --/--/O POS, O POS Performed at [facility], [place] [address] [place], [place] [address]  (07/01 0700)  Rubella:   Immune   Physical Exam:  General: alert, cooperative and no distress CV: Regular rate and rhythm Resp: clear Abdomen: soft, nontender, decreased bowel sounds Incision: clean, dry and intact Uterine Fundus: firm, below umbilicus, nontender Lochia: minimal Ext: no edema, redness or tenderness in the calves or thighs   Assessment/Plan: 24 y.o.   POD# 1. [id number]                  Principal Problem:   Postpartum care following cesarean delivery 7/2 Active Problems:   BMI 45.0-49.9, adult (HCC)   Hx of postpartum depression, currently pregnant   Gestational hypertension   R C/S - failed  TOLAC   Maternal anemia, with delivery, superimposed on IDA  - asymptomatic  - started oral Fe and Mag Ox  Doing well, stable.               Advance diet as tolerated Encourage rest when baby rests Breastfeeding support Encourage to ambulate, warm fluids to increase gut motility Routine post-op care  Juliene Pina, CNM, MSN 10/10/2019, 9:37 AM

## 2019-10-10 NOTE — Lactation Note (Signed)
This note was copied from a baby's chart. Lactation Consultation Note  Patient Name: Boy Ruthie Berch AXKPV'V Date: 10/10/2019 Reason for consult: Follow-up assessment;Mother's request  Telephone call for assist with feeding.  Infant sleepy and does not want to latch.  Three hours approximately since tongue revision.  At this time infant would not open wide and latch well.  Infant opens but just gets on nipple tip and is doing a lot of tongue sucking,  Urged mom to keep hand expressing and pumping.  Urged mom to try again in about an hour or so when tylenol should be worn off. Left STS Maternal Data    Feeding Feeding Type: Breast Fed  LATCH Score Latch: Repeated attempts needed to sustain latch, nipple held in mouth throughout feeding, stimulation needed to elicit sucking reflex.  Audible Swallowing: A few with stimulation  Type of Nipple: Everted at rest and after stimulation  Comfort (Breast/Nipple): Soft / non-tender  Hold (Positioning): Assistance needed to correctly position infant at breast and maintain latch.  LATCH Score: 7  Interventions Interventions: Breast feeding basics reviewed  Lactation Tools Discussed/Used     Consult Status Consult Status: Follow-up Date: 10/10/19 Follow-up type: In-patient    Nasire Reali Thompson Caul 10/10/2019, 7:26 PM

## 2019-10-11 DIAGNOSIS — D62 Acute posthemorrhagic anemia: Secondary | ICD-10-CM | POA: Diagnosis not present

## 2019-10-11 MED ORDER — POLYSACCHARIDE IRON COMPLEX 150 MG PO CAPS: 150.0000 mg | ORAL_CAPSULE | Freq: Every day | ORAL | 2 refills | Status: DC

## 2019-10-11 MED ORDER — OXYCODONE HCL 5 MG PO TABS: 5.0000 mg | ORAL_TABLET | ORAL | 0 refills | Status: DC | PRN

## 2019-10-11 MED ORDER — MAGNESIUM OXIDE 400 (241.3 MG) MG PO TABS: 400.0000 mg | ORAL_TABLET | Freq: Every day | ORAL | 0 refills | Status: DC

## 2019-10-11 MED ORDER — SENNOSIDES-DOCUSATE SODIUM 8.6-50 MG PO TABS: 2.0000 | ORAL_TABLET | ORAL | 0 refills | Status: DC

## 2019-10-11 MED ORDER — ACETAMINOPHEN 500 MG PO TABS: 1000.0000 mg | ORAL_TABLET | Freq: Four times a day (QID) | ORAL | 0 refills | Status: DC

## 2019-10-11 NOTE — Discharge Summary (Signed)
RCS OB Discharge Summary     Patient Name: Leslie [name] DOB: 24-Mar-1996 MRN: [id number]  Date of admission: 10/08/2019 Delivering MD: Everett Graff  Date of delivery: 10/09/2019 Type of delivery: RCS  Newborn Data: Sex: Baby female Circumcision: No circ desired Live born female  Birth Weight: 10 lb 0.4 oz (4547 g) APGAR: 35, 9  Newborn Delivery   Birth date/time: 10/09/2019 03:52:00 Delivery type: C-Section, Low Transverse Trial of labor: Yes C-section categorization: Repeat      Feeding: breast and bottle Infant being discharge to home with mother in stable condition.   Admitting diagnosis: Normal labor [O80, Z37.9] Premature uterine contractions [O47.9] Intrauterine pregnancy: [redacted]w[redacted]d    Secondary diagnosis:  Principal Problem:   Postpartum care following cesarean delivery 7/2 Active Problems:   BMI 45.0-49.9, adult (HCC)   Hx of postpartum depression, currently pregnant   Gestational hypertension   R C/S - failed TOLAC   Maternal anemia, with delivery, superimposed on IDA   Anemia due to acute blood loss                                Complications: None                                                              Intrapartum Procedures: cesarean: low cervical, transverse Postpartum Procedures: none Complications-Operative and Postpartum: none Augmentation: N/A   History of Present Illness: Ms. Leslie Gwinneris a 24y.o. female, G2P2002, who presents at 443w1deeks gestation. [facility]  Her pregnancy has been complicated by:  Patient Active Problem List   Diagnosis Date Noted   Anemia due to acute blood loss 10/11/2019   Postpartum care following cesarean delivery 7/2 10/10/2019   Maternal anemia, with delivery, superimposed on IDA 10/10/2019   [place] 45.[place]-49.9, [place]   Hx of postpartum depression, currently pregnant 10/09/2019   Gestational hypertension 10/09/2019   R C/S -  failed TOLAC 10/09/2019   Pure hypercholesterolemia 06/12/2016    Hospital course:  Onset of Labor With Unplanned C/S   [age].o. yo G2P2002 at 4086w1ds admitted in Latent Labor on 10/08/2019. Patient had a labor course significant for arrived on 7/1 with SROM, received epidural but pain was not tolerated well, desired TOLAC, changed her mind and desired to proceed with RCS 7/1. The patient went for cesarean section due to Elective Repeat. Delivery details as follows: Membrane Rupture Time/Date: 2:30 AM ,10/08/2019   Delivery Method:C-Section, Low Transverse  Details of operation can be found in separate operative note. Patient had an uncomplicated postpartum course.  She is ambulating,tolerating a regular diet, passing flatus, and urinating well.  Patient is discharged home in stable condition 10/11/19.  Newborn Data: Birth date:10/09/2019  Birth time:3:52 AM  Gender:Female  Living status:Living  Apgars:9 ,9  [id number]  Hospital Course--Scheduled Cesarean: Patient was admitted on 7/1 for a see above cesarean delivery.   She was taken to the operating room, where Dr. RobMancel Balerformed a repeat LTCS under spinal anesthesia, with delivery of a viable baby female no circ desired, with weight and Apgars as listed below. Infant was in good condition and  remained at the patient's bedside.  The patient was taken to recovery in good condition.  Patient planned to breast and bottle feed.  On post-op day 1, patient was doing well, tolerating a regular diet, with Hgb of 12-8.9, asymptomatic, on iron, QBL during surgery was 524ms, then due to uterine atony, another 200, TXA, methergine and pitcoin were given, hemostatic. During her stay she was noted to having elevated BP which met criteria for GHTN, BP was 140/90s, currently 116/67, denies HA, RUQ pain or vision changes. PCR was 0.12 with unremarkable labs, repeats labs remained stable. Slight elevated in Alt with downward trend 52-45. AST 42-32 Throughout her  stay, her physical exam was WNL, her incision was CDI, and her vital signs remained stable.  By post-op day 1, she was up ad lib, tolerating a regular diet, with good pain control with po med.  She was deemed to have received the full benefit of her hospital stay, and was discharged home in stable condition.  Contraceptive choice was po meds.  Pt has h.o anxiety and depression, denies SI/HI, no meds, will f/u in one week for mood check. BMI 50 pt has been on lovenox SQ PP prophylactic.   Physical exam  Vitals:   10/10/19 1711 10/10/19 1932 10/10/19 2339 10/11/19 0409  BP: 120/61 124/67 117/61 116/69  Pulse: 97 95 98 87  Resp:  _0 Temp:  98.2 F (36.8 C) 98.6 F (37 C) 98.4 F (36.9 C)  TempSrc:  Oral Oral Oral  SpO2:  100% 100% 99%  Weight:      Height:       General: alert, cooperative and no distress Lochia: appropriate Uterine Fundus: firm Incision: Healing well with no significant drainage, No significant erythema, Dressing is clean, dry, and intact, honeycomb dressing CDI Perineum: Intact DVT Evaluation: No evidence of DVT seen on physical exam. Negative Homan's sign. No cords or calf tenderness. No significant calf/ankle edema.  Labs: Lab Results  Component Value Date   WBC 6.8 10/10/2019   HGB 8.9 (L) 10/10/2019   HCT 28.4 (L) 10/10/2019   MCV 90.4 10/10/2019   PLT 223 10/10/2019   CMP Latest Ref Rng & Units 10/10/2019  Glucose 70 - 99 mg/dL 108(H)  BUN 6 - 20 mg/dL 6  Creatinine 0.44 - 1.00 mg/dL 0.79  Sodium 135 - 145 mmol/L 135  Potassium 3.5 - 5.1 mmol/L 4.3  Chloride 98 - 111 mmol/L 102  CO2 22 - 32 mmol/L 25  Calcium 8.9 - 10.3 mg/dL 8.7(L)  Total Protein 6.5 - 8.1 g/dL 5.2(L)  Total Bilirubin 0.3 - 1.2 mg/dL 0.6  Alkaline Phos 38 - 126 U/L 128(H)  AST 15 - 41 U/L 32  ALT 0 - 44 U/L 45(H)    Date of discharge: 10/11/2019 Discharge Diagnoses: Term Pregnancy-delivered and GHTN Discharge instruction: per After Visit Summary and "Baby and Me  Booklet".  After visit meds:   Activity:           unrestricted and pelvic rest Advance as tolerated. Pelvic rest for 6 weeks.  Diet:                routine Medications: PNV, Colace, Iron and tylenol, and oxy ir.  Postpartum contraception: Undecided Condition:  Pt discharge to home with baby in stable Anemia: Po Iron daily GHTN: Monitor BP, f/u in one week at [facility] for BP check, and mood check for history of anxiety and depression.    Meds: Allergies as of 10/11/2019  No Known Allergies     Medication List    TAKE these medications   acetaminophen 500 MG tablet Commonly known as: TYLENOL Take 2 tablets (1,000 mg total) by mouth every 6 (six) hours.   iron polysaccharides 150 MG capsule Commonly known as: NIFEREX Take 1 capsule (150 mg total) by mouth daily. Start taking on: October 12, 2019   magnesium oxide 400 (241.3 Mg) MG tablet Commonly known as: MAG-OX Take 1 tablet (400 mg total) by mouth daily. Start taking on: October 12, 2019   oxyCODONE 5 MG immediate release tablet Commonly known as: Oxy IR/ROXICODONE Take 1 tablet (5 mg total) by mouth every 4 (four) hours as needed for moderate pain.   PRENATAL GUMMIES/DHA & FA PO Take 2 tablets by mouth daily.   senna-docusate 8.6-50 MG tablet Commonly known as: Senokot-S Take 2 tablets by mouth daily. Start taking on: October 12, 2019   VITAMIN C PO Take 1 tablet by mouth daily as needed (immune support).            Discharge Care Instructions  (From admission, onward)         Start     Ordered   10/11/19 0000  Discharge wound care:       Comments: Take dressing off on day 5-7 postpartum.  Report increased drainage, redness or warmth. Clean with water, let soap trickle down body. Can leave steri strips on until they fall off or take them off gently at day 10. Keep open to air, clean and dry.   10/11/19 1031          Discharge Follow Up:   Follow-up [facility] Follow  up.   Specialty: Obstetrics and Gynecology Why: 1 week BP check, and 6 weeks PPV Contact information: [address] [place] [address] [phone]               [place], NP-C, CNM 10/11/2019, 10:32 AM  Noralyn Pick, [place]

## 2019-10-11 NOTE — Lactation Note (Signed)
This note was copied from a baby's chart. Lactation Consultation Note  Patient Name: Leslie [name] ASTMH'D Date: 10/11/2019 Reason for consult: Follow-up assessment;Difficult latch Mom is currently pumping.  She is obtaining 10 mls every 3 hours.  Baby is 54 hours/5% weight loss.  Baby had frenotomy yesterday but still not latching.  Baby has been supplemented with donor milk and formula.  Offered assist when baby ready to feed.  Maternal Data    Feeding Feeding Type: Bottle Fed - Formula  LATCH Score                   Interventions    Lactation Tools Discussed/Used     Consult Status Consult Status: Follow-up Date: 10/12/19 Follow-up type: In-patient    Ave Filter 10/11/2019, 10:31 AM

## 2020-04-15 ENCOUNTER — Ambulatory Visit: Payer: PRIVATE HEALTH INSURANCE | Admitting: Family Medicine

## 2020-07-15 ENCOUNTER — Other Ambulatory Visit (HOSPITAL_COMMUNITY): Payer: Self-pay | Admitting: Obstetrics and Gynecology

## 2020-07-15 ENCOUNTER — Other Ambulatory Visit: Payer: Self-pay | Admitting: Obstetrics and Gynecology

## 2020-07-15 DIAGNOSIS — O034 Incomplete spontaneous abortion without complication: Secondary | ICD-10-CM

## 2020-07-20 ENCOUNTER — Ambulatory Visit (HOSPITAL_BASED_OUTPATIENT_CLINIC_OR_DEPARTMENT_OTHER): Admission: RE | Admit: 2020-07-20 | Payer: Medicaid Other | Source: Ambulatory Visit

## 2020-08-12 ENCOUNTER — Ambulatory Visit (HOSPITAL_BASED_OUTPATIENT_CLINIC_OR_DEPARTMENT_OTHER): Payer: Medicaid Other

## 2020-08-19 ENCOUNTER — Other Ambulatory Visit: Payer: Self-pay

## 2020-08-19 ENCOUNTER — Ambulatory Visit (HOSPITAL_BASED_OUTPATIENT_CLINIC_OR_DEPARTMENT_OTHER)
Admission: RE | Admit: 2020-08-19 | Discharge: 2020-08-19 | Disposition: A | Discharge status: Home / Self Care | Payer: Medicaid Other | Source: Ambulatory Visit | Attending: Obstetrics and Gynecology | Admitting: Obstetrics and Gynecology

## 2020-08-19 DIAGNOSIS — O034 Incomplete spontaneous abortion without complication: Secondary | ICD-10-CM | POA: Insufficient documentation

## 2020-10-27 ENCOUNTER — Telehealth: Payer: Medicaid Other | Admitting: Physician Assistant

## 2020-10-27 DIAGNOSIS — A084 Viral intestinal infection, unspecified: Secondary | ICD-10-CM

## 2020-10-27 MED ORDER — ONDANSETRON 4 MG PO TBDP: 4.0000 mg | ORAL_TABLET | Freq: Three times a day (TID) | ORAL | 0 refills | Status: DC | PRN

## 2020-10-27 NOTE — Progress Notes (Signed)
Virtual Visit Consent   Leslie [name], you are scheduled for a virtual visit with a [facility] provider today.     Just as with appointments in the office, your consent must be obtained to participate.  Your consent will be active for this visit and any virtual visit you may have with one of our providers in the next 365 days.     If you have a MyChart account, a copy of this consent can be sent to you electronically.  All virtual visits are billed to your insurance company just like a traditional visit in the office.    As this is a virtual visit, video technology does not allow for your provider to perform a traditional examination.  This may limit your provider's ability to fully assess your condition.  If your provider identifies any concerns that need to be evaluated in person or the need to arrange testing (such as labs, EKG, etc.), we will make arrangements to do so.     Although advances in technology are sophisticated, we cannot ensure that it will always work on either your end or our end.  If the connection with a video visit is poor, the visit may have to be switched to a telephone visit.  With either a video or telephone visit, we are not always able to ensure that we have a secure connection.     I need to obtain your verbal consent now.   Are you willing to proceed with your visit today?    Leslie [name] has provided verbal consent on 10/27/2020 for a virtual visit (video or telephone).   Leeanne Rio, [place]   Date: 10/27/2020 11:47 AM   Virtual Visit via Video Note   I, Leeanne Rio, connected with  Leslie [name]  ([id number], February 27, 1996) on 10/27/20 at 11:30 AM EDT by a video-enabled telemedicine application and verified that I am speaking with the correct person using two identifiers.  Location: Patient: Virtual Visit Location Patient: Home Provider: Virtual Visit Location Provider: Home Office   I discussed the limitations of evaluation and management by  telemedicine and the availability of in person appointments. The patient expressed understanding and agreed to proceed.    History of Present Illness: Leslie [name] is a 25 y.o. who identifies as a female who was assigned female at birth, and is being seen today for GI symptoms starting 2 days ago. Patient endorses nausea with non-bloody emesis (x 3) and diarrhea. Denies melena, hematochezia or tenesmus. Some occasional mild abdominal cramping. Temperature around 99. Denies recent travel or known sick contact. Denies abnormal food or water source. Has been trying to keep hydrated.  HPI: HPI  Problems:  Patient Active Problem List   Diagnosis Date Noted   Anemia due to acute blood loss 10/11/2019   Postpartum care following cesarean delivery 7/2 10/10/2019   Maternal anemia, with delivery, superimposed on IDA 10/10/2019   BMI 45.0-49.9, adult ([place]) 10/09/2019   Hx of postpartum depression, currently pregnant 10/09/2019   Gestational hypertension 10/09/2019   R C/S - failed TOLAC 10/09/2019   Pure hypercholesterolemia 06/12/2016    Allergies: No Known Allergies Medications:  Current Outpatient Medications:    ondansetron (ZOFRAN-ODT) 4 MG disintegrating tablet, Take 1 tablet (4 mg total) by mouth every 8 (eight) hours as needed for nausea or vomiting., Disp: 20 tablet, Rfl: 0   PHEXXI 1.8-1-0.4 % GEL, Place vaginally., Disp: , Rfl:   Observations/Objective: Patient is well-developed, well-nourished in no acute distress.  Resting  comfortably at home.  Head is normocephalic, atraumatic.  No labored breathing. Speech is clear and coherent with logical content.  Patient is alert and oriented at baseline.   Assessment and Plan: 1. Viral gastroenteritis - ondansetron (ZOFRAN-ODT) 4 MG disintegrating tablet; Take 1 tablet (4 mg total) by mouth every 8 (eight) hours as needed for nausea or vomiting.  Dispense: 20 tablet; Refill: 0 Afebrile at present. No alarm signs/symptoms. Suspect viral  gastroenteritis. Supportive measures discussed. Wills tart [employer]. Rx Zofran. Ok to use OTC immodium if needed. Strict ER precautions discussed with patient.   Follow Up Instructions: I discussed the assessment and treatment plan with the patient. The patient was provided an opportunity to ask questions and all were answered. The patient agreed with the plan and demonstrated an understanding of the instructions.  A copy of instructions were sent to the patient via MyChart.  The patient was advised to call back or seek an in-person evaluation if the symptoms worsen or if the condition fails to improve as anticipated.  Time:  I spent 13 minutes with the patient via telehealth technology discussing the above problems/concerns.    Leeanne Rio, PA-C

## 2020-10-27 NOTE — Patient Instructions (Addendum)
Leslie [name], thank you for joining Leeanne Rio, PA-C for today's virtual visit.  While this provider is not your primary care provider (PCP), if your PCP is located in our provider database this encounter information will be shared with them immediately following your visit.  Consent: (Patient) Leslie [name] provided verbal consent for this virtual visit at the beginning of the encounter.  Current Medications:  Current Outpatient Medications:    acetaminophen (TYLENOL) 500 MG tablet, Take 2 tablets (1,000 mg total) by mouth every 6 (six) hours., Disp: 30 tablet, Rfl: 0   Ascorbic Acid (VITAMIN C PO), Take 1 tablet by mouth daily as needed (immune support)., Disp: , Rfl:    iron polysaccharides (NIFEREX) 150 MG capsule, Take 1 capsule (150 mg total) by mouth daily., Disp: 30 capsule, Rfl: 2   magnesium oxide (MAG-OX) 400 (241.3 Mg) MG tablet, Take 1 tablet (400 mg total) by mouth daily., Disp: 30 tablet, Rfl: 0   oxyCODONE (OXY IR/ROXICODONE) 5 MG immediate release tablet, Take 1 tablet (5 mg total) by mouth every 4 (four) hours as needed for moderate pain., Disp: 20 tablet, Rfl: 0   Prenatal MV-Min-FA-Omega-3 (PRENATAL GUMMIES/DHA & FA PO), Take 2 tablets by mouth daily., Disp: , Rfl:    senna-docusate (SENOKOT-S) 8.6-50 MG tablet, Take 2 tablets by mouth daily., Disp: 30 tablet, Rfl: 0   Medications ordered in this encounter:  No orders of the defined types were placed in this encounter.    *If you need refills on other medications prior to your next appointment, please contact your pharmacy*  Follow-Up: Call back or seek an in-person evaluation if the symptoms worsen or if the condition fails to improve as anticipated.  Other Instructions Please keep well-hydrated and get plenty of rest.  Take the zofran as directed when needed for nausea/vomiting.  As you feel up to in, you can start the dietary recommendations below to help restart solid foods.  You can use Immodium if  needed to help frequency of stool.  If you note any worsening symptoms or you become unable to keep fluids in your system, you need to be evaluated at nearest Urgent Care or ER facility.   Feel better!  Bland Diet A bland diet consists of foods that are often soft and do not have a lot of fat, fiber, or extra seasonings. Foods without fat, fiber, or seasoning are easier for the body to digest. They are also less likely to irritate your mouth, throat, stomach, and other parts of your digestive system. A bland dietis sometimes called a BRAT diet. What is my plan? Your health care provider or food and nutrition specialist (dietitian) may recommend specific changes to your diet to prevent symptoms or to treat your symptoms. These changes may include: Eating small meals often. Cooking food until it is soft enough to chew easily. Chewing your food well. Drinking fluids slowly. Not eating foods that are very spicy, sour, or fatty. Not eating citrus fruits, such as oranges and grapefruit. What do I need to know about this diet? Eat a variety of foods from the bland diet food list. Do not follow a bland diet longer than needed. Ask your health care provider whether you should take vitamins or supplements. What foods can I eat? Grains  Hot cereals, such as cream of wheat. Rice. Bread, crackers, or tortillas madefrom refined white flour. Vegetables Canned or cooked vegetables. Mashed or boiled potatoes. Fruits  Bananas. Applesauce. Other types of cooked or canned fruit with  the skin andseeds removed, such as canned peaches or pears. Meats and other proteins  Scrambled eggs. Creamy peanut butter or other nut butters. Lean, well-cookedmeats, such as chicken or fish. Tofu. Soups or broths. Dairy Low-fat dairy products, such as milk, cottage cheese, or yogurt. Beverages  Water. Herbal tea. Apple juice. Fats and oils Mild salad dressings. Canola or olive oil. Sweets and desserts Pudding.  Custard. Fruit gelatin. Ice cream. The items listed above may not be a complete list of recommended foods and beverages. Contact a dietitian for more options. What foods are not recommended? Grains Whole grain breads and cereals. Vegetables Raw vegetables. Fruits Raw fruits, especially citrus, berries, or dried fruits. Dairy Whole fat dairy foods. Beverages Caffeinated drinks. Alcohol. Seasonings and condiments Strongly flavored seasonings or condiments. Hot sauce. Salsa. Other foods Spicy foods. Fried foods. Sour foods, such as pickled or fermented foods. Foodswith high sugar content. Foods high in fiber. The items listed above may not be a complete list of foods and beverages to avoid. Contact a dietitian for more information. Summary A bland diet consists of foods that are often soft and do not have a lot of fat, fiber, or extra seasonings. Foods without fat, fiber, or seasoning are easier for the body to digest. Check with your health care provider to see how long you should follow this diet plan. It is not meant to be followed for long periods. This information is not intended to replace advice given to you by your health care provider. Make sure you discuss any questions you have with your healthcare provider. Document Revised: 04/24/2017 Document Reviewed: 04/24/2017 Elsevier Patient Education  2022 [employer].   If you have been instructed to have an in-person evaluation today at a local Urgent Care facility, please use the link below. It will take you to a list of all of our available [facility], including address, phone number and hours of operation. Please do not delay care.  [facility]  If you or a family member do not have a primary care provider, use the link below to schedule a visit and establish care. When you choose a [facility] primary care physician or advanced practice provider, you gain a long-term partner in health. Find a Primary  Care Provider  Learn more about [facility]'s in-office and virtual care options: [facility]

## 2021-01-18 ENCOUNTER — Telehealth: Payer: Medicaid Other | Admitting: Family

## 2021-01-18 DIAGNOSIS — J029 Acute pharyngitis, unspecified: Secondary | ICD-10-CM

## 2021-01-18 DIAGNOSIS — Z8709 Personal history of other diseases of the respiratory system: Secondary | ICD-10-CM | POA: Diagnosis not present

## 2021-01-18 MED ORDER — AMOXICILLIN-POT CLAVULANATE 875-125 MG PO TABS: 1.0000 | ORAL_TABLET | Freq: Two times a day (BID) | ORAL | 0 refills | Status: DC

## 2021-01-18 NOTE — Progress Notes (Signed)
Virtual Visit Consent   Leslie [name], you are scheduled for a virtual visit with a [facility] provider today.     Just as with appointments in the office, your consent must be obtained to participate.  Your consent will be active for this visit and any virtual visit you may have with one of our providers in the next 365 days.     If you have a MyChart account, a copy of this consent can be sent to you electronically.  All virtual visits are billed to your insurance company just like a traditional visit in the office.    As this is a virtual visit, video technology does not allow for your provider to perform a traditional examination.  This may limit your provider's ability to fully assess your condition.  If your provider identifies any concerns that need to be evaluated in person or the need to arrange testing (such as labs, EKG, etc.), we will make arrangements to do so.     Although advances in technology are sophisticated, we cannot ensure that it will always work on either your end or our end.  If the connection with a video visit is poor, the visit may have to be switched to a telephone visit.  With either a video or telephone visit, we are not always able to ensure that we have a secure connection.     I need to obtain your verbal consent now.   Are you willing to proceed with your visit today?    Leslie [name] has provided verbal consent on 01/18/2021 for a virtual visit (video or telephone).   Evelina Dun, FNP   Date: 01/18/2021 12:44 PM   Virtual Visit via Video Note   I, Evelina Dun, connected with  Leslie [name]  ([id number], April 16, 1995) on 01/18/21 at 12:30 PM EDT by a video-enabled telemedicine application and verified that I am speaking with the correct person using two identifiers.  Location: Patient: Virtual Visit Location Patient: Home Provider: Virtual Visit Location Provider: Home   I discussed the limitations of evaluation and management by telemedicine and the  availability of in person appointments. The patient expressed understanding and agreed to proceed.    History of Present Illness: Leslie [name] is a 25 y.o. who identifies as a female who was assigned female at birth, and is being seen today for sore throat that started last night. She reports she has a history of peritonsillar abscess and does not want it to get like that again. She has seen ENT in the place, but has been over a year ago.   HPI: Sore Throat  This is a new problem. The current episode started yesterday. The problem has been gradually worsening. The pain is worse on the left side. There has been no fever. The pain is at a severity of 7/10. The pain is mild. Associated symptoms include headaches, swollen glands and trouble swallowing. Pertinent negatives include no congestion, coughing, ear discharge, ear pain, hoarse voice, plugged ear sensation, shortness of breath or vomiting. She has had no exposure to strep. She has tried acetaminophen and NSAIDs for the symptoms. The treatment provided mild relief.   Problems:  Patient Active Problem List   Diagnosis Date Noted   Anemia due to acute blood loss 10/11/2019   Postpartum care following cesarean delivery 7/2 10/10/2019   Maternal anemia, with delivery, superimposed on IDA 10/10/2019   BMI 45.0-49.9, adult ([place]) 10/09/2019   Hx of postpartum depression, currently pregnant 10/09/2019   Gestational  hypertension 10/09/2019   R C/S - failed TOLAC 10/09/2019   Pure hypercholesterolemia 06/12/2016    Allergies: No Known Allergies Medications:  Current Outpatient Medications:    amoxicillin-clavulanate (AUGMENTIN) 875-125 MG tablet, Take 1 tablet by mouth 2 (two) times daily., Disp: 20 tablet, Rfl: 0   PHEXXI 1.8-1-0.4 % GEL, Place vaginally., Disp: , Rfl:   Observations/Objective: Patient is well-developed, well-nourished in no acute distress.  Resting comfortably  at home.  Head is normocephalic, atraumatic.  No labored  breathing.  Speech is clear and coherent with logical content.  Patient is alert and oriented at baseline.  Throat erythemas, no abscess noted.   Assessment and Plan: 1. Sore throat - amoxicillin-clavulanate (AUGMENTIN) 875-125 MG tablet; Take 1 tablet by mouth 2 (two) times daily.  Dispense: 20 tablet; Refill: 0  2. History of peritonsillar abscess - amoxicillin-clavulanate (AUGMENTIN) 875-125 MG tablet; Take 1 tablet by mouth 2 (two) times daily.  Dispense: 20 tablet; Refill: 0 Pt will call ENT and make follow up this week Start Augmentin Tylenol as needed for fevers and pain Work note given Go to ED if pain or swelling worsens  Follow Up Instructions: I discussed the assessment and treatment plan with the patient. The patient was provided an opportunity to ask questions and all were answered. The patient agreed with the plan and demonstrated an understanding of the instructions.  A copy of instructions were sent to the patient via MyChart unless otherwise noted below.   The patient was advised to call back or seek an in-person evaluation if the symptoms worsen or if the condition fails to improve as anticipated.  Time:  I spent 11 minutes with the patient via telehealth technology discussing the above problems/concerns.    Evelina Dun, FNP

## 2021-01-22 ENCOUNTER — Telehealth: Payer: Medicaid Other | Admitting: Nurse Practitioner

## 2021-01-22 DIAGNOSIS — Z0289 Encounter for other administrative examinations: Secondary | ICD-10-CM | POA: Diagnosis not present

## 2021-01-22 DIAGNOSIS — Z8709 Personal history of other diseases of the respiratory system: Secondary | ICD-10-CM

## 2021-01-22 NOTE — Patient Instructions (Signed)
  Leslie [name], thank you for joining Gildardo Pounds, NP for today's virtual visit.  While this provider is not your primary care provider (PCP), if your PCP is located in our provider database this encounter information will be shared with them immediately following your visit.  Consent: (Patient) Leslie [name] provided verbal consent for this virtual visit at the beginning of the encounter.  Current Medications:  Current Outpatient Medications:    amoxicillin-clavulanate (AUGMENTIN) 875-125 MG tablet, Take 1 tablet by mouth 2 (two) times daily., Disp: 20 tablet, Rfl: 0   PHEXXI 1.8-1-0.4 % GEL, Place vaginally., Disp: , Rfl:    Medications ordered in this encounter:  No orders of the defined types were placed in this encounter.    *If you need refills on other medications prior to your next appointment, please contact your pharmacy*  Follow-Up: Call back or seek an in-person evaluation if the symptoms worsen or if the condition fails to improve as anticipated.  Other Instructions WORK NOTE has been sent to mychart.    If you have been instructed to have an in-person evaluation today at a local Urgent Care facility, please use the link below. It will take you to a list of all of our available [facility], including address, phone number and hours of operation. Please do not delay care.  [facility]  If you or a family member do not have a primary care provider, use the link below to schedule a visit and establish care. When you choose a [facility] primary care physician or advanced practice provider, you gain a long-term partner in health. Find a Primary Care Provider  Learn more about [facility]'s in-office and virtual care options: [facility]

## 2021-01-22 NOTE — Progress Notes (Signed)
Virtual Visit Consent   Leslie [name], you are scheduled for a virtual visit with a [facility] provider today.     Just as with appointments in the office, your consent must be obtained to participate.  Your consent will be active for this visit and any virtual visit you may have with one of our providers in the next 365 days.     If you have a MyChart account, a copy of this consent can be sent to you electronically.  All virtual visits are billed to your insurance company just like a traditional visit in the office.    As this is a virtual visit, video technology does not allow for your provider to perform a traditional examination.  This may limit your provider's ability to fully assess your condition.  If your provider identifies any concerns that need to be evaluated in person or the need to arrange testing (such as labs, EKG, etc.), we will make arrangements to do so.     Although advances in technology are sophisticated, we cannot ensure that it will always work on either your end or our end.  If the connection with a video visit is poor, the visit may have to be switched to a telephone visit.  With either a video or telephone visit, we are not always able to ensure that we have a secure connection.     I need to obtain your verbal consent now.   Are you willing to proceed with your visit today?    Leslie [name] has provided verbal consent on 01/22/2021 for a virtual visit (video or telephone).   Gildardo Pounds, NP   Date: 01/22/2021 10:49 AM   Virtual Visit via Video Note   I, Gildardo Pounds, connected with  Leslie [name]  ([id number], 1995/08/06) on 01/22/21 at 10:45 AM EDT by a video-enabled telemedicine application and verified that I am speaking with the correct person using two identifiers.  Location: Patient: Virtual Visit Location Patient: Home Provider: Virtual Visit Location Provider: Home Office   I discussed the limitations of evaluation and management by telemedicine  and the availability of in person appointments. The patient expressed understanding and agreed to proceed.    History of Present Illness: Leslie [name] is a 25 y.o. who identifies as a female who was assigned female at birth, and is being seen today for WORK NOTE.  HPI She is was recently prescribed abx via a tele health encounter for presumed bacterial strep pharyngitis. 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 Currently notes excessive pain in throat with prolonged speaking. Needs work note to state she requires job duties over the next 2 days that do not require her to use her voice. States she was instructed by her employer that she could be accommodated if given a letter of medical necessity.   Problems:  Patient Active Problem List   Diagnosis Date Noted   Anemia due to acute blood loss 10/11/2019   Postpartum care following cesarean delivery 7/2 10/10/2019   Maternal anemia, with delivery, superimposed on IDA 10/10/2019   BMI 45.0-49.9, adult ([facility]) 10/09/2019   Hx of postpartum depression, currently pregnant 10/09/2019   Gestational hypertension 10/09/2019   R C/S - failed TOLAC 10/09/2019   Pure hypercholesterolemia 06/12/2016    Allergies: No Known Allergies Medications:  Current Outpatient Medications:    amoxicillin-clavulanate (AUGMENTIN) 875-125 MG tablet, Take 1 tablet by mouth  2 (two) times daily., Disp: 20 tablet, Rfl: 0   PHEXXI 1.8-1-0.4 % GEL, Place vaginally., Disp: , Rfl:   Observations/Objective: Patient is well-developed, well-nourished in no acute distress.  Resting comfortably in bed at home.  Head is normocephalic, atraumatic.  No labored breathing.  Speech is clear and coherent with logical content.  Patient is alert and oriented at baseline.    Assessment and Plan: 1. Encounter to obtain excuse from work Work note provided in [employer] She is aware that we do not provide or complete  FMLA or disability forms  2. History of peritonsillar abscess Continue antibiotics as prescribed.   Follow Up Instructions: I discussed the assessment and treatment plan with the patient. The patient was provided an opportunity to ask questions and all were answered. The patient agreed with the plan and demonstrated an understanding of the instructions.  A copy of instructions were sent to the patient via MyChart unless otherwise noted below.     The patient was advised to call back or seek an in-person evaluation if the symptoms worsen or if the condition fails to improve as anticipated.  Time:  I spent 7 minutes with the patient via telehealth technology discussing the above problems/concerns.    Gildardo Pounds, NP

## 2021-01-24 ENCOUNTER — Telehealth: Payer: PRIVATE HEALTH INSURANCE

## 2021-01-24 ENCOUNTER — Telehealth: Payer: Medicaid Other | Admitting: Physician Assistant

## 2021-01-24 ENCOUNTER — Encounter: Payer: Self-pay | Admitting: Physician Assistant

## 2021-01-24 DIAGNOSIS — J069 Acute upper respiratory infection, unspecified: Secondary | ICD-10-CM

## 2021-01-24 MED ORDER — FLUTICASONE PROPIONATE 50 MCG/ACT NA SUSP
2.0000 | Freq: Every day | NASAL | 0 refills | Status: DC
Start: 2021-01-24 — End: 2021-04-19

## 2021-01-24 NOTE — Progress Notes (Signed)
E-Visit for Upper Respiratory Infection   We are sorry you are not feeling well.  Here is how we plan to help!  Based on what you have shared with me, it looks like you may have a viral upper respiratory infection.  Upper respiratory infections are caused by a large number of viruses; however, rhinovirus is the most common cause.   Symptoms vary from person to person, with common symptoms including sore throat, cough, fatigue or lack of energy and feeling of general discomfort.  A low-grade fever of up to 100.4 may present, but is often uncommon.  Symptoms vary however, and are closely related to a person's age or underlying illnesses.  The most common symptoms associated with an upper respiratory infection are nasal discharge or congestion, cough, sneezing, headache and pressure in the ears and face.  These symptoms usually persist for about 3 to 10 days, but can last up to 2 weeks.  It is important to know that upper respiratory infections do not cause serious illness or complications in most cases.    Upper respiratory infections can be transmitted from person to person, with the most common method of transmission being a person's hands.  The virus is able to live on the skin and can infect other persons for up to 2 hours after direct contact.  Also, these can be transmitted when someone coughs or sneezes; thus, it is important to cover the mouth to reduce this risk.  To keep the spread of the illness at [facility], good hand hygiene is very important.  This is an infection that is most likely caused by a virus. There are no specific treatments other than to help you with the symptoms until the infection runs its course.  We are sorry you are not feeling well.  Here is how we plan to help!   For nasal congestion, you may use Saline nasal spray or nasal drops can help and can safely be used as often as needed for congestion.  For your congestion, I have prescribed Fluticasone nasal spray one spray in each  nostril twice a day  If you do not have a history of heart disease, hypertension, diabetes or thyroid disease, prostate/bladder issues or glaucoma, you may also use Sudafed to treat nasal congestion.  It is highly recommended that you consult with a pharmacist or your primary care physician to ensure this medication is safe for you to take.     If you have a cough, you may use cough suppressants such as Delsym and Robitussin unless you are pregnant. If pregnant you can drink tea with honey and use a humidifier in your room to help with symptoms.  If you have a sore or scratchy throat, use a saltwater gargle-  to  teaspoon of salt dissolved in a 4-ounce to 8-ounce glass of warm water.  Gargle the solution for approximately 15-30 seconds and then spit.  It is important not to swallow the solution.  You can also use throat lozenges/cough drops and Chloraseptic spray to help with throat pain or discomfort.  Warm or cold liquids can also be helpful in relieving throat pain.  For headache, pain or general discomfort, you can use Ibuprofen or Tylenol as directed.   Some authorities believe that zinc sprays or the use of Echinacea may shorten the course of your symptoms.   HOME CARE Only take medications as instructed by your medical team. Be sure to drink plenty of fluids. Water is fine as well as fruit  juices, sodas and electrolyte beverages. You may want to stay away from caffeine or alcohol. If you are nauseated, try taking small sips of liquids. How do you know if you are getting enough fluid? Your urine should be a pale yellow or almost colorless. Get rest. Taking a steamy shower or using a humidifier may help nasal congestion and ease sore throat pain. You can place a towel over your head and breathe in the steam from hot water coming from a faucet. Using a saline nasal spray works much the same way. Cough drops, hard candies and sore throat lozenges may ease your cough. Avoid close contacts  especially the very young and the elderly Cover your mouth if you cough or sneeze Always remember to wash your hands.   GET HELP RIGHT AWAY IF: You develop worsening fever. If your symptoms do not improve within 10 days You develop yellow or green discharge from your nose over 3 days. You have coughing fits You develop a severe head ache or visual changes. You develop shortness of breath, difficulty breathing or start having chest pain Your symptoms persist after you have completed your treatment plan  MAKE SURE YOU  Understand these instructions. Will watch your condition. Will get help right away if you are not doing well or get worse.  Thank you for choosing an e-visit.  Your e-visit answers were reviewed by a board certified advanced clinical practitioner to complete your personal care plan. Depending upon the condition, your plan could have included both over the counter or prescription medications.  Please review your pharmacy choice. Make sure the pharmacy is open so you can pick up prescription now. If there is a problem, you may contact your provider through [employer] and have the prescription routed to another pharmacy.  Your safety is important to [place]. If you have drug allergies check your prescription carefully.   For the next 24 hours you can use MyChart to ask questions about today's visit, request a non-urgent call back, or ask for a work or school excuse. You will get an email in the next two days asking about your experience. I hope that your e-visit has been valuable and will speed your recovery.   Approximately 5 minutes was spent documenting and reviewing patient's chart.

## 2021-02-01 ENCOUNTER — Telehealth: Payer: Medicaid Other | Admitting: Physician Assistant

## 2021-02-01 DIAGNOSIS — T3695XA Adverse effect of unspecified systemic antibiotic, initial encounter: Secondary | ICD-10-CM

## 2021-02-01 DIAGNOSIS — B379 Candidiasis, unspecified: Secondary | ICD-10-CM | POA: Diagnosis not present

## 2021-02-01 MED ORDER — FLUCONAZOLE 150 MG PO TABS
150.0000 mg | ORAL_TABLET | Freq: Once | ORAL | 0 refills | Status: AC
Start: 2021-02-01 — End: 2021-02-01

## 2021-02-01 NOTE — Progress Notes (Signed)
I have spent 5 minutes in review of e-visit questionnaire, review and updating patient chart, medical decision making and response to patient.   Kiamesha Samet Cody Zorion Nims, PA-C    

## 2021-02-01 NOTE — Progress Notes (Signed)

## 2021-02-06 ENCOUNTER — Telehealth: Payer: PRIVATE HEALTH INSURANCE | Admitting: Physician Assistant

## 2021-02-06 DIAGNOSIS — H938X9 Other specified disorders of ear, unspecified ear: Secondary | ICD-10-CM

## 2021-02-06 MED ORDER — GUAIFENESIN ER 600 MG PO TB12
600.0000 mg | ORAL_TABLET | Freq: Two times a day (BID) | ORAL | 0 refills | Status: DC
Start: 2021-02-06 — End: 2021-06-24

## 2021-02-06 NOTE — Progress Notes (Signed)
E-Visit for Upper Respiratory Infection   We are sorry you are not feeling well.  Here is how we plan to help!  Based on what you have shared with me, it sounds like you are having eustachian tube dysfunction caused by your upper respiratory infection. Typically this can happen when you have a lot of sinus congestion. I see that your were prescribed fluticasone on your last visit. I would continue taking this medication to help drain your sinuses which will help with the pressure in your ear. I will also give you a prescription for mucinex which will help as well.   If you do not have pain it is very unlikely that you have an ear infection and ear infections are not contagious.   HOME CARE Only take medications as instructed by your medical team. Be sure to drink plenty of fluids. Water is fine as well as fruit juices, sodas and electrolyte beverages. You may want to stay away from caffeine or alcohol. If you are nauseated, try taking small sips of liquids. How do you know if you are getting enough fluid? Your urine should be a pale yellow or almost colorless. Get rest. Taking a steamy shower or using a humidifier may help nasal congestion and ease sore throat pain. You can place a towel over your head and breathe in the steam from hot water coming from a faucet. Using a saline nasal spray works much the same way. Cough drops, hard candies and sore throat lozenges may ease your cough. Avoid close contacts especially the very young and the elderly Cover your mouth if you cough or sneeze Always remember to wash your hands.   GET HELP RIGHT AWAY IF: You develop worsening fever. If your symptoms do not improve within 10 days You develop yellow or green discharge from your nose over 3 days. You have coughing fits You develop a severe head ache or visual changes. You develop shortness of breath, difficulty breathing or start having chest pain Your symptoms persist after you have completed your  treatment plan  MAKE SURE YOU  Understand these instructions. Will watch your condition. Will get help right away if you are not doing well or get worse.  Thank you for choosing an e-visit.  Your e-visit answers were reviewed by a board certified advanced clinical practitioner to complete your personal care plan. Depending upon the condition, your plan could have included both over the counter or prescription medications.  Please review your pharmacy choice. Make sure the pharmacy is open so you can pick up prescription now. If there is a problem, you may contact your provider through [employer] and have the prescription routed to another pharmacy.  Your safety is important to [place]. If you have drug allergies check your prescription carefully.   For the next 24 hours you can use MyChart to ask questions about today's visit, request a non-urgent call back, or ask for a work or school excuse. You will get an email in the next two days asking about your experience. I hope that your e-visit has been valuable and will speed your recovery.  Approximately 5 minutes was spent documenting and reviewing patient's chart.

## 2021-02-27 ENCOUNTER — Telehealth: Payer: Medicaid Other | Admitting: Physician Assistant

## 2021-02-27 DIAGNOSIS — H109 Unspecified conjunctivitis: Secondary | ICD-10-CM | POA: Diagnosis not present

## 2021-02-27 MED ORDER — POLYMYXIN B-TRIMETHOPRIM 10000-0.1 UNIT/ML-% OP SOLN: 1.0000 [drp] | OPHTHALMIC | 0 refills | Status: DC

## 2021-02-27 NOTE — Progress Notes (Signed)

## 2021-04-19 ENCOUNTER — Telehealth: Payer: Medicaid Other | Admitting: Physician Assistant

## 2021-04-19 DIAGNOSIS — J069 Acute upper respiratory infection, unspecified: Secondary | ICD-10-CM

## 2021-04-19 MED ORDER — BENZONATATE 100 MG PO CAPS: 100.0000 mg | ORAL_CAPSULE | Freq: Three times a day (TID) | ORAL | 0 refills | Status: DC | PRN

## 2021-04-19 MED ORDER — FLUTICASONE PROPIONATE 50 MCG/ACT NA SUSP: 2.0000 | Freq: Every day | NASAL | 0 refills | Status: DC

## 2021-04-19 NOTE — Progress Notes (Signed)
I have spent 5 minutes in review of e-visit questionnaire, review and updating patient chart, medical decision making and response to patient.   Allah Reason Cody Fedor Kazmierski, PA-C    

## 2021-04-19 NOTE — Progress Notes (Signed)

## 2021-05-11 ENCOUNTER — Telehealth: Payer: Medicaid Other | Admitting: Emergency Medicine

## 2021-05-11 DIAGNOSIS — J029 Acute pharyngitis, unspecified: Secondary | ICD-10-CM

## 2021-05-11 NOTE — Progress Notes (Addendum)
E-Visit for Sore Throat  We are sorry that you are not feeling well.  Here is how we plan to help!  Your symptoms indicate a likely viral infection (Pharyngitis).   Pharyngitis is inflammation in the back of the throat which can cause a sore throat, scratchiness and sometimes difficulty swallowing.   Pharyngitis is typically caused by a respiratory virus and will just run its course.  Please keep in mind that your symptoms could last up to 10 days.  For throat pain, we recommend over the counter oral pain relief medications such as acetaminophen or aspirin, or anti-inflammatory medications such as ibuprofen or naproxen sodium.  Topical treatments such as oral throat lozenges or sprays may be used as needed. You might also try saline nasal spray and Mucinex if you also have congestion and drainage down the back of  your throat.  Avoid close contact with loved ones, especially the very young and elderly.  Remember to wash your hands thoroughly throughout the day as this is the number one way to prevent the spread of infection and wipe down door knobs and counters with disinfectant.  After careful review of your answers, I would not recommend an antibiotic for your condition.  Antibiotics should not be used to treat conditions that we suspect are caused by viruses like the virus that causes the common cold or flu. However, some people can have Strep with atypical symptoms. You may need formal testing in clinic or office to confirm if your symptoms continue or worsen.  Providers prescribe antibiotics to treat infections caused by bacteria. Antibiotics are very powerful in treating bacterial infections when they are used properly.  To maintain their effectiveness, they should be used only when necessary.  Overuse of antibiotics has resulted in the development of super bugs that are resistant to treatment!    Home Care: Only take medications as instructed by your medical team. Do not drink alcohol while  taking these medications. A steam or ultrasonic humidifier can help congestion.  You can place a towel over your head and breathe in the steam from hot water coming from a faucet. Avoid close contacts especially the very young and the elderly. Cover your mouth when you cough or sneeze. Always remember to wash your hands.  Get Help Right Away If: You develop worsening fever or throat pain. You develop a severe head ache or visual changes. Your symptoms persist after you have completed your treatment plan.  Make sure you Understand these instructions. Will watch your condition. Will get help right away if you are not doing well or get worse.   Thank you for choosing an e-visit.  Your e-visit answers were reviewed by a board certified advanced clinical practitioner to complete your personal care plan. Depending upon the condition, your plan could have included both over the counter or prescription medications.  Please review your pharmacy choice. Make sure the pharmacy is open so you can pick up prescription now. If there is a problem, you may contact your provider through [employer] and have the prescription routed to another pharmacy.  Your safety is important to [place]. If you have drug allergies check your prescription carefully.   For the next 24 hours you can use MyChart to ask questions about today's visit, request a non-urgent call back, or ask for a work or school excuse. You will get an email in the next two days asking about your experience. I hope that your e-visit has been valuable and will speed  your recovery.  I have spent 5 minutes in review of e-visit questionnaire, review and updating patient chart, medical decision making and response to patient.   Willeen Cass, PhD, FNP-BC

## 2021-05-14 ENCOUNTER — Telehealth: Payer: Medicaid Other | Admitting: Family

## 2021-05-14 DIAGNOSIS — J029 Acute pharyngitis, unspecified: Secondary | ICD-10-CM | POA: Diagnosis not present

## 2021-05-14 MED ORDER — CLINDAMYCIN HCL 300 MG PO CAPS: 300.0000 mg | ORAL_CAPSULE | Freq: Three times a day (TID) | ORAL | 0 refills | Status: DC

## 2021-05-14 NOTE — Progress Notes (Signed)
Virtual Visit Consent   [name], you are scheduled for a virtual visit with a [facility] provider today.     Just as with appointments in the office, your consent must be obtained to participate.  Your consent will be active for this visit and any virtual visit you may have with one of our providers in the next 365 days.     If you have a MyChart account, a copy of this consent can be sent to you electronically.  All virtual visits are billed to your insurance company just like a traditional visit in the office.    As this is a virtual visit, video technology does not allow for your provider to perform a traditional examination.  This may limit your provider's ability to fully assess your condition.  If your provider identifies any concerns that need to be evaluated in person or the need to arrange testing (such as labs, EKG, etc.), we will make arrangements to do so.     Although advances in technology are sophisticated, we cannot ensure that it will always work on either your end or our end.  If the connection with a video visit is poor, the visit may have to be switched to a telephone visit.  With either a video or telephone visit, we are not always able to ensure that we have a secure connection.     I need to obtain your verbal consent now.   Are you willing to proceed with your visit today?    Hermela Hardt has provided verbal consent on 05/14/2021 for a virtual visit (video or telephone).   Evelina Dun, FNP   Date: 05/14/2021 9:19 AM   Virtual Visit via Video Note   I, Evelina Dun, connected with  Helane Briceno  ([id number], 1996/02/28) on 05/14/21 at  9:15 AM EST by a video-enabled telemedicine application and verified that I am speaking with the correct person using two identifiers.  Location: Patient: Virtual Visit Location Patient: Home Provider: Virtual Visit Location Provider: Home Office   I discussed the limitations of evaluation and management by telemedicine and the  availability of in person appointments. The patient expressed understanding and agreed to proceed.    History of Present Illness: [name] is a 26 y.o. who identifies as a female who was assigned female at birth, and is being seen today for sore throat. Has hx of left abscess on tonsil and has had to have it drained.   HPI: Sore Throat  This is a new problem. The current episode started in the past 7 days. The problem has been gradually worsening. The pain is worse on the left side. There has been no fever. The pain is at a severity of 8/10. The pain is moderate. Associated symptoms include ear pain (left), headaches, swollen glands and trouble swallowing. Pertinent negatives include no congestion, coughing, shortness of breath or vomiting. She has tried acetaminophen and NSAIDs for the symptoms. The treatment provided mild relief.   Problems:  Patient Active Problem List   Diagnosis Date Noted   Anemia due to acute blood loss 10/11/2019   Postpartum care following cesarean delivery 7/2 10/10/2019   Maternal anemia, with delivery, superimposed on IDA 10/10/2019   BMI 45.0-49.9, adult ([facility]) 10/09/2019   Hx of postpartum depression, currently pregnant 10/09/2019   Gestational hypertension 10/09/2019   R C/S - failed TOLAC 10/09/2019   Pure hypercholesterolemia 06/12/2016    Allergies: No Known Allergies Medications:  Current Outpatient Medications:  clindamycin (CLEOCIN) 300 MG capsule, Take 1 capsule (300 mg total) by mouth 3 (three) times daily., Disp: 30 capsule, Rfl: 0   fluticasone (FLONASE) 50 MCG/ACT nasal spray, Place 2 sprays into both nostrils daily., Disp: 16 g, Rfl: 0   guaiFENesin (MUCINEX) 600 MG 12 hr tablet, Take 1 tablet (600 mg total) by mouth 2 (two) times daily., Disp: 6 tablet, Rfl: 0   PHEXXI 1.8-1-0.4 % GEL, Place vaginally., Disp: , Rfl:   Observations/Objective: Patient is well-developed, well-nourished in no acute distress.  Resting comfortably  at home.   Head is normocephalic, atraumatic.  No labored breathing.  Speech is clear and coherent with logical content.  Patient is alert and oriented at baseline.  Talking with lump in throat  Assessment and Plan: 1. Acute pharyngitis, unspecified etiology - clindamycin (CLEOCIN) 300 MG capsule; Take 1 capsule (300 mg total) by mouth 3 (three) times daily.  Dispense: 30 capsule; Refill: 0  - Take meds as prescribed - Use a cool mist humidifier  -Use saline nose sprays frequently -Force fluids -For any cough or congestion  Use plain Mucinex- regular strength or max strength is fine -For fever or aces or pains- take tylenol or ibuprofen. -Throat lozenges if help -New toothbrush in 3 days   Follow Up Instructions: I discussed the assessment and treatment plan with the patient. The patient was provided an opportunity to ask questions and all were answered. The patient agreed with the plan and demonstrated an understanding of the instructions.  A copy of instructions were sent to the patient via MyChart unless otherwise noted below.     The patient was advised to call back or seek an in-person evaluation if the symptoms worsen or if the condition fails to improve as anticipated.  Time:  I spent 8 minutes with the patient via telehealth technology discussing the above problems/concerns.    Evelina Dun, FNP

## 2021-05-22 ENCOUNTER — Encounter (HOSPITAL_COMMUNITY): Payer: Self-pay | Admitting: *Deleted

## 2021-05-22 ENCOUNTER — Emergency Department (HOSPITAL_COMMUNITY): Payer: Medicaid Other

## 2021-05-22 ENCOUNTER — Other Ambulatory Visit: Payer: Self-pay

## 2021-05-22 ENCOUNTER — Emergency Department (HOSPITAL_COMMUNITY)
Admission: EM | Admit: 2021-05-22 | Discharge: 2021-05-23 | Disposition: A | Discharge status: Home / Self Care | Payer: Medicaid Other | Attending: Emergency Medicine | Admitting: Emergency Medicine

## 2021-05-22 DIAGNOSIS — J020 Streptococcal pharyngitis: Secondary | ICD-10-CM | POA: Diagnosis present

## 2021-05-22 DIAGNOSIS — Z20822 Contact with and (suspected) exposure to covid-19: Secondary | ICD-10-CM | POA: Diagnosis not present

## 2021-05-22 DIAGNOSIS — L539 Erythematous condition, unspecified: Secondary | ICD-10-CM | POA: Diagnosis not present

## 2021-05-22 LAB — CBC WITH DIFFERENTIAL/PLATELET
Abs Immature Granulocytes: 0.02 10*3/uL (ref 0.00–0.07)
Basophils Absolute: 0 10*3/uL (ref 0.0–0.1)
Basophils Relative: 0 %
Eosinophils Absolute: 0.1 10*3/uL (ref 0.0–0.5)
Eosinophils Relative: 1 %
HCT: 38.8 % (ref 36.0–46.0)
Hemoglobin: 12.3 g/dL (ref 12.0–15.0)
Immature Granulocytes: 0 %
Lymphocytes Relative: 29 %
Lymphs Abs: 2.3 10*3/uL (ref 0.7–4.0)
MCH: 26.9 pg (ref 26.0–34.0)
MCHC: 31.7 g/dL (ref 30.0–36.0)
MCV: 84.9 fL (ref 80.0–100.0)
Monocytes Absolute: 0.5 10*3/uL (ref 0.1–1.0)
Monocytes Relative: 6 %
Neutro Abs: 5.1 10*3/uL (ref 1.7–7.7)
Neutrophils Relative %: 64 %
Platelets: 316 10*3/uL (ref 150–400)
RBC: 4.57 MIL/uL (ref 3.87–5.11)
RDW: 15.4 % (ref 11.5–15.5)
WBC: 7.9 10*3/uL (ref 4.0–10.5)
nRBC: 0 % (ref 0.0–0.2)

## 2021-05-22 LAB — RESP PANEL BY RT-PCR (FLU A&B, COVID) ARPGX2
Influenza A by PCR: NEGATIVE
Influenza B by PCR: NEGATIVE
SARS Coronavirus 2 by RT PCR: NEGATIVE

## 2021-05-22 LAB — BASIC METABOLIC PANEL
Anion gap: 9 (ref 5–15)
BUN: 11 mg/dL (ref 6–20)
CO2: 23 mmol/L (ref 22–32)
Calcium: 8.9 mg/dL (ref 8.9–10.3)
Chloride: 103 mmol/L (ref 98–111)
Creatinine, Ser: 0.76 mg/dL (ref 0.44–1.00)
GFR, Estimated: >60 mL/min (ref >60)
Glucose, Bld: 111 mg/dL — ABNORMAL HIGH (ref 70–99)
Potassium: 3.8 mmol/L (ref 3.5–5.1)
Sodium: 135 mmol/L (ref 135–145)

## 2021-05-22 LAB — I-STAT BETA HCG BLOOD, ED (MC, WL, AP ONLY): I-stat hCG, quantitative: <5 m[IU]/mL (ref <5)

## 2021-05-22 LAB — GROUP A STREP BY PCR: Group A Strep by PCR: DETECTED — AB

## 2021-05-22 MED ORDER — IOHEXOL 300 MG/ML  SOLN
75.0000 mL | Freq: Once | INTRAMUSCULAR | Status: AC | PRN
  Administered 2021-05-22: 75 mL via INTRAVENOUS

## 2021-05-22 NOTE — ED Provider Triage Note (Signed)
Emergency Medicine Provider Triage Evaluation Note  Leslie [name] , a 26 y.o. female  was evaluated in triage.  Pt complains of sore throat, difficulty swallowing, difficulty talking a few day duration.  Patient reports she had pharyngitis and tonsillar swelling couple weeks ago when she did a video visit and was prescribed clindamycin.  She reports she improved and did not take her clindamycin course at that time.  She reports her symptoms returned a few days ago and started the clindamycin but has not had any improvement.  Tonight she talked to on-call ENT who recommended she come to the emergency room.  Patient has history of peritonsillar abscesses.  He endorses intermittent fever over the past several days.  Review of Systems  Positive: As above Negative: As above  Physical Exam  BP (!) 159/101 (BP Location: Right Wrist)    Pulse 94    Temp 97.9 F (36.6 C) (Oral)    Resp 16    SpO2 100%  Gen:   Awake, no distress   Resp:  Normal effort  MSK:   Moves extremities without difficulty  Other:  Bilateral tonsillar swelling with exudates present worse on left.  Uvula midline.  Medical Decision Making  Medically screening exam initiated at 10:01 PM.  Appropriate orders placed.  [name] was informed that the remainder of the evaluation will be completed by another provider, this initial triage assessment does not replace that evaluation, and the importance of remaining in the ED until their evaluation is complete.     [name], PA-C 05/22/21 2202

## 2021-05-22 NOTE — ED Triage Notes (Signed)
Sore throat since Saturday, hx of peritonsillar abscess. Low grade temp.   Bilateral tonsillar swelling with white patches, no acute distress. Handling secretions without difficulty.

## 2021-05-23 MED ORDER — DEXAMETHASONE SODIUM PHOSPHATE 10 MG/ML IJ SOLN
10.0000 mg | Freq: Once | INTRAMUSCULAR | Status: AC
  Administered 2021-05-23: 10 mg via INTRAVENOUS
  Filled 2021-05-23: qty 1

## 2021-05-23 MED ORDER — CLINDAMYCIN HCL 150 MG PO CAPS
300.0000 mg | ORAL_CAPSULE | Freq: Once | ORAL | Status: AC
  Administered 2021-05-23: 300 mg via ORAL
  Filled 2021-05-23: qty 2

## 2021-05-23 NOTE — Discharge Instructions (Signed)
You were seen in the emergency department today for a sore throat.  Your strep test was positive which we suspect is the cause of your symptoms.  Your CT scan did not show findings of a peritonsillar abscess.  Please complete your course of clindamycin.  You were given steroids to help with the pain and swelling in the emergency department and we are sending you home with naproxen.  - Naproxen- this is a nonsteroidal anti-inflammatory medication that will help with pain and swelling. Be sure to take this medication as prescribed with food, 1 pill every 12 hours,  It should be taken with food, as it can cause stomach upset, and more seriously, stomach bleeding. Do not take other nonsteroidal anti-inflammatory medications with this such as Advil, Motrin, Aleve, Mobic, Goodie Powder, or Motrin etc..    You make take Tylenol per over the counter dosing with these medications.   We have prescribed you new medication(s) today. Discuss the medications prescribed today with your pharmacist as they can have adverse effects and interactions with your other medicines including over the counter and prescribed medications. Seek medical evaluation if you start to experience new or abnormal symptoms after taking one of these medicines, seek care immediately if you start to experience difficulty breathing, feeling of your throat closing, facial swelling, or rash as these could be indications of a more serious allergic reaction   Follow-up with primary care in 1 week.  Return to the emergency department for new or worsening symptoms including but limited to new or worsening pain, inability to swallow, inability to tolerate your own saliva, trouble breathing, passing out, or any other concerns.

## 2021-05-23 NOTE — ED Provider Notes (Signed)
[name] Provider Note   CSN: [id number] Arrival date & time: 05/22/21  2137     History  Chief Complaint  Patient presents with   Sore Throat    Leslie [name] is a 26 y.o. female with a hx of prior peritonsillar abscess who presents to the ED with complaints of sore throat that began last night. Patient reports that she developed a sore throat, worse with swallowing, no alleviating factors. Hx of PTA concerned her therefore she called ENT and was directed to the ED. she had a mild sore throat last week that quickly resolved, she was prescribed clindamycin at that time but did not take it due to her quick improvement.  She denies fever, chills, dyspnea, nausea, or vomiting.  HPI     Home Medications Prior to Admission medications   Medication Sig Start Date End Date Taking? Authorizing Provider  clindamycin (CLEOCIN) 300 MG capsule Take 1 capsule (300 mg total) by mouth 3 (three) times daily. 05/14/21   Sharion Balloon, FNP  fluticasone (FLONASE) 50 MCG/ACT nasal spray Place 2 sprays into both nostrils daily. 04/19/21   Brunetta Jeans, PA-C  guaiFENesin (MUCINEX) 600 MG 12 hr tablet Take 1 tablet (600 mg total) by mouth 2 (two) times daily. 02/06/21   Couture, Cortni S, PA-C  PHEXXI 1.8-1-0.4 % GEL Place vaginally. 10/21/20   [provider]      Allergies    Patient has no known allergies.    Review of Systems   Review of Systems  Constitutional:  Negative for chills and fever.  HENT:  Positive for sore throat and trouble swallowing (painful but able).   Respiratory:  Negative for cough and shortness of breath.   Cardiovascular:  Negative for chest pain.  Gastrointestinal:  Negative for nausea and vomiting.  All other systems reviewed and are negative.  Physical Exam Updated Vital Signs BP (!) 159/101 (BP Location: Right Wrist)    Pulse 94    Temp 97.9 F (36.6 C) (Oral)    Resp 16    LMP 04/24/2021 (Approximate)    SpO2  100%  Physical Exam Vitals and nursing note reviewed.  Constitutional:      General: She is not in acute distress.    Appearance: She is well-developed.  HENT:     Head: Normocephalic and atraumatic.     Right Ear: Ear canal normal. Tympanic membrane is not perforated, erythematous, retracted or bulging.     Left Ear: Ear canal normal. Tympanic membrane is not perforated, erythematous, retracted or bulging.     Ears:     Comments: No mastoid erythema/swelling/tenderness.     Nose:     Right Sinus: No maxillary sinus tenderness or frontal sinus tenderness.     Left Sinus: No maxillary sinus tenderness or frontal sinus tenderness.     Mouth/Throat:     Pharynx: Uvula midline. Posterior oropharyngeal erythema present.     Tonsils: Tonsillar exudate present. 2+ on the right. 2+ on the left.     Comments: Posterior oropharynx is symmetric appearing. Patient tolerating own secretions without difficulty. No trismus. No drooling. No hot potato voice. No swelling beneath the tongue, submandibular compartment is soft.  Eyes:     General:        Right eye: No discharge.        Left eye: No discharge.     Conjunctiva/sclera: Conjunctivae normal.     Pupils: Pupils are equal, round, and reactive to  light.  Cardiovascular:     Rate and Rhythm: Normal rate and regular rhythm.     Heart sounds: No murmur heard. Pulmonary:     Effort: Pulmonary effort is normal. No respiratory distress.     Breath sounds: Normal breath sounds. No wheezing, rhonchi or rales.  Abdominal:     General: There is no distension.     Palpations: Abdomen is soft.     Tenderness: There is no abdominal tenderness.  Musculoskeletal:     Cervical back: Normal range of motion and neck supple. No edema or rigidity.  Lymphadenopathy:     Cervical: No cervical adenopathy.  Skin:    General: Skin is warm and dry.     Findings: No rash.  Neurological:     Mental Status: She is alert.  Psychiatric:        Behavior: Behavior  normal.    ED Results / Procedures / Treatments   Labs (all labs ordered are listed, but only abnormal results are displayed) Labs Reviewed  GROUP A STREP BY PCR - Abnormal; Notable for the following components:      Result Value   Group A Strep by PCR DETECTED (*)    All other components within normal limits  BASIC METABOLIC PANEL - Abnormal; Notable for the following components:   Glucose, Bld 111 (*)    All other components within normal limits  RESP PANEL BY RT-PCR (FLU A&B, COVID) ARPGX2  CBC WITH DIFFERENTIAL/PLATELET  I-STAT BETA HCG BLOOD, ED ([facility], [facility], [facility] ONLY)    EKG None  Radiology CT Soft Tissue Neck W Contrast  Result Date: 05/22/2021 CLINICAL DATA:  Soft tissue swelling.  Difficulty breathing. EXAM: CT NECK WITH CONTRAST TECHNIQUE: Multidetector CT imaging of the neck was performed using the standard protocol following the bolus administration of intravenous contrast. RADIATION DOSE REDUCTION: This exam was performed according to the departmental dose-optimization program which includes automated exposure control, adjustment of the mA and/or kV according to patient size and/or use of iterative reconstruction technique. CONTRAST:  4mL OMNIPAQUE IOHEXOL 300 MG/ML  SOLN COMPARISON:  04/27/2018 FINDINGS: PHARYNX AND LARYNX: The nasopharynx, oropharynx and larynx are normal. Visible portions of the oral cavity, tongue base and floor of mouth are normal. Normal epiglottis, vallecula and pyriform sinuses. The larynx is normal. No retropharyngeal abscess, effusion or lymphadenopathy. SALIVARY GLANDS: Normal parotid, submandibular and sublingual glands. THYROID: Normal. LYMPH NODES: No enlarged or abnormal density lymph nodes. VASCULAR: Major cervical vessels are patent. LIMITED INTRACRANIAL: Normal. VISUALIZED ORBITS: Normal. MASTOIDS AND VISUALIZED PARANASAL SINUSES: No fluid levels or advanced mucosal thickening. No mastoid effusion. SKELETON: No bony spinal canal stenosis. No lytic  or blastic lesions. UPPER CHEST: Clear. OTHER: None. IMPRESSION: Normal CT of the neck. Electronically Signed   By: Ulyses Jarred M.D.   On: 05/22/2021 23:24    Procedures Procedures    Medications Ordered in ED Medications  iohexol (OMNIPAQUE) 300 MG/ML solution 75 mL (75 mLs Intravenous Contrast Given 05/22/21 2258)    ED Course/ Medical Decision Making/ A&P                           Medical Decision Making Risk Prescription drug management.   Patient presents to the ED with complaints of sore throat. Nontoxic, vitals w/ elevated BP- doubt HTN emergency.   Ddx including but not limited to: strep pharyngitis, viral pharyngitis, mononucleosis, PTA, RPA, allergies.    Additional history obtained:  Chart review &  nursing note review.   Work initiated in triage including labs & CT- I have reviewed:  Lab Tests:  I reviewed & interpreted labs including:  Cbc, bmp, preg test, covid/flu: unremarkable.  Strep: Positive.   Imaging Studies ordered:  I reviewed and interpreted imaging including:  Normal CT of the neck  ED Course:  Patient with findings of strep pharyngitis confirmed by PCR.  Clinical exam is not consistent with RPA/PTA additionally CT scan was ordered from triage and is unremarkable.  Patient is tolerating her secretions that difficulty.  She started taking clindamycin 300 mg 3 times daily for a 10-day course last night therefore antibiotics have been started, she reports being unable to tolerate PCN, we will have her complete her clindamycin.  Dose was given in the emergency department.   I discussed results, treatment plan, need for follow-up, and return precautions with the patient. Provided opportunity for questions, patient confirmed understanding and is in agreement with plan.   Portions of this note were generated with [employer]. Dictation errors may occur despite best attempts at proofreading.         Final Clinical Impression(s) / ED  Diagnoses Final diagnoses:  Strep pharyngitis    Rx / DC Orders ED Discharge Orders     None         Amaryllis Dyke, PA-C 05/23/21 1610    Maudie Flakes, MD 05/23/21 [phone]

## 2021-05-31 ENCOUNTER — Telehealth: Payer: Medicaid Other | Admitting: Family

## 2021-05-31 DIAGNOSIS — J069 Acute upper respiratory infection, unspecified: Secondary | ICD-10-CM | POA: Diagnosis not present

## 2021-05-31 MED ORDER — FLUTICASONE PROPIONATE 50 MCG/ACT NA SUSP: 2.0000 | Freq: Every day | NASAL | 6 refills | Status: AC

## 2021-05-31 NOTE — Progress Notes (Signed)
E-Visit for Upper Respiratory Infection   We are sorry you are not feeling well.  Here is how we plan to help!  Based on what you have shared with me, it looks like you may have a viral upper respiratory infection.  Upper respiratory infections are caused by a large number of viruses; however, rhinovirus is the most common cause.   Symptoms vary from person to person, with common symptoms including sore throat, cough, fatigue or lack of energy and feeling of general discomfort.  A low-grade fever of up to 100.4 may present, but is often uncommon.  Symptoms vary however, and are closely related to a person's age or underlying illnesses.  The most common symptoms associated with an upper respiratory infection are nasal discharge or congestion, cough, sneezing, headache and pressure in the ears and face.  These symptoms usually persist for about 3 to 10 days, but can last up to 2 weeks.  It is important to know that upper respiratory infections do not cause serious illness or complications in most cases.    Upper respiratory infections can be transmitted from person to person, with the most common method of transmission being a person's hands.  The virus is able to live on the skin and can infect other persons for up to 2 hours after direct contact.  Also, these can be transmitted when someone coughs or sneezes; thus, it is important to cover the mouth to reduce this risk.  To keep the spread of the illness at [place], good hand hygiene is very important.  This is an infection that is most likely caused by a virus. There are no specific treatments other than to help you with the symptoms until the infection runs its course.  We are sorry you are not feeling well.  Here is how we plan to help!   For nasal congestion, you may use an oral decongestants such as Mucinex D or if you have glaucoma or high blood pressure use plain Mucinex.  Saline nasal spray or nasal drops can help and can safely be used as often as  needed for congestion.  For your congestion, I have prescribed Fluticasone nasal spray one spray in each nostril twice a day  If you do not have a history of heart disease, hypertension, diabetes or thyroid disease, prostate/bladder issues or glaucoma, you may also use Sudafed to treat nasal congestion.  It is highly recommended that you consult with a pharmacist or your primary care physician to ensure this medication is safe for you to take.     If you have a cough, you may use cough suppressants such as Delsym and Robitussin.  If you have glaucoma or high blood pressure, you can also use Coricidin HBP.     If you have a sore or scratchy throat, use a saltwater gargle-  to  teaspoon of salt dissolved in a 4-ounce to 8-ounce glass of warm water.  Gargle the solution for approximately 15-30 seconds and then spit.  It is important not to swallow the solution.  You can also use throat lozenges/cough drops and Chloraseptic spray to help with throat pain or discomfort.  Warm or cold liquids can also be helpful in relieving throat pain.  For headache, pain or general discomfort, you can use Ibuprofen or Tylenol as directed.   Some authorities believe that zinc sprays or the use of Echinacea may shorten the course of your symptoms.   HOME CARE Only take medications as instructed by your medical  Be sure to drink plenty of fluids. Water is fine as well as fruit juices, sodas and electrolyte beverages. You may want to stay away from caffeine or alcohol. If you are nauseated, try taking small sips of liquids. How do you know if you are getting enough fluid? Your urine should be a pale yellow or almost colorless. Get rest. Taking a steamy shower or using a humidifier may help nasal congestion and ease sore throat pain. You can place a towel over your head and breathe in the steam from hot water coming from a faucet. Using a saline nasal spray works much the same way. Cough drops, hard candies and sore  throat lozenges may ease your cough. Avoid close contacts especially the very young and the elderly Cover your mouth if you cough or sneeze Always remember to wash your hands.   GET HELP RIGHT AWAY IF: You develop worsening fever. If your symptoms do not improve within 10 days You develop yellow or green discharge from your nose over 3 days. You have coughing fits You develop a severe head ache or visual changes. You develop shortness of breath, difficulty breathing or start having chest pain Your symptoms persist after you have completed your treatment plan  MAKE SURE YOU  Understand these instructions. Will watch your condition. Will get help right away if you are not doing well or get worse.  Thank you for choosing an e-visit.  Your e-visit answers were reviewed by a board certified advanced clinical practitioner to complete your personal care plan. Depending upon the condition, your plan could have included both over the counter or prescription medications.  Please review your pharmacy choice. Make sure the pharmacy is open so you can pick up prescription now. If there is a problem, you may contact your provider through MyChart messaging and have the prescription routed to another pharmacy.  Your safety is important to us. If you have drug allergies check your prescription carefully.   For the next 24 hours you can use MyChart to ask questions about today's visit, request a non-urgent call back, or ask for a work or school excuse. You will get an email in the next two days asking about your experience. I hope that your e-visit has been valuable and will speed your recovery.   Approximately 5 minutes was spent documenting and reviewing patient's chart.    

## 2021-06-17 ENCOUNTER — Telehealth: Payer: Medicaid Other | Admitting: Family

## 2021-06-17 DIAGNOSIS — J029 Acute pharyngitis, unspecified: Secondary | ICD-10-CM

## 2021-06-18 MED ORDER — AZITHROMYCIN 250 MG PO TABS: ORAL_TABLET | ORAL | 0 refills | Status: DC

## 2021-06-18 NOTE — Progress Notes (Signed)
E-Visit for Sore Throat - Strep Symptoms  We are sorry that you are not feeling well.  Here is how we plan to help!  Based on what you have shared with me it is likely that you have strep pharyngitis.  Strep pharyngitis is inflammation and infection in the back of the throat.  This is an infection cause by bacteria and is treated with antibiotics.  I have prescribed Azithromycin 250 mg two tablets today and then one daily for 4 additional days. For throat pain, we recommend over the counter oral pain relief medications such as acetaminophen or aspirin, or anti-inflammatory medications such as ibuprofen or naproxen sodium. Topical treatments such as oral throat lozenges or sprays may be used as needed. Strep infections are not as easily transmitted as other respiratory infections, however we still recommend that you avoid close contact with loved ones, especially the very young and elderly.  Remember to wash your hands thoroughly throughout the day as this is the number one way to prevent the spread of infection and wipe down door knobs and counters with disinfectant.   Home Care: Only take medications as instructed by your medical team. Complete the entire course of an antibiotic. Do not take these medications with alcohol. A steam or ultrasonic humidifier can help congestion.  You can place a towel over your head and breathe in the steam from hot water coming from a faucet. Avoid close contacts especially the very young and the elderly. Cover your mouth when you cough or sneeze. Always remember to wash your hands.  Get Help Right Away If: You develop worsening fever or sinus pain. You develop a severe head ache or visual changes. Your symptoms persist after you have completed your treatment plan.  Make sure you Understand these instructions. Will watch your condition. Will get help right away if you are not doing well or get worse.   Thank you for choosing an e-visit.  Your e-visit  answers were reviewed by a board certified advanced clinical practitioner to complete your personal care plan. Depending upon the condition, your plan could have included both over the counter or prescription medications.  Please review your pharmacy choice. Make sure the pharmacy is open so you can pick up prescription now. If there is a problem, you may contact your provider through MyChart messaging and have the prescription routed to another pharmacy.  Your safety is important to us. If you have drug allergies check your prescription carefully.   For the next 24 hours you can use MyChart to ask questions about today's visit, request a non-urgent call back, or ask for a work or school excuse. You will get an email in the next two days asking about your experience. I hope that your e-visit has been valuable and will speed your recovery.  Approximately 5 minutes was spent documenting and reviewing patient's chart.     

## 2021-06-22 ENCOUNTER — Other Ambulatory Visit: Payer: Self-pay

## 2021-06-22 ENCOUNTER — Other Ambulatory Visit (HOSPITAL_BASED_OUTPATIENT_CLINIC_OR_DEPARTMENT_OTHER): Payer: Self-pay

## 2021-06-22 ENCOUNTER — Encounter (HOSPITAL_BASED_OUTPATIENT_CLINIC_OR_DEPARTMENT_OTHER): Payer: Self-pay

## 2021-06-22 ENCOUNTER — Emergency Department (HOSPITAL_BASED_OUTPATIENT_CLINIC_OR_DEPARTMENT_OTHER)
Admission: EM | Admit: 2021-06-22 | Discharge: 2021-06-22 | Disposition: A | Discharge status: Home / Self Care | Payer: Medicaid Other | Attending: Emergency Medicine | Admitting: Emergency Medicine

## 2021-06-22 DIAGNOSIS — J029 Acute pharyngitis, unspecified: Secondary | ICD-10-CM | POA: Diagnosis present

## 2021-06-22 DIAGNOSIS — J36 Peritonsillar abscess: Secondary | ICD-10-CM | POA: Insufficient documentation

## 2021-06-22 MED ORDER — CLINDAMYCIN HCL 300 MG PO CAPS
300.0000 mg | ORAL_CAPSULE | Freq: Three times a day (TID) | ORAL | 0 refills | Status: AC
  Filled 2021-06-22: qty 21, 7d supply, fill #0

## 2021-06-22 MED ORDER — NAPROXEN 500 MG PO TABS
500.0000 mg | ORAL_TABLET | Freq: Two times a day (BID) | ORAL | 0 refills | Status: DC
  Filled 2021-06-22: qty 20, 10d supply, fill #0

## 2021-06-22 MED ORDER — DEXAMETHASONE 4 MG PO TABS
10.0000 mg | ORAL_TABLET | Freq: Once | ORAL | Status: AC
Start: 2021-06-22 — End: 2021-06-22
  Administered 2021-06-22: 10 mg via ORAL
  Filled 2021-06-22: qty 3

## 2021-06-22 NOTE — Discharge Instructions (Addendum)
It does not look like you have a full abscess yet at this time I think we have caught it early.  Start taking the antibiotic and you were given Decadron here for the pain and swelling.  You can also use the prescription strength naproxen as needed.  If you start feel like it is getting worse and not better you can return to the emergency room or call ENT where you have gone in the past. ?

## 2021-06-22 NOTE — ED Triage Notes (Signed)
Pt reports sorethroat past week, worse today, has hx peritonisillar abcesses, was diagnosed with strep one month ago. Symptoms resolved, but returned one week ago, took Z-pack, completed yesterday, right sided ear pain and pain down into neck.  Denies fevers ?

## 2021-06-22 NOTE — ED Provider Notes (Signed)
?[facility] ?Provider Note ? ? ?CSN: [id number] ?Arrival date & time: 06/22/21  1131 ? ?  ? ?History ? ?Chief Complaint  ?Patient presents with  ? Sore Throat  ? ? ?Leslie [name] is a 26 y.o. female. ? ?Patient is a 26 year old female with a history of recurrent strep throat and peritonsillar abscesses who is presenting today with complaints of sore throat.  Patient reports in mid February around Valentine's Day she had documented strep throat completed a course of clindamycin and improved.  However last week she started getting sore throat again and had a video visit.  They reported it was most likely strep and gave her a Z-Pak which she completed a few days ago.  She reports however yesterday she started having significant pain in the right side of her throat and feeling like it was swollen on the right side of her face.  She complains of severe pain with swallowing.  No breathing issues including shortness of breath, stridor.  She has had no new fever.  She denies nasal congestion or cough.  She has seen ENT several times for PTA drainage and reports they want to take her tonsils out but she has never had it done.  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 ? ?The history is provided by the patient.  ?Sore Throat ?This is a recurrent problem.  ? ?  ? ?Home Medications ?Prior to Admission medications   ?Medication Sig Start Date End Date Taking? Authorizing Provider  ?clindamycin (CLEOCIN) 300 MG capsule Take 1 capsule (300 mg total) by mouth 3 (three) times daily for 7 days. 06/22/21 06/29/21 Yes Blanchie Dessert, MD  ?naproxen (NAPROSYN) 500 MG tablet Take 1 tablet (500 mg total) by mouth 2 (two) times daily. 06/22/21  Yes Blanchie Dessert, MD  ?azithromycin (ZITHROMAX) 250 MG tablet Take 500 mg once, then 250 mg for four days 06/18/21    Sharion Balloon, FNP  ?fluticasone (FLONASE) 50 MCG/ACT nasal spray Place 2 sprays into both nostrils daily. 05/31/21   Sharion Balloon, FNP  ?guaiFENesin (MUCINEX) 600 MG 12 hr tablet Take 1 tablet (600 mg total) by mouth 2 (two) times daily. 02/06/21   Couture, Cortni S, PA-C  ?PHEXXI 1.8-1-0.4 % GEL Place vaginally. 10/21/20   [provider]  ?   ? ?Allergies    ?Amoxicillin   ? ?Review of Systems   ?Review of Systems ? ?Physical Exam ?Updated Vital Signs ?BP (!) 154/95 (BP Location: Left Arm)   Pulse 70   Temp 98 ?F (36.7 ?C) (Oral)   Resp 18   Ht '5\' 8"'$  (1.727 m)   Wt (!) 146.5 kg   SpO2 97%   BMI 49.11 kg/m?  ?Physical Exam ?Vitals and nursing note reviewed.  ?Constitutional:   ?   General: She is not in acute distress. ?   Appearance: She is well-developed.  ?HENT:  ?   Head: Normocephalic and atraumatic.  ?   Right Ear: Tympanic membrane normal. No mastoid tenderness.  ?   Left Ear: Tympanic membrane normal.  ?   Mouth/Throat:  ?   Mouth: Mucous membranes are moist.  ?   Tonsils: No tonsillar exudate. 1+ on the right. 1+ on the left.  ? ?Eyes:  ?   Pupils: Pupils are equal, round,  and reactive to light.  ?Cardiovascular:  ?   Rate and Rhythm: Normal rate and regular rhythm.  ?   Heart sounds: Normal heart sounds. No murmur heard. ?  No friction rub.  ?Pulmonary:  ?   Effort: Pulmonary effort is normal.  ?   Breath sounds: Normal breath sounds. No wheezing or rales.  ?Abdominal:  ?   General: Bowel sounds are normal. There is no distension.  ?   Palpations: Abdomen is soft.  ?   Tenderness: There is no abdominal tenderness. There is no guarding or rebound.  ?Musculoskeletal:     ?   General: No tenderness. Normal range of motion.  ?   Comments: No edema  ?Lymphadenopathy:  ?   Cervical: Cervical adenopathy present.  ?Skin: ?   General: Skin is warm and dry.  ?   Findings: No rash.  ?Neurological:  ?   Mental Status: She is alert and oriented to person, place, and time. Mental status is at  baseline.  ?   Cranial Nerves: No cranial nerve deficit.  ?Psychiatric:     ?   Mood and Affect: Mood normal.     ?   Behavior: Behavior normal.  ? ? ?ED Results / Procedures / Treatments   ?Labs ?(all labs ordered are listed, but only abnormal results are displayed) ?Labs Reviewed - No data to display ? ?EKG ?None ? ?Radiology ?No results found. ? ?Procedures ?Procedures  ? ? ?Medications Ordered in ED ?Medications  ?dexamethasone (DECADRON) tablet 10 mg (has no administration in time range)  ? ? ?ED Course/ Medical Decision Making/ A&P ?  ?                        ?Medical Decision Making ?Amount and/or Complexity of Data Reviewed ?External Data Reviewed: notes. ? ?Risk ?Prescription drug management. ? ? ?Patient presenting today with history of recurrent strep throat and peritonsillar abscesses.  She was treated for strep approximately 1 month ago and symptoms all resolved but started getting a sore throat last week completed a Z-Pak but now is having worsening pain and swelling on the right side.  She has significant pain with swallowing but denies any drooling.  She has no acute distress on exam has a Mallampati of 5 and some minimal swelling is noted to the right tonsillar pillar but no uvular shift or tonsillar exudates at this time.  External medical records from patient's prior visits for this were evaluated.  Feel that she may have an early developing peritonsillar abscess or phlegmon.  Given her history will treat with Decadron and clindamycin.  At this time low suspicion for otitis media, mastoiditis, epiglottitis, RPA.  Discussed with patient's reasons to return over the weekend.  Also given information to ENT she has seen in the past.  Patient is comfortable with this plan and has no further questions. ? ? ? ? ? ? ? ?Final Clinical Impression(s) / ED Diagnoses ?Final diagnoses:  ?Peritonsillar cellulitis  ? ? ?Rx / DC Orders ?ED Discharge Orders   ? ?      Ordered  ?  clindamycin (CLEOCIN) 300 MG  capsule  3 times daily       ? 06/22/21 1158  ?  naproxen (NAPROSYN) 500 MG tablet  2 times daily       ? 06/22/21 1200  ? ?  ?  ? ?  ? ? ?  ?Blanchie Dessert, MD ?06/22/21 1204 ? ?

## 2021-06-24 ENCOUNTER — Encounter (HOSPITAL_BASED_OUTPATIENT_CLINIC_OR_DEPARTMENT_OTHER): Payer: Self-pay | Admitting: Emergency Medicine

## 2021-06-24 ENCOUNTER — Emergency Department (HOSPITAL_BASED_OUTPATIENT_CLINIC_OR_DEPARTMENT_OTHER): Payer: Medicaid Other

## 2021-06-24 ENCOUNTER — Other Ambulatory Visit: Payer: Self-pay

## 2021-06-24 ENCOUNTER — Emergency Department (HOSPITAL_BASED_OUTPATIENT_CLINIC_OR_DEPARTMENT_OTHER)
Admission: EM | Admit: 2021-06-24 | Discharge: 2021-06-24 | Disposition: A | Discharge status: Home / Self Care | Payer: Medicaid Other | Attending: Emergency Medicine | Admitting: Emergency Medicine

## 2021-06-24 DIAGNOSIS — J029 Acute pharyngitis, unspecified: Secondary | ICD-10-CM | POA: Diagnosis present

## 2021-06-24 DIAGNOSIS — Z20822 Contact with and (suspected) exposure to covid-19: Secondary | ICD-10-CM | POA: Insufficient documentation

## 2021-06-24 DIAGNOSIS — D72829 Elevated white blood cell count, unspecified: Secondary | ICD-10-CM | POA: Diagnosis not present

## 2021-06-24 DIAGNOSIS — J039 Acute tonsillitis, unspecified: Secondary | ICD-10-CM | POA: Diagnosis not present

## 2021-06-24 DIAGNOSIS — J36 Peritonsillar abscess: Secondary | ICD-10-CM

## 2021-06-24 LAB — RESP PANEL BY RT-PCR (FLU A&B, COVID) ARPGX2
Influenza A by PCR: NEGATIVE
Influenza B by PCR: NEGATIVE
SARS Coronavirus 2 by RT PCR: NEGATIVE

## 2021-06-24 LAB — BASIC METABOLIC PANEL
Anion gap: 7 (ref 5–15)
BUN: 21 mg/dL — ABNORMAL HIGH (ref 6–20)
CO2: 24 mmol/L (ref 22–32)
Calcium: 9 mg/dL (ref 8.9–10.3)
Chloride: 104 mmol/L (ref 98–111)
Creatinine, Ser: 0.81 mg/dL (ref 0.44–1.00)
GFR, Estimated: >60 mL/min (ref >60)
Glucose, Bld: 114 mg/dL — ABNORMAL HIGH (ref 70–99)
Potassium: 3.9 mmol/L (ref 3.5–5.1)
Sodium: 135 mmol/L (ref 135–145)

## 2021-06-24 LAB — CBC WITH DIFFERENTIAL/PLATELET
Abs Immature Granulocytes: 0.02 10*3/uL (ref 0.00–0.07)
Basophils Absolute: 0 10*3/uL (ref 0.0–0.1)
Basophils Relative: 0 %
Eosinophils Absolute: 0 10*3/uL (ref 0.0–0.5)
Eosinophils Relative: 0 %
HCT: 39.1 % (ref 36.0–46.0)
Hemoglobin: 12.5 g/dL (ref 12.0–15.0)
Immature Granulocytes: 0 %
Lymphocytes Relative: 26 %
Lymphs Abs: 2.8 10*3/uL (ref 0.7–4.0)
MCH: 26.7 pg (ref 26.0–34.0)
MCHC: 32 g/dL (ref 30.0–36.0)
MCV: 83.5 fL (ref 80.0–100.0)
Monocytes Absolute: 0.7 10*3/uL (ref 0.1–1.0)
Monocytes Relative: 6 %
Neutro Abs: 7.3 10*3/uL (ref 1.7–7.7)
Neutrophils Relative %: 68 %
Platelets: 355 10*3/uL (ref 150–400)
RBC: 4.68 MIL/uL (ref 3.87–5.11)
RDW: 15.3 % (ref 11.5–15.5)
WBC: 10.8 10*3/uL — ABNORMAL HIGH (ref 4.0–10.5)
nRBC: 0 % (ref 0.0–0.2)

## 2021-06-24 LAB — HCG, QUANTITATIVE, PREGNANCY: hCG, Beta Chain, Quant, S: <1 m[IU]/mL (ref <5)

## 2021-06-24 MED ORDER — LACTATED RINGERS IV BOLUS
1000.0000 mL | Freq: Once | INTRAVENOUS | Status: AC
  Administered 2021-06-24: 1000 mL via INTRAVENOUS

## 2021-06-24 MED ORDER — OXYCODONE-ACETAMINOPHEN 5-325 MG PO TABS
1.0000 | ORAL_TABLET | Freq: Once | ORAL | Status: AC
  Administered 2021-06-24: 1 via ORAL
  Filled 2021-06-24: qty 1

## 2021-06-24 MED ORDER — NAPROXEN 500 MG PO TABS
500.0000 mg | ORAL_TABLET | Freq: Two times a day (BID) | ORAL | 0 refills | Status: AC
Start: 2021-06-24 — End: 2021-07-01

## 2021-06-24 MED ORDER — OXYCODONE-ACETAMINOPHEN 5-325 MG PO TABS: 1.0000 | ORAL_TABLET | Freq: Four times a day (QID) | ORAL | 0 refills | Status: AC | PRN

## 2021-06-24 MED ORDER — ONDANSETRON HCL 4 MG/2ML IJ SOLN
4.0000 mg | Freq: Once | INTRAMUSCULAR | Status: AC
  Administered 2021-06-24: 4 mg via INTRAVENOUS
  Filled 2021-06-24: qty 2

## 2021-06-24 MED ORDER — FENTANYL CITRATE PF 50 MCG/ML IJ SOSY
100.0000 ug | PREFILLED_SYRINGE | Freq: Once | INTRAMUSCULAR | Status: AC
  Administered 2021-06-24: 100 ug via INTRAVENOUS
  Filled 2021-06-24: qty 2

## 2021-06-24 MED ORDER — CLINDAMYCIN PHOSPHATE 600 MG/50ML IV SOLN
600.0000 mg | Freq: Once | INTRAVENOUS | Status: AC
  Administered 2021-06-24: 600 mg via INTRAVENOUS
  Filled 2021-06-24: qty 50

## 2021-06-24 MED ORDER — IOHEXOL 300 MG/ML  SOLN
75.0000 mL | Freq: Once | INTRAMUSCULAR | Status: AC | PRN
  Administered 2021-06-24: 75 mL via INTRAVENOUS

## 2021-06-24 MED ORDER — DEXAMETHASONE 1 MG/ML PO CONC
10.0000 mg | Freq: Once | ORAL | Status: AC
  Administered 2021-06-24: 10 mg via ORAL
  Filled 2021-06-24: qty 10

## 2021-06-24 MED ORDER — FENTANYL CITRATE PF 50 MCG/ML IJ SOSY
50.0000 ug | PREFILLED_SYRINGE | Freq: Once | INTRAMUSCULAR | Status: AC
  Administered 2021-06-24: 50 ug via INTRAVENOUS
  Filled 2021-06-24: qty 1

## 2021-06-24 NOTE — ED Notes (Signed)
Patient arrives from [facility] ED, NAD, VSS.  Tonsils swollen.   ?

## 2021-06-24 NOTE — ED Notes (Signed)
Patient transported to CT 

## 2021-06-24 NOTE — ED Triage Notes (Signed)
Patient arrived via POV c/o sore throat with difficulty swallowing. Seen here on 3/16 for same. Patient told to return if worse. Patient states some difficulty swallowing at this time. Patient is AO x 4, VS WDL, normal gait. ?

## 2021-06-24 NOTE — ED Provider Notes (Signed)
?[facility] EM[facility] DEPARTMENT ?Provider Note ? ? ?CSN: [id number] ?Arrival date & time: 06/24/21  0001 ? ?  ? ?History ? ?Chief Complaint  ?Patient presents with  ? Sore Throat  ? Dysphagia  ? ? ?Leslie [name] is a 26 y.o. female. ? ?The history is provided by the patient.  ?Sore Throat ?This is a recurrent problem. The current episode started more than 2 days ago. The problem occurs daily. The problem has been gradually worsening. The symptoms are aggravated by swallowing. The symptoms are relieved by rest.  ?Patient with a history of recurrent peritonsillar abscess presents with increasing sore throat.  Patient reports last week she began having a sore throat and was placed on a Z-Pak.  She was then seen in the ER on March 16 and felt that her symptoms were worsening and was placed on clindamycin.  Over the past day she had increasing pain mostly in the right tonsil and right ear.  She is concerned she may have another abscess ? ?  ? ?Home Medications ?Prior to Admission medications   ?Medication Sig Start Date End Date Taking? Authorizing Provider  ?azithromycin (ZITHROMAX) 250 MG tablet Take 500 mg once, then 250 mg for four days 06/18/21   Sharion Balloon, FNP  ?clindamycin (CLEOCIN) 300 MG capsule Take 1 capsule (300 mg total) by mouth 3 (three) times daily for 7 days. 06/22/21 06/29/21  Blanchie Dessert, MD  ?fluticasone (FLONASE) 50 MCG/ACT nasal spray Place 2 sprays into both nostrils daily. 05/31/21   Sharion Balloon, FNP  ?guaiFENesin (MUCINEX) 600 MG 12 hr tablet Take 1 tablet (600 mg total) by mouth 2 (two) times daily. 02/06/21   Couture, Cortni S, PA-C  ?naproxen (NAPROSYN) 500 MG tablet Take 1 tablet (500 mg total) by mouth 2 (two) times daily. 06/22/21   Blanchie Dessert, MD  ?PHEXXI 1.8-1-0.4 % GEL Place vaginally. 10/21/20   [provider]  ?   ? ?Allergies    ?Amoxicillin   ? ?Review of Systems   ?Review of Systems  ?Constitutional:  Negative for fever.  ?HENT:  Positive for  ear pain, sore throat and trouble swallowing. Negative for drooling.   ?Respiratory:  Negative for cough.   ?Gastrointestinal:  Negative for vomiting.  ? ?Physical Exam ?Updated Vital Signs ?BP 123/78   Pulse 65   Temp 98.3 ?F (36.8 ?C)   Resp 16   Ht 1.727 m ('5\' 8"'$ )   Wt (!) 145.5 kg   LMP 06/23/2021 (Exact Date)   SpO2 100%   BMI 48.77 kg/m?  ?Physical Exam ?CONSTITUTIONAL: Well developed/well nourished ?HEAD: Normocephalic/atraumatic ?EYES: EOMI/PERRL ?ENMT: Mucous membranes moist, uvula mildly deviated to the left.  Both tonsils are enlarged, right greater than left.  No stridor, no drooling.  No dysphonia. ?Bilateral TMs clear and intact ?NECK: supple no meningeal signs, no anterior neck edema.  No cervical lymphadenopathy ?CV: S1/S2 noted, no murmurs/rubs/gallops noted ?LUNGS: Lungs are clear to auscultation bilaterally, no apparent distress ?ABDOMEN: soft ?NEURO: Pt is awake/alert/appropriate, moves all extremitiesx4.  No facial droop.   ?EXTREMITIES: pulses normal/equal, full ROM ?SKIN: warm, color normal ?PSYCH: no abnormalities of mood noted, alert and oriented to situation ? ?ED Results / Procedures / Treatments   ?Labs ?(all labs ordered are listed, but only abnormal results are displayed) ?Labs Reviewed  ?BASIC METABOLIC PANEL - Abnormal; Notable for the following components:  ?    Result Value  ? Glucose, Bld 114 (*)   ? BUN 21 (*)   ?  All other components within normal limits  ?CBC WITH DIFFERENTIAL/PLATELET - Abnormal; Notable for the following components:  ? WBC 10.8 (*)   ? All other components within normal limits  ?HCG, QUANTITATIVE, PREGNANCY  ? ? ?EKG ?None ? ?Radiology ?CT Soft Tissue Neck W Contrast ? ?Result Date: 06/24/2021 ?CLINICAL DATA:  26 year old female with sore throat and dysphagia. History of tonsillitis, right para tonsillar abscess in 2019. EXAM: CT NECK WITH CONTRAST TECHNIQUE: Multidetector CT imaging of the neck was performed using the standard protocol following the  bolus administration of intravenous contrast. RADIATION DOSE REDUCTION: This exam was performed according to the departmental dose-optimization program which includes automated exposure control, adjustment of the mA and/or kV according to patient size and/or use of iterative reconstruction technique. CONTRAST:  83m OMNIPAQUE IOHEXOL 300 MG/ML  SOLN COMPARISON:  Neck CT 05/22/2021 and earlier. FINDINGS: Pharynx and larynx: Larynx and epiglottis are stable since last month and within normal limits. New asymmetric enlargement and heterogeneity of the right palatine tonsil (series 3, image 37) with an ill-defined oval hypodense area within that tonsil 18 by 10 x 14 mm (AP by transverse by CC) consistent with developing tonsillar abscess. Only subtle inflammatory stranding in the right parapharyngeal space at this time. Left parapharyngeal and retropharyngeal spaces remain negative. Superimposed generalized tonsillar and adenoid hypertrophy otherwise similar to last month. Salivary glands: Stable and negative. Thyroid: Stable and negative. Lymph nodes: Increased, stable bilateral cervical lymph nodes, bilateral level 2A nodes are chronically at the upper limits of normal to mildly enlarged (series 3 images 39 and 43). No cystic or necrotic nodes. Vascular: Suboptimal intravascular contrast bolus but the major vascular structures in the neck including the right IJ appear to remain patent. There is a partially retropharyngeal course of the right carotid artery (series 3, image 51). Limited intracranial: Negative. Visualized orbits: Negative. Mastoids and visualized paranasal sinuses: Visualized paranasal sinuses and mastoids are stable and well aerated. Tympanic cavities are clear. Skeleton: No acute dental finding. 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 Negative cervical spine. No acute osseous abnormality identified. Upper chest: Negative. IMPRESSION: 1. Positive for Tonsillitis with  ill-defined developing Right Tonsillar Abscess measuring up to 1.8 cm (series 3, image 37). Note a partially retropharyngeal course of the right carotid. Minimal inflammation in the right parapharyngeal space. Underlying chronic generalized tonsillar and adenoid enlargement. 2. Reactive appearing bilateral cervical lymph nodes are unchanged from last month. 3. 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 Electronically Signed   By: HGenevie AnnM.D.   On: 06/24/2021 04:29   ? ?Procedures ?Procedures  ? ? ?Medications Ordered in ED ?Medications  ?lactated ringers bolus 1,000 mL (has no administration in time range)  ?fentaNYL (SUBLIMAZE) injection 100 mcg (has no administration in time range)  ?clindamycin (CLEOCIN) IVPB 600 mg (has no administration in time range)  ?fentaNYL (SUBLIMAZE) injection 50 mcg (50 mcg Intravenous Given 06/24/21 0210)  ?fentaNYL (SUBLIMAZE) injection 50 mcg (50 mcg Intravenous Given 06/24/21 0342)  ?iohexol (OMNIPAQUE) 300 MG/ML solution 75 mL (75 mLs Intravenous Contrast Given 06/24/21 0354)  ? ? ?ED Course/ Medical Decision Making/ A&P ?Clinical Course as of 06/24/21 0452  ?Sat Jun 24, 2021  ?0215 WBC(!): 10.8 ?Leukocytosis [DW]  ?0215 Patient with long history of previous PTA presents with increasing sore throat.  She has already been on clindamycin.  She received Decadron on March 16 in the emergency department.  She will require repeat CT imaging to rule out additional PTA [DW]  ?  0436 CT results viewed, pt with PTA, will consult ENT ? [DW]  ?0442 Discussed CT imaging with Dr. Janace Hoard.  These findings would not mandate immediate treatment.  Patient can either continue antibiotics for additional 1-2 days, or be seen in the emergency department at [facility]. [DW]  ?0449 After discussion with patient, she will be transferred to [facility] emergency department to be evaluated by otolaryngology [DW]  ?0450 Accepted by Dr Ralene Bathe at [facility]  ER [DW]  ?  ?Clinical Course User Index ?[DW] Ripley Fraise, MD  ? ?                        ?Medical Decision Making ?Amount and/or Complexity of Data Reviewed ?Labs: ordered. Decision-making details documented in ED

## 2021-06-24 NOTE — Discharge Instructions (Signed)
Your CT revealed a 1.7 cm peritonsillar abscess. Dr. Wilburn Cornelia was consulted today and recommend medication management at this time with oral Decadron which we gave you here, and continuation of your Clindamycin 300 mg three times a day. I have also prescribed you Naproxen to take twice daily, and Percocet to take as needed for breakthrough pain. Please do not take Percocet prior to driving or operating heavy machinery as it can make you drowsy and disoriented. Please do not take more than prescribed as it can cause you to stop breathing if taken in excess quantities.  ? ?You should call Dr. Victorio Palm office on Monday and schedule an appointment to be seen next week for follow up.  ?Please return to the ED if your symptoms worsen. ?

## 2021-06-24 NOTE — ED Provider Notes (Signed)
This patient was transferred from [facility].  There is she is cared for by Dr. Christy Gentles.  I reviewed his note. ?She is being worked up for a peritonsillar abscess. ? ?Patient was originally seen on February 14 by our emergency department.  She had a sore throat that began on the 13th.  On her initial visit, she tested positive for strep throat.  She had a normal soft tissue neck CT at that time.  He was started on clindamycin for 10-day course.  According to patient, symptoms started to improve with this treatment.  She states that about around March 9, her sore throat returned.  She had a video visit where they said it was most likely strep recurrence and gave her azithromycin which she completed.  She returned again to our emergency department on the 16th.  At that point, she was given another course of clinda and a dose of Decadron for suspected early peritonsillar abscess.  She was told to return if her symptoms worsen.  Patient states that her symptoms have continued to worsen on antibiotic therapy.  She presented to [place] overnight around 2 AM.  She states that over the past 2 days, she started to have some right-sided neck swelling, and she states that the right side of her face hurts and it radiates up to her right ear.  She denies any fevers or chills.  She denies any difficulty breathing.  She is having trouble swallowing and taking p.o. intake.  CT was obtained which revealed 1.8 cm right peritonsillar abscess.  ENT was consulted and she was given the choice to finish out her clindamycin treatment or be seen by ENT and the [facility] emergency department.  She elected to get transferred to the [facility] ED with the plan of reconsulting ENT to see this patient. ? ? ? ? ?Physical Exam  ?BP (!) 146/104 (BP Location: Right Arm)   Pulse 70   Temp 98.4 ?F (36.9 ?C) (Oral)   Resp 16   Ht '5\' 8"'$  (1.727 m)   Wt (!) 145.5 kg   LMP 06/23/2021 (Exact Date)   SpO2 99%   BMI 48.77  kg/m?  ? ?Physical Exam ?Vitals and nursing note reviewed.  ?Constitutional:   ?   General: She is not in acute distress. ?   Appearance: Normal appearance. She is well-developed. She is not ill-appearing, toxic-appearing or diaphoretic.  ?HENT:  ?   Head: Normocephalic and atraumatic.  ?   Nose: No nasal deformity, congestion or rhinorrhea.  ?   Mouth/Throat:  ?   Lips: Pink. No lesions.  ?   Mouth: Mucous membranes are moist.  ?   Pharynx: No oropharyngeal exudate.  ?   Tonsils: Tonsillar abscess present. No tonsillar exudate. 2+ on the right.  ?   Comments: Slight left uvular deviation. No obvious exudates, but there may be a possible right PTA present. Difficult to see on exam. Mild trismus. There is also right sided anterior lymphadenopathy ?Eyes:  ?   General: Gaze aligned appropriately. No scleral icterus.    ?   Right eye: No discharge.     ?   Left eye: No discharge.  ?   Conjunctiva/sclera: Conjunctivae normal.  ?   Right eye: Right conjunctiva is not injected. No exudate or hemorrhage. ?   Left eye: Left conjunctiva is not injected. No exudate or hemorrhage. ?Pulmonary:  ?   Effort: Pulmonary effort is normal. No respiratory distress.  ?  Skin: ?   General: Skin is warm and dry.  ?Neurological:  ?   Mental Status: She is alert and oriented to person, place, and time.  ?Psychiatric:     ?   Mood and Affect: Mood normal.     ?   Speech: Speech normal.     ?   Behavior: Behavior normal. Behavior is cooperative.  ? ? ?Procedures  ?Procedures ? ?ED Course / MDM  ? ?Clinical Course as of 06/24/21 0904  ?Sat Jun 24, 2021  ?0215 WBC(!): 10.8 ?Leukocytosis [DW]  ?0215 Patient with long history of previous PTA presents with increasing sore throat.  She has already been on clindamycin.  She received Decadron on March 16 in the emergency department.  She will require repeat CT imaging to rule out additional PTA [DW]  ?0436 CT results viewed, pt with PTA, will consult ENT ? [DW]  ?0442 Discussed CT imaging with Dr.  Janace Hoard.  These findings would not mandate immediate treatment.  Patient can either continue antibiotics for additional 1-2 days, or be seen in the emergency department at [facility]. [DW]  ?0449 After discussion with patient, she will be transferred to [facility] emergency department to be evaluated by otolaryngology [DW]  ?0450 Accepted by Dr Ralene Bathe at [facility] ER [DW]  ?[place] with Dr. Wilburn Cornelia. Abscess is too small to drain at this point. Recommends Clindamycin 300 mg TID completion. 10 mg Decadron. Pain medication. F/u outpatient for consideration of tonsil removal.  [GL]  ?  ?Clinical Course User Index ?[DW] Ripley Fraise, MD ?[GL] Sherre Poot Adora Fridge, PA-C  ? ?Medical Decision Making ?Amount and/or Complexity of Data Reviewed ?Labs: ordered. Decision-making details documented in ED Course. ?Radiology: ordered. ? ?Risk ?Prescription drug management. ? ? ?I am seeing this patient following transfer from [place] for evaluation of a peritonsillar abscess.  Most of her work-up and evaluation was done by the prior provider.  On my assessment, patient has right-sided lymphadenopathy, evidence of a small right-sided peritonsillar abscess on exam.  I reviewed the CT done at the prior hospital and it showed an ill-defined developing right PTA measuring up to 1.8 cm.  There is a partially retropharyngeal course of the right carotid with minimal inflammation in the right parapharyngeal space.  There is underlying chronic generalized tonsillar and adenoid enlargement.  Reactive bilateral cervical lymph nodes are unchanged from recent CT 1 month ago.  Also chronic findings of elongated bilateral stylohyoid ligament.  It looks like she received an IV dose of clindamycin at the outside hospital as well as multiple doses of pain medication and fluids. ?Dr. Christy Gentles who is the original MD taking care of this patient at [facility] consulted Dr. Janace Hoard who is on-call last night for ENT.  At that point, it was  recommended that patient be discharged home with supportive treatment but if she wanted to come to [facility], he could evaluate her in person.  She elected to go this route.  Unfortunately, by the time the patient got here and was assessed by me, Dr. Janace Hoard is no longer on-call.  [facility] who is her primary ENT doctor outpatient was consulted.  I spoke with Dr. Wilburn Cornelia.  He says the abscess is too small to drain at this point.  He recommends continuing her clindamycin 300 mg 3 times a day and complete the course.  He recommends an additional dose of 10 mg of Decadron as well as pain medication at discharge.  Patient  will need to follow-up in the office next week and she should call on Monday for this appointment.  He says ideally, patient would benefit from tonsillectomy that could be done this month if needed. ? ?I discussed with the patient regarding the plan.  She is disappointed that she was transferred here with the impression that she would get her abscess drained.  She is however agreeable to the plan of discharge, however she is concerned that she will end up right back in the emergency department.  I will give patient a dose of Decadron here and p.o. challenge prior to discharge. ? ?She is able to successfully perform p.o. challenge and take oral pain meds.  I feel that she is stable for discharge at this point.  She is given return precautions.  She needs to follow-up with ENT next week. ? ? ? ? ?  ?Adolphus Birchwood, PA-C ?06/24/21 9030 ? ?  ?Tegeler, Gwenyth Allegra, MD ?06/24/21 1211 ? ?

## 2021-06-25 ENCOUNTER — Other Ambulatory Visit: Payer: Self-pay

## 2021-06-25 ENCOUNTER — Encounter (HOSPITAL_BASED_OUTPATIENT_CLINIC_OR_DEPARTMENT_OTHER): Payer: Self-pay | Admitting: Emergency Medicine

## 2021-06-25 ENCOUNTER — Emergency Department (HOSPITAL_BASED_OUTPATIENT_CLINIC_OR_DEPARTMENT_OTHER)
Admission: EM | Admit: 2021-06-25 | Discharge: 2021-06-25 | Disposition: A | Discharge status: Home / Self Care | Payer: Medicaid Other | Attending: Emergency Medicine | Admitting: Emergency Medicine

## 2021-06-25 DIAGNOSIS — J36 Peritonsillar abscess: Secondary | ICD-10-CM | POA: Diagnosis not present

## 2021-06-25 DIAGNOSIS — J029 Acute pharyngitis, unspecified: Secondary | ICD-10-CM | POA: Diagnosis present

## 2021-06-25 MED ORDER — KETOROLAC TROMETHAMINE 30 MG/ML IJ SOLN
30.0000 mg | Freq: Once | INTRAMUSCULAR | Status: AC
  Administered 2021-06-25: 30 mg via INTRAVENOUS
  Filled 2021-06-25: qty 1

## 2021-06-25 MED ORDER — DEXAMETHASONE SODIUM PHOSPHATE 10 MG/ML IJ SOLN: 10.0000 mg | Freq: Once | INTRAMUSCULAR | Status: DC

## 2021-06-25 MED ORDER — CLINDAMYCIN PHOSPHATE 600 MG/50ML IV SOLN
600.0000 mg | Freq: Once | INTRAVENOUS | Status: AC
  Administered 2021-06-25: 600 mg via INTRAVENOUS
  Filled 2021-06-25: qty 50

## 2021-06-25 MED ORDER — DEXAMETHASONE SODIUM PHOSPHATE 10 MG/ML IJ SOLN
10.0000 mg | Freq: Once | INTRAMUSCULAR | Status: AC
  Administered 2021-06-25: 10 mg via INTRAVENOUS
  Filled 2021-06-25: qty 1

## 2021-06-25 MED ORDER — KETOROLAC TROMETHAMINE 60 MG/2ML IM SOLN: 60.0000 mg | Freq: Once | INTRAMUSCULAR | Status: DC

## 2021-06-25 MED ORDER — SODIUM CHLORIDE 0.9 % IV BOLUS
1000.0000 mL | Freq: Once | INTRAVENOUS | Status: AC
  Administered 2021-06-25: 1000 mL via INTRAVENOUS

## 2021-06-25 NOTE — ED Triage Notes (Signed)
Pt returns for worsening sore throat/swelling; sts she is having trouble swallowing and breathing; she was transferred from here to [facility] yesterday for ENT to drain abscess, but she says they told her they wanted to wait and see her in the office this week; sts she cannot wait that long ?

## 2021-06-25 NOTE — ED Notes (Signed)
ED Provider at bedside. Alroy Bailiff, EDPA ?

## 2021-06-25 NOTE — ED Notes (Signed)
ED Provider at bedside. Dr. Nanavati 

## 2021-06-25 NOTE — Discharge Instructions (Signed)
Follow up with Dr. Wilburn Cornelia tomorrow morning. Call the office tomorrow at 9 AM to be seen.  ? ?Continue taking your antibiotics as prescribed. Continue drinking plenty of fluids to stay hydrated. Make sure you are alternating Ibuprofen and Tylenol to stay ontop of pain.  ?

## 2021-06-25 NOTE — ED Provider Notes (Signed)
?[facility] EMERGENCY DEPARTMENT ?Provider Note ? ? ?CSN: [id number] ?Arrival date & time: 06/25/21  2019 ? ?  ? ?History ? ?Chief Complaint  ?Patient presents with  ? Sore Throat  ? ? ?Leslie [name] is a 26 y.o. female who presents to the ED today with complaint of worsening sore throat and swelling.  Per chart review patient has had multiple ED visits for sore throat.  She was originally seen on 02/14.  She tested positive for strep throat at that time.  She had a CT scan of her neck at that time which was negative and was discharged home on 10-day course of clindamycin.  Sore throat had gotten better however returned on 03/09.  She had a virtual visit at that time and was given azithromycin.  She returned to the ED on 03/16 and at that point was given another round of clinda and a dose of Decadron for suspected early PTA.  She was advised to return if her symptoms worsen.  She returned to the ED yesterday for worsening pain and had a CT scan done which did show a 1.8 cm right peritonsillar abscess.  ENT was consulted at that time and patient was given the choice whether or not to finish her clindamycin treatment or be seen by ENT at [facility], ED.  She chose to be transferred electively to [facility].  At that point another ENT was on-call, reconsulted however reported that the abscess was too small to be drained and advised that she follow-up in the outpatient setting and continue her clindamycin course.  Patient states that she has 3 days left on her clindamycin course however her pain has become unbearable.  She states that she has been taking medications for pain and was prescribed oxycodone during her last visit however states that she is having difficulty at nighttime with laying flat.  She states that she has severe pain with swallowing and does not feel like she can wait until this week to be seen by ENT.  ? ?The history is provided by the patient and medical records.  ? ?  ? ?Home  Medications ?Prior to Admission medications   ?Medication Sig Start Date End Date Taking? Authorizing Provider  ?clindamycin (CLEOCIN) 300 MG capsule Take 1 capsule (300 mg total) by mouth 3 (three) times daily for 7 days. ?Patient taking differently: Take 300 mg by mouth See admin instructions. Tid x 7 days 06/22/21 06/29/21  Blanchie Dessert, MD  ?fluticasone (FLONASE) 50 MCG/ACT nasal spray Place 2 sprays into both nostrils daily. ?Patient not taking: Reported on 06/24/2021 05/31/21   Sharion Balloon, FNP  ?naproxen (NAPROSYN) 500 MG tablet Take 1 tablet (500 mg total) by mouth 2 (two) times daily for 7 days. 06/24/21 07/01/21  Loeffler, Adora Fridge, PA-C  ?naproxen sodium (ALEVE) 220 MG tablet Take 880 mg by mouth daily as needed (pain).    [provider]  ?oxyCODONE-acetaminophen (PERCOCET/ROXICET) 5-325 MG tablet Take 1 tablet by mouth every 6 (six) hours as needed for up to 5 days for severe pain. 06/24/21 06/29/21  Loeffler, Adora Fridge, PA-C  ?   ? ?Allergies    ?Amoxicillin   ? ?Review of Systems   ?Review of Systems  ?Constitutional:  Negative for fever.  ?HENT:  Positive for sore throat, trouble swallowing and voice change. Negative for drooling.   ?All other systems reviewed and are negative. ? ?Physical Exam ?Updated Vital Signs ?BP 121/75 (BP Location: Left Arm)   Pulse 70  Temp 98.2 ?F (36.8 ?C) (Oral)   Resp 16   LMP 06/23/2021 (Exact Date)   SpO2 100%  ?Physical Exam ?Vitals and nursing note reviewed.  ?Constitutional:   ?   Appearance: She is not ill-appearing.  ?HENT:  ?   Head: Normocephalic and atraumatic.  ?   Mouth/Throat:  ?   Tonsils: 3+ on the right.  ?   Comments: Muffled hot potato voice however tolerating secretions. Large peritonsillar abscess to right side on exam.  ?Eyes:  ?   Conjunctiva/sclera: Conjunctivae normal.  ?Cardiovascular:  ?   Rate and Rhythm: Normal rate and regular rhythm.  ?Pulmonary:  ?   Effort: Pulmonary effort is normal.  ?   Breath sounds: Normal breath sounds.   ?Abdominal:  ?   Palpations: Abdomen is soft.  ?   Tenderness: There is no abdominal tenderness.  ?Musculoskeletal:  ?   Cervical back: Neck supple.  ?Skin: ?   General: Skin is warm and dry.  ?Neurological:  ?   Mental Status: She is alert.  ? ? ?ED Results / Procedures / Treatments   ?Labs ?(all labs ordered are listed, but only abnormal results are displayed) ?Labs Reviewed - No data to display ? ?EKG ?None ? ?Radiology ?CT Soft Tissue Neck W Contrast ? ?Result Date: 06/24/2021 ?CLINICAL DATA:  26 year old female with sore throat and dysphagia. History of tonsillitis, right para tonsillar abscess in 2019. EXAM: CT NECK WITH CONTRAST TECHNIQUE: Multidetector CT imaging of the neck was performed using the standard protocol following the bolus administration of intravenous contrast. RADIATION DOSE REDUCTION: This exam was performed according to the departmental dose-optimization program which includes automated exposure control, adjustment of the mA and/or kV according to patient size and/or use of iterative reconstruction technique. CONTRAST:  4m OMNIPAQUE IOHEXOL 300 MG/ML  SOLN COMPARISON:  Neck CT 05/22/2021 and earlier. FINDINGS: Pharynx and larynx: Larynx and epiglottis are stable since last month and within normal limits. New asymmetric enlargement and heterogeneity of the right palatine tonsil (series 3, image 37) with an ill-defined oval hypodense area within that tonsil 18 by 10 x 14 mm (AP by transverse by CC) consistent with developing tonsillar abscess. Only subtle inflammatory stranding in the right parapharyngeal space at this time. Left parapharyngeal and retropharyngeal spaces remain negative. Superimposed generalized tonsillar and adenoid hypertrophy otherwise similar to last month. Salivary glands: Stable and negative. Thyroid: Stable and negative. Lymph nodes: Increased, stable bilateral cervical lymph nodes, bilateral level 2A nodes are chronically at the upper limits of normal to mildly  enlarged (series 3 images 39 and 43). No cystic or necrotic nodes. Vascular: Suboptimal intravascular contrast bolus but the major vascular structures in the neck including the right IJ appear to remain patent. There is a partially retropharyngeal course of the right carotid artery (series 3, image 51). Limited intracranial: Negative. Visualized orbits: Negative. Mastoids and visualized paranasal sinuses: Visualized paranasal sinuses and mastoids are stable and well aerated. Tympanic cavities are clear. Skeleton: No acute dental finding. 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 Negative cervical spine. No acute osseous abnormality identified. Upper chest: Negative. IMPRESSION: 1. Positive for Tonsillitis with ill-defined developing Right Tonsillar Abscess measuring up to 1.8 cm (series 3, image 37). Note a partially retropharyngeal course of the right carotid. Minimal inflammation in the right parapharyngeal space. Underlying chronic generalized tonsillar and adenoid enlargement. 2. Reactive appearing bilateral cervical lymph nodes are unchanged from last month. 3. 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 Electronically  Signed   By: Genevie Ann M.D.   On: 06/24/2021 04:29   ? ?Procedures ?Procedures  ? ? ?Medications Ordered in ED ?Medications  ?sodium chloride 0.9 % bolus 1,000 mL ( Intravenous Stopped 06/25/21 2314)  ?clindamycin (CLEOCIN) IVPB 600 mg (0 mg Intravenous Stopped 06/25/21 2249)  ?dexamethasone (DECADRON) injection 10 mg (10 mg Intravenous Given 06/25/21 2227)  ?ketorolac (TORADOL) 30 MG/ML injection 30 mg (30 mg Intravenous Given 06/25/21 2218)  ? ? ?ED Course/ Medical Decision Making/ A&P ?  ?                        ?Medical Decision Making ?26 year old female who presents to the ED today with continued, worsening, throat swelling/pain.  Diagnosis of peritonsillar abscess yesterday on CT scan.  Is currently on  clindamycin.  Was transferred to [facility] electively yesterday however ENT consulted and stated that the abscess was too small to be drained.  On arrival to the ED today vitals are stable.  Patient is afebrile, nontachycardic a

## 2021-12-01 ENCOUNTER — Other Ambulatory Visit (HOSPITAL_COMMUNITY): Payer: Self-pay

## 2021-12-01 ENCOUNTER — Telehealth: Payer: Medicaid Other | Admitting: Family Medicine

## 2021-12-01 DIAGNOSIS — B3731 Acute candidiasis of vulva and vagina: Secondary | ICD-10-CM

## 2021-12-01 MED ORDER — FLUCONAZOLE 150 MG PO TABS
150.0000 mg | ORAL_TABLET | Freq: Once | ORAL | 0 refills | Status: DC
  Filled 2021-12-01: qty 1, 1d supply, fill #0

## 2021-12-01 MED ORDER — FLUCONAZOLE 150 MG PO TABS
150.0000 mg | ORAL_TABLET | Freq: Once | ORAL | 0 refills | Status: AC
  Filled 2021-12-01 (×2): qty 1, 1d supply, fill #0

## 2021-12-01 NOTE — Addendum Note (Signed)
Addended by: Dellia Nims on: 12/01/2021 10:23 AM   Modules accepted: Orders

## 2021-12-01 NOTE — Progress Notes (Signed)

## 2021-12-05 ENCOUNTER — Other Ambulatory Visit (HOSPITAL_COMMUNITY): Payer: Self-pay

## 2021-12-05 MED ORDER — SAXENDA 18 MG/3ML ~~LOC~~ SOPN
PEN_INJECTOR | SUBCUTANEOUS | 0 refills | Status: DC
  Filled 2021-12-05 (×2): qty 15, 30d supply, fill #0

## 2021-12-05 MED ORDER — INSULIN PEN NEEDLE 32G X 4 MM MISC
0 refills | Status: AC
Start: 2021-12-05 — End: ?
  Filled 2021-12-05: qty 100, 90d supply, fill #0

## 2021-12-07 ENCOUNTER — Other Ambulatory Visit (HOSPITAL_COMMUNITY): Payer: Self-pay

## 2022-01-08 ENCOUNTER — Other Ambulatory Visit (HOSPITAL_COMMUNITY): Payer: Self-pay

## 2022-01-08 MED ORDER — SAXENDA 18 MG/3ML ~~LOC~~ SOPN
3.0000 mg | PEN_INJECTOR | Freq: Every day | SUBCUTANEOUS | 0 refills | Status: DC
  Filled 2022-01-08: qty 15, 30d supply, fill #0

## 2022-01-09 ENCOUNTER — Other Ambulatory Visit (HOSPITAL_COMMUNITY): Payer: Self-pay

## 2022-01-23 ENCOUNTER — Other Ambulatory Visit (HOSPITAL_COMMUNITY): Payer: Self-pay

## 2022-01-23 MED ORDER — WEGOVY 1.7 MG/0.75ML ~~LOC~~ SOAJ
SUBCUTANEOUS | 0 refills | Status: AC
  Filled 2022-01-23 – 2022-01-31 (×3): qty 3, 28d supply, fill #0

## 2022-01-31 ENCOUNTER — Other Ambulatory Visit (HOSPITAL_COMMUNITY): Payer: Self-pay

## 2022-02-13 ENCOUNTER — Telehealth: Payer: Medicaid Other | Admitting: Family Medicine

## 2022-02-13 DIAGNOSIS — B379 Candidiasis, unspecified: Secondary | ICD-10-CM | POA: Diagnosis not present

## 2022-02-13 DIAGNOSIS — J029 Acute pharyngitis, unspecified: Secondary | ICD-10-CM

## 2022-02-13 DIAGNOSIS — Z8709 Personal history of other diseases of the respiratory system: Secondary | ICD-10-CM

## 2022-02-13 DIAGNOSIS — J039 Acute tonsillitis, unspecified: Secondary | ICD-10-CM

## 2022-02-13 DIAGNOSIS — T3695XA Adverse effect of unspecified systemic antibiotic, initial encounter: Secondary | ICD-10-CM

## 2022-02-13 MED ORDER — LIDOCAINE VISCOUS HCL 2 % MT SOLN: 15.0000 mL | OROMUCOSAL | 0 refills | Status: DC | PRN

## 2022-02-13 MED ORDER — CLINDAMYCIN HCL 300 MG PO CAPS: 300.0000 mg | ORAL_CAPSULE | Freq: Three times a day (TID) | ORAL | 0 refills | Status: AC

## 2022-02-13 MED ORDER — FLUCONAZOLE 150 MG PO TABS: 150.0000 mg | ORAL_TABLET | ORAL | 0 refills | Status: DC

## 2022-02-13 NOTE — Progress Notes (Signed)
[place]   Needs to convert to VV to assess with more detail to decide if in person is needed given her heavy hx of tonsil infections needing care.

## 2022-02-13 NOTE — Progress Notes (Signed)
Virtual Visit Consent   [name], you are scheduled for a virtual visit with a [facility] provider today. Just as with appointments in the office, your consent must be obtained to participate. Your consent will be active for this visit and any virtual visit you may have with one of our providers in the next 365 days. If you have a MyChart account, a copy of this consent can be sent to you electronically.  As this is a virtual visit, video technology does not allow for your provider to perform a traditional examination. This may limit your provider's ability to fully assess your condition. If your provider identifies any concerns that need to be evaluated in person or the need to arrange testing (such as labs, EKG, etc.), we will make arrangements to do so. Although advances in technology are sophisticated, we cannot ensure that it will always work on either your end or our end. If the connection with a video visit is poor, the visit may have to be switched to a telephone visit. With either a video or telephone visit, we are not always able to ensure that we have a secure connection.  By engaging in this virtual visit, you consent to the provision of healthcare and authorize for your insurance to be billed (if applicable) for the services provided during this visit. Depending on your insurance coverage, you may receive a charge related to this service.  I need to obtain your verbal consent now. Are you willing to proceed with your visit today? Dorothia Passmore has provided verbal consent on 02/13/2022 for a virtual visit (video or telephone). [name], NP  Date: 02/13/2022 10:08 AM  Virtual Visit via Video Note   I, [name], connected with  Sheela Stack  ([id number], [birth year]) on 02/13/22 at 10:00 AM EST by a video-enabled telemedicine application and verified that I am speaking with the correct person using two identifiers.  Location: Patient: Virtual Visit Location Patient:  Home Provider: Virtual Visit Location Provider: Home Office   I discussed the limitations of evaluation and management by telemedicine and the availability of in person appointments. The patient expressed understanding and agreed to proceed.    History of Present Illness: Leslie [name] is a 26 y.o. who identifies as a female who was assigned female at birth, and is being seen today for sore throat. Onset was last night with sore throat, swelling- thought it was due to weather change. Woke this morning noted swelling and soreness was worse. History of peritonsillar abscess and tonsillitis requiring IV abx. Denies trouble with secretions, swallowing food or liquids, no URI symptoms. Denies fever, chills, n/v/d/c. Denies voice changes or trouble breathing.   Problems:  Patient Active Problem List   Diagnosis Date Noted   Anemia due to acute blood loss 10/11/2019   Postpartum care following cesarean delivery 7/2 10/10/2019   Maternal anemia, with delivery, superimposed on IDA 10/10/2019   BMI 45.0-49.9, adult ([place]) 10/09/2019   Hx of postpartum depression, currently pregnant 10/09/2019   Gestational hypertension 10/09/2019   R C/S - failed TOLAC 10/09/2019   Pure hypercholesterolemia 06/12/2016    Allergies:  Allergies  Allergen Reactions   Amoxicillin    Medications:  Current Outpatient Medications:    fluticasone (FLONASE) 50 MCG/ACT nasal spray, Place 2 sprays into both nostrils daily. (Patient not taking: Reported on 06/24/2021), Disp: 16 g, Rfl: 6   Insulin Pen Needle 32G X 4 MM MISC, Use as directed with Saxenda, Disp: 100 each, Rfl:  0   naproxen sodium (ALEVE) 220 MG tablet, Take 880 mg by mouth daily as needed (pain)., Disp: , Rfl:    Semaglutide-Weight Management (WEGOVY) 1.7 MG/0.75ML SOAJ, Inject 1.7 mg into the skin once a week for 28 days., Disp: 3 mL, Rfl: 0  Observations/Objective: Patient is well-developed, well-nourished in no acute distress.  Resting comfortably  at  home.  Head is normocephalic, atraumatic.  No labored breathing.  Speech is clear and coherent with logical content.  Patient is alert and oriented at baseline.  Noted swelling of tonsil 1+ bilateral, uvula midline without being touched by tonsils. Noted white patches on tonsils. Mild erythema starting. Video limits full visual of throat.  Assessment and Plan:  1. Tonsillitis  - clindamycin (CLEOCIN) 300 MG capsule; Take 1 capsule (300 mg total) by mouth 3 (three) times daily for 10 days.  Dispense: 30 capsule; Refill: 0 - lidocaine (XYLOCAINE) 2 % solution; Use as directed 15 mLs in the mouth or throat as needed for mouth pain.  Dispense: 100 mL; Refill: 0  2. History of peritonsillar abscess  - clindamycin (CLEOCIN) 300 MG capsule; Take 1 capsule (300 mg total) by mouth 3 (three) times daily for 10 days.  Dispense: 30 capsule; Refill: 0  3. Antibiotic-induced yeast infection Provided given abx use  - fluconazole (DIFLUCAN) 150 MG tablet; Take 1 tablet (150 mg total) by mouth as directed. Repeat in 3 days as needed  Dispense: 2 tablet; Refill: 0  4. Sore throat  - lidocaine (XYLOCAINE) 2 % solution; Use as directed 15 mLs in the mouth or throat as needed for mouth pain.  Dispense: 100 mL; Refill: 0  -take full course of antibiotics as prescribed, use swish and salt water gargles to help with swelling and in throat. Rotate ibuprofen and Tylenol to help with pain. Hydrate and keep throat lubricated.  -Consider follow up with ENT to discuss needs for prevention of future infections   Follow Up Instructions: I discussed the assessment and treatment plan with the patient. The patient was provided an opportunity to ask questions and all were answered. The patient agreed with the plan and demonstrated an understanding of the instructions.  A copy of instructions were sent to the patient via MyChart unless otherwise noted below.    The patient was advised to call back or seek an  in-person evaluation if the symptoms worsen or if the condition fails to improve as anticipated.  Time:  I spent 20 minutes with the patient via telehealth technology discussing the above problems/concerns.    [name], NP

## 2022-02-13 NOTE — Patient Instructions (Addendum)
Leslie [name], thank you for joining [name], NP for today's virtual visit.  While this provider is not your primary care provider (PCP), if your PCP is located in our provider database this encounter information will be shared with them immediately following your visit.   [facility] account gives you access to today's visit and all your visits, tests, and labs performed at [facility] " click here if you don't have a [facility] account or go to [email or web address]  Consent: (Patient) Leslie [name] provided verbal consent for this virtual visit at the beginning of the encounter.  Current Medications:  Current Outpatient Medications:    clindamycin (CLEOCIN) 300 MG capsule, Take 1 capsule (300 mg total) by mouth 3 (three) times daily for 10 days., Disp: 30 capsule, Rfl: 0   fluconazole (DIFLUCAN) 150 MG tablet, Take 1 tablet (150 mg total) by mouth as directed. Repeat in 3 days as needed, Disp: 2 tablet, Rfl: 0   lidocaine (XYLOCAINE) 2 % solution, Use as directed 15 mLs in the mouth or throat as needed for mouth pain., Disp: 100 mL, Rfl: 0   fluticasone (FLONASE) 50 MCG/ACT nasal spray, Place 2 sprays into both nostrils daily. (Patient not taking: Reported on 06/24/2021), Disp: 16 g, Rfl: 6   Insulin Pen Needle 32G X 4 MM MISC, Use as directed with Saxenda, Disp: 100 each, Rfl: 0   naproxen sodium (ALEVE) 220 MG tablet, Take 880 mg by mouth daily as needed (pain)., Disp: , Rfl:    Semaglutide-Weight Management (WEGOVY) 1.7 MG/0.75ML SOAJ, Inject 1.7 mg into the skin once a week for 28 days., Disp: 3 mL, Rfl: 0   Medications ordered in this encounter:  Meds ordered this encounter  Medications   clindamycin (CLEOCIN) 300 MG capsule    Sig: Take 1 capsule (300 mg total) by mouth 3 (three) times daily for 10 days.    Dispense:  30 capsule    Refill:  0    Order Specific Question:   Supervising Provider    Answer:   Chase Picket [[id number]]    fluconazole (DIFLUCAN) 150 MG tablet    Sig: Take 1 tablet (150 mg total) by mouth as directed. Repeat in 3 days as needed    Dispense:  2 tablet    Refill:  0    Order Specific Question:   Supervising Provider    Answer:   Chase Picket [[id number]]   lidocaine (XYLOCAINE) 2 % solution    Sig: Use as directed 15 mLs in the mouth or throat as needed for mouth pain.    Dispense:  100 mL    Refill:  0    Order Specific Question:   Supervising Provider    Answer:   Chase Picket [[id number]]     *If you need refills on other medications prior to your next appointment, please contact your pharmacy*  Follow-Up: Call back or seek an in-person evaluation if the symptoms worsen or if the condition fails to improve as anticipated.  [facility] [phone]  Other Instructions  -take full course of antibiotics as prescribed, use swish and salt water gargles to help with swelling and in throat. Rotate ibuprofen and Tylenol to help with pain. Hydrate and keep throat lubricated.  -Consider follow up with ENT to discuss needs for prevention of future infections  Start diflucan after you finish Clindamycin maybe repeat in 3 days if needed.  If you have been instructed to  have an in-person evaluation today at a local Urgent Care facility, please use the link below. It will take you to a list of all of our available [facility], including address, phone number and hours of operation. Please do not delay care.  [facility]  If you or a family member do not have a primary care provider, use the link below to schedule a visit and establish care. When you choose a [facility] primary care physician or advanced practice provider, you gain a long-term partner in health. Find a Primary Care Provider  Learn more about [facility]'s in-office and virtual care options: [facility]

## 2022-02-22 ENCOUNTER — Other Ambulatory Visit (HOSPITAL_COMMUNITY): Payer: Self-pay

## 2022-02-22 MED ORDER — WEGOVY 2.4 MG/0.75ML ~~LOC~~ SOAJ
2.4000 mg | SUBCUTANEOUS | 0 refills | Status: DC
  Filled 2022-02-22 (×2): qty 3, 28d supply, fill #0

## 2022-03-21 ENCOUNTER — Other Ambulatory Visit (HOSPITAL_COMMUNITY): Payer: Self-pay

## 2022-03-21 MED ORDER — WEGOVY 2.4 MG/0.75ML ~~LOC~~ SOAJ
2.4000 mg | SUBCUTANEOUS | 0 refills | Status: AC
  Filled 2022-03-21 – 2022-03-23 (×2): qty 3, 28d supply, fill #0

## 2022-03-23 ENCOUNTER — Other Ambulatory Visit (HOSPITAL_COMMUNITY): Payer: Self-pay

## 2022-04-03 ENCOUNTER — Telehealth: Payer: Medicaid Other | Admitting: Physician Assistant

## 2022-04-03 DIAGNOSIS — J020 Streptococcal pharyngitis: Secondary | ICD-10-CM

## 2022-04-03 MED ORDER — AZITHROMYCIN 250 MG PO TABS: ORAL_TABLET | ORAL | 0 refills | Status: AC

## 2022-04-03 NOTE — Progress Notes (Signed)
E-Visit for Sore Throat - Strep Symptoms  We are sorry that you are not feeling well.  Here is how we plan to help!  Based on what you have shared with me it is likely that you have strep pharyngitis.  Strep pharyngitis is inflammation and infection in the back of the throat.  This is an infection cause by bacteria and is treated with antibiotics.  I have prescribed Azithromycin 250 mg two tablets today and then one daily for 4 additional days. For throat pain, we recommend over the counter oral pain relief medications such as acetaminophen or aspirin, or anti-inflammatory medications such as ibuprofen or naproxen sodium. Topical treatments such as oral throat lozenges or sprays may be used as needed. Strep infections are not as easily transmitted as other respiratory infections, however we still recommend that you avoid close contact with loved ones, especially the very young and elderly.  Remember to wash your hands thoroughly throughout the day as this is the number one way to prevent the spread of infection and wipe down door knobs and counters with disinfectant.   Home Care: Only take medications as instructed by your medical team. Complete the entire course of an antibiotic. Do not take these medications with alcohol. A steam or ultrasonic humidifier can help congestion.  You can place a towel over your head and breathe in the steam from hot water coming from a faucet. Avoid close contacts especially the very young and the elderly. Cover your mouth when you cough or sneeze. Always remember to wash your hands.  Get Help Right Away If: You develop worsening fever or sinus pain. You develop a severe head ache or visual changes. Your symptoms persist after you have completed your treatment plan.  Make sure you Understand these instructions. Will watch your condition. Will get help right away if you are not doing well or get worse.   Thank you for choosing an e-visit.  Your e-visit  answers were reviewed by a board certified advanced clinical practitioner to complete your personal care plan. Depending upon the condition, your plan could have included both over the counter or prescription medications.  Please review your pharmacy choice. Make sure the pharmacy is open so you can pick up prescription now. If there is a problem, you may contact your provider through [employer] and have the prescription routed to another pharmacy.  Your safety is important to [place]. If you have drug allergies check your prescription carefully.   For the next 24 hours you can use MyChart to ask questions about today's visit, request a non-urgent call back, or ask for a work or school excuse. You will get an email in the next two days asking about your experience. I hope that your e-visit has been valuable and will speed your recovery.

## 2022-04-03 NOTE — Progress Notes (Signed)
I have spent 5 minutes in review of e-visit questionnaire, review and updating patient chart, medical decision making and response to patient.   Jahmiya Guidotti Cody Amritha Yorke, PA-C    

## 2022-04-10 ENCOUNTER — Telehealth: Payer: Medicaid Other | Admitting: Family Medicine

## 2022-04-10 ENCOUNTER — Encounter: Payer: Self-pay | Admitting: Family Medicine

## 2022-04-10 DIAGNOSIS — R6889 Other general symptoms and signs: Secondary | ICD-10-CM

## 2022-04-10 NOTE — Progress Notes (Signed)
E visit for Flu like symptoms   We are sorry that you are not feeling well.  Here is how we plan to help! Based on what you have shared with me it looks like you may have flu-like symptoms that should be watched but do not seem to indicate anti-viral treatment.  Influenza or "the flu" is   an infection caused by a respiratory virus. The flu virus is highly contagious and persons who did not receive their yearly flu vaccination may "catch" the flu from close contact.  We have anti-viral medications to treat the viruses that cause this infection. They are not a "cure" and only shorten the course of the infection. These prescriptions are most effective when they are given within the first 2 days of "flu" symptoms. Antiviral medication are indicated if you have a high risk of complications from the flu. You should  also consider an antiviral medication if you are in close contact with someone who is at risk. These medications can help patients avoid complications from the flu  but have side effects that you should know. Possible side effects from Tamiflu or oseltamivir include nausea, vomiting, diarrhea, dizziness, headaches, eye redness, sleep problems or other respiratory symptoms. You should not take Tamiflu if you have an allergy to oseltamivir or any to the ingredients in Tamiflu.  Based upon your symptoms and potential risk factors I recommend that you follow the flu symptoms recommendation that I have listed below.  ANYONE WHO HAS FLU SYMPTOMS SHOULD: Stay home. The flu is highly contagious and going out or to work exposes others! Be sure to drink plenty of fluids. Water is fine as well as fruit juices, sodas and electrolyte beverages. You may want to stay away from caffeine or alcohol. If you are nauseated, try taking small sips of liquids. How do you know if you are getting enough fluid? Your urine should be a pale yellow or almost colorless. Get rest. Taking a steamy shower or using a  humidifier may help nasal congestion and ease sore throat pain. Using a saline nasal spray works much the same way. Cough drops, hard candies and sore throat lozenges may ease your cough. Line up a caregiver. Have someone check on you regularly.   GET HELP RIGHT AWAY IF: You cannot keep down liquids or your medications. You become short of breath Your fell like you are going to pass out or loose consciousness. Your symptoms persist after you have completed your treatment plan MAKE SURE YOU  Understand these instructions. Will watch your condition. Will get help right away if you are not doing well or get worse.  Your e-visit answers were reviewed by a board certified advanced clinical practitioner to complete your personal care plan.  Depending on the condition, your plan could have included both over the counter or prescription medications.  If there is a problem please reply  once you have received a response from your provider.  Your safety is important to us.  If you have drug allergies check your prescription carefully.    You can use MyChart to ask questions about today's visit, request a non-urgent call back, or ask for a work or school excuse for 24 hours related to this e-Visit. If it has been greater than 24 hours you will need to follow up with your provider, or enter a new e-Visit to address those concerns.  You will get an e-mail in the next two days asking about your experience.  I hope that   your e-visit has been valuable and will speed your recovery. Thank you for using e-visits.  I provided 5 minutes of non face-to-face time during this encounter for chart review, medication and order placement, as well as and documentation.

## 2022-04-15 ENCOUNTER — Telehealth: Payer: Medicaid Other | Admitting: Family Medicine

## 2022-04-15 ENCOUNTER — Telehealth: Payer: Medicaid Other

## 2022-04-15 DIAGNOSIS — J069 Acute upper respiratory infection, unspecified: Secondary | ICD-10-CM

## 2022-04-15 NOTE — Progress Notes (Signed)
Unable to back date work note- No charge visit. DWB

## 2022-04-23 ENCOUNTER — Encounter (HOSPITAL_COMMUNITY): Payer: Self-pay

## 2022-04-23 ENCOUNTER — Emergency Department (HOSPITAL_COMMUNITY)
Admission: EM | Admit: 2022-04-23 | Discharge: 2022-04-23 | Disposition: A | Discharge status: Home / Self Care | Payer: Medicaid Other | Attending: Emergency Medicine | Admitting: Emergency Medicine

## 2022-04-23 ENCOUNTER — Other Ambulatory Visit: Payer: Self-pay

## 2022-04-23 ENCOUNTER — Ambulatory Visit
Admission: EM | Admit: 2022-04-23 | Discharge: 2022-04-23 | Disposition: A | Discharge status: Home / Self Care | Payer: Medicaid Other | Attending: Physician Assistant | Admitting: Physician Assistant

## 2022-04-23 DIAGNOSIS — Z794 Long term (current) use of insulin: Secondary | ICD-10-CM | POA: Diagnosis not present

## 2022-04-23 DIAGNOSIS — T782XXA Anaphylactic shock, unspecified, initial encounter: Secondary | ICD-10-CM | POA: Diagnosis not present

## 2022-04-23 DIAGNOSIS — R519 Headache, unspecified: Secondary | ICD-10-CM | POA: Diagnosis present

## 2022-04-23 DIAGNOSIS — T7840XA Allergy, unspecified, initial encounter: Secondary | ICD-10-CM | POA: Diagnosis not present

## 2022-04-23 MED ORDER — METHYLPREDNISOLONE ACETATE 80 MG/ML IJ SUSP
125.0000 mg | Freq: Once | INTRAMUSCULAR | Status: AC
  Administered 2022-04-23: 125 mg via INTRAMUSCULAR

## 2022-04-23 MED ORDER — DIPHENHYDRAMINE HCL 50 MG/ML IJ SOLN
50.0000 mg | Freq: Once | INTRAMUSCULAR | Status: AC
  Administered 2022-04-23: 50 mg via INTRAMUSCULAR

## 2022-04-23 MED ORDER — EPINEPHRINE 0.3 MG/0.3ML IJ SOAJ
0.5000 mg | Freq: Once | INTRAMUSCULAR | Status: AC
  Administered 2022-04-23: 0.5 mg via INTRAMUSCULAR

## 2022-04-23 MED ORDER — EPINEPHRINE 0.3 MG/0.3ML IJ SOAJ: 0.3000 mg | INTRAMUSCULAR | 0 refills | Status: AC | PRN

## 2022-04-23 MED ORDER — EPINEPHRINE 0.3 MG/0.3ML IJ SOAJ: 0.3000 mg | INTRAMUSCULAR | 0 refills | Status: DC | PRN

## 2022-04-23 NOTE — ED Notes (Signed)
Patient is being discharged from the Urgent Care and sent to the Emergency Department via ems . Per Shanon Brow, patient is in need of higher level of care due to anaphylaxis. Patient is aware and verbalizes understanding of plan of care. There were no vitals filed for this visit.

## 2022-04-23 NOTE — ED Triage Notes (Signed)
Per EMS patient was eating a seafood platter from [employer]. After words had a headache and took Excedrin around 1330. EMS reports around 30 mins later patient began to experience throat tightening, itchiness. Patient went home and got in the shower which made the symptoms worse. Patient went to the urgent care where they gave her 0.'5mg'$  epi, 125 solumedrol and '50mg'$  of benadryl. Patient received another 0.'3mg'$  epi. EMS reports that since then her symptoms have resolved mostly. Patient ambulatory and alert and oriented.

## 2022-04-23 NOTE — ED Provider Notes (Signed)
[facility] EMERGENCY DEPARTMENT Provider Note   CSN: [id number] Arrival date & time: 04/23/22  1721     History  Chief Complaint  Patient presents with   Allergic Reaction    Leslie [name] is a 27 y.o. female.  HPI Patient sent in after allergic reaction.  Eats seafood platter for lunch then developed a headache.  Took some Excedrin.  About 30 minutes after the Excedrin developed swelling in her mouth itchiness throat tightness.  Took a shower which made things worse.  Then went to urgent care and was given 0.5 mg of epinephrine subcu 125 mg of Solu-Medrol and 50 mg of Benadryl.  Then got a second dose of epi at this time 0.3 mg.  Felt better after the epi.  Still a little itchy but states feels better before.  No known serious allergies previously.  States she has had both Excedrin and the shrimp in the past.    Home Medications Prior to Admission medications   Medication Sig Start Date End Date Taking? Authorizing Provider  EPINEPHrine 0.3 mg/0.3 mL IJ SOAJ injection Inject 0.3 mg into the muscle as needed for anaphylaxis. 04/23/22   Davonna Belling, MD  fluconazole (DIFLUCAN) 150 MG tablet Take 1 tablet (150 mg total) by mouth as directed. Repeat in 3 days as needed 02/13/22   [name], NP  fluticasone [facility]) 50 MCG/ACT nasal spray Place 2 sprays into both nostrils daily. Patient not taking: Reported on 06/24/2021 05/31/21   Evelina Dun A, FNP  Insulin Pen Needle 32G X 4 MM MISC Use as directed with Saxenda 12/05/21     lidocaine (XYLOCAINE) 2 % solution Use as directed 15 mLs in the mouth or throat as needed for mouth pain. 02/13/22   [name], NP  naproxen sodium (ALEVE) 220 MG tablet Take 880 mg by mouth daily as needed (pain).    [provider]  Semaglutide-Weight Management (WEGOVY) 1.7 MG/0.75ML SOAJ Inject 1.7 mg into the skin once a week for 28 days. 01/22/22     Semaglutide-Weight Management (WEGOVY) 2.4 MG/0.75ML SOAJ Inject 2.4  mg into the skin once a week. 03/21/22         Allergies    Amoxicillin    Review of Systems   Review of Systems  Physical Exam Updated Vital Signs BP (!) 147/91   Pulse 87   Temp 98 F (36.7 C)   Resp 17   SpO2 99%  Physical Exam Vitals and nursing note reviewed.  Constitutional:      Appearance: She is obese.  HENT:     Head: Atraumatic.  Cardiovascular:     Rate and Rhythm: Regular rhythm.  Pulmonary:     Breath sounds: No wheezing or rhonchi.  Abdominal:     Tenderness: There is no abdominal tenderness.  Musculoskeletal:     Cervical back: Neck supple.  Skin:    Capillary Refill: Capillary refill takes less than 2 seconds.  Neurological:     Mental Status: She is alert.     ED Results / Procedures / Treatments   Labs (all labs ordered are listed, but only abnormal results are displayed) Labs Reviewed - No data to display  EKG None  Radiology No results found.  Procedures Procedures    Medications Ordered in ED Medications - No data to display  ED Course/ Medical Decision Making/ A&P  Medical Decision Making Risk Prescription drug management.   Patient with allergic reaction.  Likely to either food or Excedrin.  Has had 2 doses of epi at the urgent care.  Also had Benadryl and steroids.  Will continue to monitor.  Reviewed patient's history.  If worsens will potentially require admission to the hospital.  Now airway involvement appears resolved.  Itching improved also.  No tongue swelling. . Patient's been monitored for over 3 hours.  Still feeling better.  No further recurrence.  Will discharge home with EpiPen's and outpatient follow-up.  will return for worsening symptoms.         Final Clinical Impression(s) / ED Diagnoses Final diagnoses:  Allergic reaction, initial encounter    Rx / DC Orders ED Discharge Orders          Ordered    EPINEPHrine 0.3 mg/0.3 mL IJ SOAJ injection  As needed,   Status:   Discontinued        04/23/22 2051    EPINEPHrine 0.3 mg/0.3 mL IJ SOAJ injection  As needed        04/23/22 2101              Davonna Belling, MD 04/23/22 2141

## 2022-04-23 NOTE — ED Notes (Signed)
Provider at bedside 

## 2022-04-23 NOTE — ED Provider Notes (Signed)
[facility]    CSN: [id number] Arrival date & time: 04/23/22  1618      History   Chief Complaint Chief Complaint  Patient presents with   anaphylaxis    HPI Leslie [name] is a 27 y.o. female.   27 year old female presents with acute shortness of breath, tongue swelling and difficulty breathing.  Patient indicates that around 1:00 this afternoon she ate shrimp, then about 20 minutes later she took some Excedrin migraine for headache.  She indicates that several minutes later she started having difficulty breathing, itching, feeling tongue was swelling.  She indicates that her symptoms started progressing and she then took a hot shower to try to relieve her symptoms.  She relates that this did not help and that her symptoms increased.  She relates presenting to the urgent care having difficulty breathing, wheezing, raspy hoarseness trying to talk.  She is indicating that her tongue felt like it was closing, that her throat felt like it was swelling.  Indicated she was itching all over and breaking out in hives.  She indicates that she has never had problems with eating shrimp before or taking Excedrin.  She indicates this is the first time that this has happened to her.  Patient denies fever, nausea or vomiting.     Past Medical History:  Diagnosis Date   Medical history non-contributory    Peritonsillar abscess     Patient Active Problem List   Diagnosis Date Noted   Anemia due to acute blood loss 10/11/2019   Postpartum care following cesarean delivery 7/2 10/10/2019   Maternal anemia, with delivery, superimposed on IDA 10/10/2019   BMI 45.0-49.9, adult ([place]) 10/09/2019   Hx of postpartum depression, currently pregnant 10/09/2019   Gestational hypertension 10/09/2019   R C/S - failed TOLAC 10/09/2019   Pure hypercholesterolemia 06/12/2016    Past Surgical History:  Procedure Laterality Date   CESAREAN SECTION  09/26/2013   CESAREAN SECTION N/A 10/09/2019    Procedure: CESAREAN SECTION;  Surgeon: Everett Graff, MD;  Location: [place] ORS;  Service: Obstetrics;  Laterality: N/A;   INCISION AND DRAINAGE OF PERITONSILLAR ABCESS      OB History     Gravida  2   Para  2   Term  2   Preterm      AB      Living  2      SAB      IAB      Ectopic      Multiple  0   Live Births  2            Home Medications    Prior to Admission medications   Medication Sig Start Date End Date Taking? Authorizing Provider  fluconazole (DIFLUCAN) 150 MG tablet Take 1 tablet (150 mg total) by mouth as directed. Repeat in 3 days as needed 02/13/22   [name], NP  fluticasone [facility]) 50 MCG/ACT nasal spray Place 2 sprays into both nostrils daily. Patient not taking: Reported on 06/24/2021 05/31/21   Evelina Dun A, FNP  Insulin Pen Needle 32G X 4 MM MISC Use as directed with Saxenda 12/05/21     lidocaine (XYLOCAINE) 2 % solution Use as directed 15 mLs in the mouth or throat as needed for mouth pain. 02/13/22   [name], NP  naproxen sodium (ALEVE) 220 MG tablet Take 880 mg by mouth daily as needed (pain).    [provider]  Semaglutide-Weight Management (WEGOVY) 1.7  MG/0.75ML SOAJ Inject 1.7 mg into the skin once a week for 28 days. 01/22/22     Semaglutide-Weight Management (WEGOVY) 2.4 MG/0.75ML SOAJ Inject 2.4 mg into the skin once a week. 03/21/22       Family History Family History  Problem Relation Age of Onset   Hypertension Mother    Diabetes Maternal Grandmother    Diabetes Maternal Grandfather     Social History Social History   Tobacco Use   Smoking status: Never   Smokeless tobacco: Never  Vaping Use   Vaping Use: Never used  Substance Use Topics   Alcohol use: No   Drug use: No     Allergies   Amoxicillin   Review of Systems Review of Systems  Respiratory:  Positive for shortness of breath, wheezing and stridor.      Physical Exam Triage Vital Signs ED Triage Vitals  Enc Vitals  Group     BP 04/23/22 1625 (!) 150/87     Pulse Rate 04/23/22 1615 (!) 135     Resp 04/23/22 1615 (!) 28     Temp 04/23/22 1625 98.3 F (36.8 C)     Temp Source 04/23/22 1625 Oral     SpO2 04/23/22 1615 92 %     Weight --      Height --      Head Circumference --      Peak Flow --      Pain Score 04/23/22 1622 0     Pain Loc --      Pain Edu? --      Excl. in [place]? --    No data found.  Updated Vital Signs BP (!) 150/87 (BP Location: Right Arm)   Pulse (!) 120   Temp 98.3 F (36.8 C) (Oral)   Resp (!) 22   SpO2 97%   Visual Acuity Right Eye Distance:   Left Eye Distance:   Bilateral Distance:    Right Eye Near:   Left Eye Near:    Bilateral Near:     Physical Exam Constitutional:      General: She is in acute distress (difficulty breathing, wheezing, raspy voice).     Appearance: She is ill-appearing and diaphoretic.  HENT:     Mouth/Throat:     Comments: Mouth: Lips are swollen 1+, tongue is swollen 1+. Cardiovascular:     Rate and Rhythm: Normal rate and regular rhythm.     Heart sounds: Normal heart sounds.  Pulmonary:     Effort: Pulmonary effort is normal.     Breath sounds: Normal air entry. Examination of the right-lower field reveals wheezing. Examination of the left-lower field reveals wheezing. Wheezing (mild bilat) present. No rhonchi or rales.  Lymphadenopathy:     Cervical: No cervical adenopathy.  Skin:    Comments: Skin: Hives are present upper and lower extremities, back, and anterior chest.  The hive reaction is a moderate outbreak.      UC Treatments / Results  Labs (all labs ordered are listed, but only abnormal results are displayed) Labs Reviewed - No data to display  EKG   Radiology No results found.  Procedures Procedures (including critical care time)  Medications Ordered in UC Medications  diphenhydrAMINE (BENADRYL) injection 50 mg (50 mg Intramuscular Given 04/23/22 1622)  EPINEPHrine (EPI-PEN) injection 0.5 mg (0.5 mg  Intramuscular Given 04/23/22 1622)  methylPREDNISolone acetate (DEPO-MEDROL) injection 125 mg (125 mg Intramuscular Given 04/23/22 1622)    Initial Impression / Assessment and Plan /  UC Course  I have reviewed the triage vital signs and the nursing notes.  Pertinent labs & imaging results that were available during my care of the patient were reviewed by me and considered in my medical decision making (see chart for details).    Plan: 1.  The anaphylactic reaction will be treated with the following: A.  Epi 0.5 cc [place] given in the office on patient presentation. B.  Benadryl 50 mg IM given in the office on patient presentation. C.  Solu-Medrol 125 mg given IM in the office on patient presentation. D.  Another epi 0.3 cc given in the office 10 minutes after the initial dose. 2.  The allergic reaction will be treated with the following. A.  Epi 0.5 cc [place] given in the office on presentation. B.  Benadryl 50 mg IM given in the office on presentation. 3.  Patient is transported by EMS to the hospital for higher level of care. Final Clinical Impressions(s) / UC Diagnoses   Final diagnoses:  Allergic reaction, initial encounter  Anaphylaxis, initial encounter     Discharge Instructions      Patient is transferred to the emergency room by EMS due to anaphylactic reaction.    ED Prescriptions   None    PDMP not reviewed this encounter.   Nyoka Lint, PA-C 04/23/22 1644

## 2022-04-23 NOTE — ED Notes (Signed)
RN was notified by pt access of pt experiencing severe allergic rxn. Pt was immediately brought to room 4 before registration. PA David immediately at bedside. Verbal order for 0.'5mg'$  Epi, '12mg'$  solu-medrol, '50mg'$  benadryl all IM. Meds administered as ordered. EMS contacted by another employee while medications were being administered.

## 2022-04-23 NOTE — Discharge Instructions (Signed)
Follow up with your primary care doctor or an allergist

## 2022-04-23 NOTE — Discharge Instructions (Signed)
Patient is transferred to the emergency room by EMS due to anaphylactic reaction.

## 2022-07-26 ENCOUNTER — Telehealth: Payer: Medicaid Other | Admitting: Physician Assistant

## 2022-07-26 DIAGNOSIS — T753XXA Motion sickness, initial encounter: Secondary | ICD-10-CM | POA: Diagnosis not present

## 2022-07-26 MED ORDER — MECLIZINE HCL 25 MG PO TABS: 25.0000 mg | ORAL_TABLET | Freq: Three times a day (TID) | ORAL | 0 refills | Status: AC | PRN

## 2022-07-26 NOTE — Progress Notes (Signed)
E Visit for Motion Sickness  We are sorry that you are not feeling well. Here is how we plan to help!  Based on what you have shared with me it looks like you have symptoms of motion sickness.  I have prescribed a medication that will help prevent or alleviate your symptoms:  Meclizine 25mg by mouth three times per day as needed for nausea/motion sickness   Prevention:  You might feel better if you keep your eyes focused on outside while you are in motion. For example, if you are in a car, sit in the front and look in the direction you are moving; if you are on a boat, stay on the deck and look to the horizon. This helps make what you see match the movement you are feeling, and so you are less likely to feel sick.  You should also avoid reading, watching a movie, texting or reading messages, or looking at things close to you inside the vehicle you are riding in.  Use the seat head rest. Lean your head against the back of the seat or head rest when traveling in vehicles with seats to minimize head movements.  On a ship: When making your reservations, choose a cabin in the middle of the ship and near the waterline. When on board, go up on deck and focus on the horizon.  In an airplane: Request a window seat and look out the window. A seat over the front edge of the wing is the most preferable spot (the degree of motion is the lowest here). Direct the air vent to blow cool air on your face.  On a train: Always face forward and sit near a window.  In a vehicle: Sit in the front seat; if you are the passenger, look at the scenery in the distance. For some people, driving the vehicle (rather than being a passenger) is an instant remedy.  Avoid others who have become nauseous with motion sickness. Seeing and smelling others who have motion sickness may cause you to become sick.  GET HELP RIGHT AWAY IF:  Your symptoms do not improve or worsen within 2 days after treatment.  You cannot keep  down fluids after trying the medication.  Other associated symptoms such as severe headache, visual field changes, fever, or intractable nausea and vomiting.  MAKE SURE YOU:  Understand these instructions. Will watch your condition. Will get help right away if you are not doing well or get worse.  Thank you for choosing an e-visit.  Your e-visit answers were reviewed by a board certified advanced clinical practitioner to complete your personal care plan. Depending upon the condition, your plan could have included both over the counter or prescription medications.  Please review your pharmacy choice. Be sure that the pharmacy you have chosen is open so that you can pick up your prescription now.  If there is a problem you may message your provider in MyChart to have the prescription routed to another pharmacy.  Your safety is important to us. If you have drug allergies check your prescription carefully.   For the next 24 hours, you can use MyChart to ask questions about today's visit, request a non-urgent call back, or ask for a work or school excuse from your e-visit provider.  You will get an e-mail in the next two days asking about your experience. I hope that your e-visit has been valuable and will speed your recovery.   References or for more information: https://wwwnc.cdc.gov/travel/yellowbook/2020/travel-by-air-land-sea/motion-sickness https://my.[facility].org/health/articles/12782-motion-sickness https://www.uptodate.com    I have spent 5 minutes in review of e-visit questionnaire, review and updating patient chart, medical decision making and response to patient.   Emmy Keng M Lavina Resor, PA-C  

## 2022-08-26 ENCOUNTER — Telehealth: Payer: Medicaid Other | Admitting: Family Medicine

## 2022-08-26 DIAGNOSIS — J029 Acute pharyngitis, unspecified: Secondary | ICD-10-CM

## 2022-08-26 NOTE — Progress Notes (Signed)
E-Visit for Sore Throat  We are sorry that you are not feeling well.  Here is how we plan to help!  Your symptoms indicate a likely viral infection (Pharyngitis).   Pharyngitis is inflammation in the back of the throat which can cause a sore throat, scratchiness and sometimes difficulty swallowing.   Pharyngitis is typically caused by a respiratory virus and will just run its course.  Please keep in mind that your symptoms could last up to 10 days.  For throat pain, we recommend over the counter oral pain relief medications such as acetaminophen or aspirin, or anti-inflammatory medications such as ibuprofen or naproxen sodium.  Topical treatments such as oral throat lozenges or sprays may be used as needed.  Avoid close contact with loved ones, especially the very young and elderly.  Remember to wash your hands thoroughly throughout the day as this is the number one way to prevent the spread of infection and wipe down door knobs and counters with disinfectant.  After careful review of your answers, I would not recommend an antibiotic for your condition.  Antibiotics should not be used to treat conditions that we suspect are caused by viruses like the virus that causes the common cold or flu. However, some people can have Strep with atypical symptoms. You may need formal testing in clinic or office to confirm if your symptoms continue or worsen.  Providers prescribe antibiotics to treat infections caused by bacteria. Antibiotics are very powerful in treating bacterial infections when they are used properly.  To maintain their effectiveness, they should be used only when necessary.  Overuse of antibiotics has resulted in the development of super bugs that are resistant to treatment!    Home Care: Only take medications as instructed by your medical team. Do not drink alcohol while taking these medications. A steam or ultrasonic humidifier can help congestion.  You can place a towel over your head and  breathe in the steam from hot water coming from a faucet. Avoid close contacts especially the very young and the elderly. Cover your mouth when you cough or sneeze. Always remember to wash your hands.  Get Help Right Away If: You develop worsening fever or throat pain. You develop a severe head ache or visual changes. Your symptoms persist after you have completed your treatment plan.  Make sure you Understand these instructions. Will watch your condition. Will get help right away if you are not doing well or get worse.   Thank you for choosing an e-visit.  Your e-visit answers were reviewed by a board certified advanced clinical practitioner to complete your personal care plan. Depending upon the condition, your plan could have included both over the counter or prescription medications.  Please review your pharmacy choice. Make sure the pharmacy is open so you can pick up prescription now. If there is a problem, you may contact your provider through MyChart messaging and have the prescription routed to another pharmacy.  Your safety is important to us. If you have drug allergies check your prescription carefully.   For the next 24 hours you can use MyChart to ask questions about today's visit, request a non-urgent call back, or ask for a work or school excuse. You will get an email in the next two days asking about your experience. I hope that your e-visit has been valuable and will speed your recovery.    have provided 5 minutes of non face to face time during this encounter for chart review and documentation.   

## 2022-12-08 ENCOUNTER — Telehealth: Payer: Medicaid Other | Admitting: Family Medicine

## 2022-12-08 ENCOUNTER — Telehealth: Payer: Medicaid Other | Admitting: Nurse Practitioner

## 2022-12-08 DIAGNOSIS — Z20818 Contact with and (suspected) exposure to other bacterial communicable diseases: Secondary | ICD-10-CM | POA: Diagnosis not present

## 2022-12-08 DIAGNOSIS — J029 Acute pharyngitis, unspecified: Secondary | ICD-10-CM

## 2022-12-08 MED ORDER — CEFDINIR 300 MG PO CAPS: 300.0000 mg | ORAL_CAPSULE | Freq: Two times a day (BID) | ORAL | 0 refills | Status: AC

## 2022-12-08 NOTE — Progress Notes (Signed)
I have spent 5 minutes in review of e-visit questionnaire, review and updating patient chart, medical decision making and response to patient.  ° °Zelda W Fleming, NP ° °  °

## 2022-12-08 NOTE — Progress Notes (Signed)
Virtual Visit Consent   Leslie [name], you are scheduled for a virtual visit with a [facility] provider today. Just as with appointments in the office, your consent must be obtained to participate. Your consent will be active for this visit and any virtual visit you may have with one of our providers in the next 365 days. If you have a MyChart account, a copy of this consent can be sent to you electronically.  As this is a virtual visit, video technology does not allow for your provider to perform a traditional examination. This may limit your provider's ability to fully assess your condition. If your provider identifies any concerns that need to be evaluated in person or the need to arrange testing (such as labs, EKG, etc.), we will make arrangements to do so. Although advances in technology are sophisticated, we cannot ensure that it will always work on either your end or our end. If the connection with a video visit is poor, the visit may have to be switched to a telephone visit. With either a video or telephone visit, we are not always able to ensure that we have a secure connection.  By engaging in this virtual visit, you consent to the provision of healthcare and authorize for your insurance to be billed (if applicable) for the services provided during this visit. Depending on your insurance coverage, you may receive a charge related to this service.  I need to obtain your verbal consent now. Are you willing to proceed with your visit today? Leslie [name] has provided verbal consent on 12/08/2022 for a virtual visit (video or telephone). Leslie Curio, FNP  Date: 12/08/2022 10:32 AM  Virtual Visit via Video Note   I, Leslie [name], connected with  Leslie [name]  ([id number], 1995-07-24) on 12/08/22 at 10:30 AM EDT by a video-enabled telemedicine application and verified that I am speaking with the correct person using two identifiers.  Location: Patient: Virtual Visit Location Patient: Home Provider:  Virtual Visit Location Provider: Home Office   I discussed the limitations of evaluation and management by telemedicine and the availability of in person appointments. The patient expressed understanding and agreed to proceed.    History of Present Illness: Leslie [name] is a 27 y.o. who identifies as a female who was assigned female at birth, and is being seen today for sore throat for 3-4 days worsening with fever starting today. She reports exposure to her sons friend from school with strep. Leslie [name] Kitchen  HPI: HPI  Problems:  Patient Active Problem List   Diagnosis Date Noted   Anemia due to acute blood loss 10/11/2019   Postpartum care following cesarean delivery 7/2 10/10/2019   Maternal anemia, with delivery, superimposed on IDA 10/10/2019   BMI 45.0-49.9, adult (HCC) 10/09/2019   Hx of postpartum depression, currently pregnant 10/09/2019   Gestational hypertension 10/09/2019   R C/S - failed TOLAC 10/09/2019   Pure hypercholesterolemia 06/12/2016    Allergies:  Allergies  Allergen Reactions   Amoxicillin    Medications:  Current Outpatient Medications:    cefdinir (OMNICEF) 300 MG capsule, Take 1 capsule (300 mg total) by mouth 2 (two) times daily for 10 days., Disp: 20 capsule, Rfl: 0   EPINEPHrine 0.3 mg/0.3 mL IJ SOAJ injection, Inject 0.3 mg into the muscle as needed for anaphylaxis., Disp: 2 each, Rfl: 0   fluconazole (DIFLUCAN) 150 MG tablet, Take 1 tablet (150 mg total) by mouth as directed. Repeat in 3 days as needed, Disp: 2 tablet, Rfl: 0  fluticasone (FLONASE) 50 MCG/ACT nasal spray, Place 2 sprays into both nostrils daily. (Patient not taking: Reported on 06/24/2021), Disp: 16 g, Rfl: 6   Insulin Pen Needle 32G X 4 MM MISC, Use as directed with Saxenda, Disp: 100 each, Rfl: 0   lidocaine (XYLOCAINE) 2 % solution, Use as directed 15 mLs in the mouth or throat as needed for mouth pain., Disp: 100 mL, Rfl: 0   meclizine (ANTIVERT) 25 MG tablet, Take 1 tablet (25 mg total) by  mouth 3 (three) times daily as needed for dizziness., Disp: 30 tablet, Rfl: 0   naproxen sodium (ALEVE) 220 MG tablet, Take 880 mg by mouth daily as needed (pain)., Disp: , Rfl:    Semaglutide-Weight Management (WEGOVY) 1.7 MG/0.75ML SOAJ, Inject 1.7 mg into the skin once a week for 28 days., Disp: 3 mL, Rfl: 0   Semaglutide-Weight Management (WEGOVY) 2.4 MG/0.75ML SOAJ, Inject 2.4 mg into the skin once a week., Disp: 9 mL, Rfl: 0  Observations/Objective: Patient is well-developed, well-nourished in no acute distress.  Resting comfortably  at home.  Head is normocephalic, atraumatic.  No labored breathing.  Speech is clear and coherent with logical content.  Patient is alert and oriented at baseline.    Assessment and Plan: 1. Acute pharyngitis, unspecified etiology  2. Exposure to strep throat  Increase fluids, warm salt water gargles, ibuprofen as directed, UC if sx worsen.   Follow Up Instructions: I discussed the assessment and treatment plan with the patient. The patient was provided an opportunity to ask questions and all were answered. The patient agreed with the plan and demonstrated an understanding of the instructions.  A copy of instructions were sent to the patient via MyChart unless otherwise noted below.     The patient was advised to call back or seek an in-person evaluation if the symptoms worsen or if the condition fails to improve as anticipated.  Time:  I spent 10 minutes with the patient via telehealth technology discussing the above problems/concerns.    Leslie Curio, FNP

## 2022-12-08 NOTE — Progress Notes (Signed)
E-Visit for Sore Throat  We are sorry that you are not feeling well.  Here is how we plan to help!   I recommend warm salt water gargles, liquid ibuprofen/motrin every 4-6 hours or warm tea with honey  If you are not feeling better in the next 3-4 days please let us know.   Providers prescribe antibiotics to treat infections caused by bacteria. Antibiotics are very powerful in treating bacterial infections when they are used properly. To maintain their effectiveness, they should be used only when necessary. Overuse of antibiotics has resulted in the development of superbugs that are resistant to treatment!    After careful review of your answers, I would not recommend an antibiotic for your condition.  Antibiotics are not effective against viruses and therefore should not be used to treat them. Common examples of infections caused by viruses include colds and flu   Your symptoms indicate a likely viral infection (Pharyngitis).   Pharyngitis is inflammation in the back of the throat which can cause a sore throat, scratchiness and sometimes difficulty swallowing.   Pharyngitis is typically caused by a respiratory virus and will just run its course.  Please keep in mind that your symptoms could last up to 10 days.  For throat pain, we recommend over the counter oral pain relief medications such as acetaminophen or aspirin, or anti-inflammatory medications such as ibuprofen or naproxen sodium.  Topical treatments such as oral throat lozenges or sprays may be used as needed.  Avoid close contact with loved ones, especially the very young and elderly.  Remember to wash your hands thoroughly throughout the day as this is the number one way to prevent the spread of infection and wipe down door knobs and counters with disinfectant.  After careful review of your answers, I would not recommend an antibiotic for your condition.  Antibiotics should not be used to treat conditions that we suspect are caused by  viruses like the virus that causes the common cold or flu. However, some people can have Strep with atypical symptoms. You may need formal testing in clinic or office to confirm if your symptoms continue or worsen.  Providers prescribe antibiotics to treat infections caused by bacteria. Antibiotics are very powerful in treating bacterial infections when they are used properly.  To maintain their effectiveness, they should be used only when necessary.  Overuse of antibiotics has resulted in the development of super bugs that are resistant to treatment!    Home Care: Only take medications as instructed by your medical team. Do not drink alcohol while taking these medications. A steam or ultrasonic humidifier can help congestion.  You can place a towel over your head and breathe in the steam from hot water coming from a faucet. Avoid close contacts especially the very young and the elderly. Cover your mouth when you cough or sneeze. Always remember to wash your hands.  Get Help Right Away If: You develop worsening fever or throat pain. You develop a severe head ache or visual changes. Your symptoms persist after you have completed your treatment plan.  Make sure you Understand these instructions. Will watch your condition. Will get help right away if you are not doing well or get worse.   Thank you for choosing an e-visit.  Your e-visit answers were reviewed by a board certified advanced clinical practitioner to complete your personal care plan. Depending upon the condition, your plan could have included both over the counter or prescription medications.  Please review your  pharmacy choice. Make sure the pharmacy is open so you can pick up prescription now. If there is a problem, you may contact your provider through [employer] and have the prescription routed to another pharmacy.  Your safety is important to [place]. If you have drug allergies check your prescription carefully.   For the  next 24 hours you can use MyChart to ask questions about today's visit, request a non-urgent call back, or ask for a work or school excuse. You will get an email in the next two days asking about your experience. I hope that your e-visit has been valuable and will speed your recovery.

## 2022-12-08 NOTE — Patient Instructions (Signed)

## 2023-01-29 ENCOUNTER — Telehealth: Payer: Medicaid Other | Admitting: Physician Assistant

## 2023-01-29 DIAGNOSIS — B3731 Acute candidiasis of vulva and vagina: Secondary | ICD-10-CM | POA: Diagnosis not present

## 2023-01-29 MED ORDER — FLUCONAZOLE 150 MG PO TABS: 150.0000 mg | ORAL_TABLET | Freq: Once | ORAL | 0 refills | Status: AC

## 2023-01-29 NOTE — Progress Notes (Signed)
I have spent 5 minutes in review of e-visit questionnaire, review and updating patient chart, medical decision making and response to patient.   Mia Milan Cody Jacklynn Dehaas, PA-C    

## 2023-01-29 NOTE — Progress Notes (Signed)

## 2023-02-19 ENCOUNTER — Telehealth: Payer: Medicaid Other | Admitting: Physician Assistant

## 2023-02-19 DIAGNOSIS — R3989 Other symptoms and signs involving the genitourinary system: Secondary | ICD-10-CM

## 2023-02-19 MED ORDER — NITROFURANTOIN MONOHYD MACRO 100 MG PO CAPS: 100.0000 mg | ORAL_CAPSULE | Freq: Two times a day (BID) | ORAL | 0 refills | Status: DC

## 2023-02-19 NOTE — Progress Notes (Signed)
I have spent 5 minutes in review of e-visit questionnaire, review and updating patient chart, medical decision making and response to patient.   Mia Milan Cody Jacklynn Dehaas, PA-C    

## 2023-02-19 NOTE — Progress Notes (Signed)
E-Visit for Urinary Problems  We are sorry that you are not feeling well.  Here is how we plan to help!  Based on what you shared with me it looks like you most likely have a simple urinary tract infection.  A UTI (Urinary Tract Infection) is a bacterial infection of the bladder.  Most cases of urinary tract infections are simple to treat but a key part of your care is to encourage you to drink plenty of fluids and watch your symptoms carefully.  I have prescribed MacroBid 100 mg twice a day for 5 days.  Your symptoms should gradually improve. Call us if the burning in your urine worsens, you develop worsening fever, back pain or pelvic pain or if your symptoms do not resolve after completing the antibiotic.  Urinary tract infections can be prevented by drinking plenty of water to keep your body hydrated.  Also be sure when you wipe, wipe from front to back and don't hold it in!  If possible, empty your bladder every 4 hours.  I have sent a work note to Pharmacologist. You can find by going to the Menu on your homepage, scrolling down to the Communications section, and selecting Letters. Let us know if you have any issue locating. Take care and feel better soon!   HOME CARE Drink plenty of fluids Compete the full course of the antibiotics even if the symptoms resolve Remember, when you need to go.go. Holding in your urine can increase the likelihood of getting a UTI! GET HELP RIGHT AWAY IF: You cannot urinate You get a high fever Worsening back pain occurs You see blood in your urine You feel sick to your stomach or throw up You feel like you are going to pass out  MAKE SURE YOU  Understand these instructions. Will watch your condition. Will get help right away if you are not doing well or get worse.   Thank you for choosing an e-visit.  Your e-visit answers were reviewed by a board certified advanced clinical practitioner to complete your personal care plan. Depending upon the  condition, your plan could have included both over the counter or prescription medications.  Please review your pharmacy choice. Make sure the pharmacy is open so you can pick up prescription now. If there is a problem, you may contact your provider through [employer] and have the prescription routed to another pharmacy.  Your safety is important to [place]. If you have drug allergies check your prescription carefully.   For the next 24 hours you can use MyChart to ask questions about today's visit, request a non-urgent call back, or ask for a work or school excuse. You will get an email in the next two days asking about your experience. I hope that your e-visit has been valuable and will speed your recovery.

## 2023-02-28 ENCOUNTER — Telehealth: Payer: Medicaid Other

## 2023-02-28 ENCOUNTER — Telehealth: Payer: Medicaid Other | Admitting: Physician Assistant

## 2023-02-28 DIAGNOSIS — J020 Streptococcal pharyngitis: Secondary | ICD-10-CM

## 2023-02-28 MED ORDER — AZITHROMYCIN 250 MG PO TABS: ORAL_TABLET | ORAL | 0 refills | Status: AC

## 2023-02-28 NOTE — Progress Notes (Signed)
I have spent 5 minutes in review of e-visit questionnaire, review and updating patient chart, medical decision making and response to patient.   Mia Milan Cody Jacklynn Dehaas, PA-C    

## 2023-02-28 NOTE — Progress Notes (Signed)
E-Visit for Sore Throat - Strep Symptoms  We are sorry that you are not feeling well.  Here is how we plan to help!  Based on what you have shared with me it is likely that you have strep pharyngitis.  Strep pharyngitis is inflammation and infection in the back of the throat.  This is an infection cause by bacteria and is treated with antibiotics.  I have prescribed Azithromycin 250 mg two tablets today and then one daily for 4 additional days. For throat pain, we recommend over the counter oral pain relief medications such as acetaminophen or aspirin, or anti-inflammatory medications such as ibuprofen or naproxen sodium. Topical treatments such as oral throat lozenges or sprays may be used as needed. Strep infections are not as easily transmitted as other respiratory infections, however we still recommend that you avoid close contact with loved ones, especially the very young and elderly.  Remember to wash your hands thoroughly throughout the day as this is the number one way to prevent the spread of infection and wipe down door knobs and counters with disinfectant.   Home Care: Only take medications as instructed by your medical team. Complete the entire course of an antibiotic. Do not take these medications with alcohol. A steam or ultrasonic humidifier can help congestion.  You can place a towel over your head and breathe in the steam from hot water coming from a faucet. Avoid close contacts especially the very young and the elderly. Cover your mouth when you cough or sneeze. Always remember to wash your hands.  Get Help Right Away If: You develop worsening fever or sinus pain. You develop a severe head ache or visual changes. Your symptoms persist after you have completed your treatment plan.  Make sure you Understand these instructions. Will watch your condition. Will get help right away if you are not doing well or get worse.   Thank you for choosing an e-visit.  Your e-visit  answers were reviewed by a board certified advanced clinical practitioner to complete your personal care plan. Depending upon the condition, your plan could have included both over the counter or prescription medications.  Please review your pharmacy choice. Make sure the pharmacy is open so you can pick up prescription now. If there is a problem, you may contact your provider through [employer] and have the prescription routed to another pharmacy.  Your safety is important to [place]. If you have drug allergies check your prescription carefully.   For the next 24 hours you can use MyChart to ask questions about today's visit, request a non-urgent call back, or ask for a work or school excuse. You will get an email in the next two days asking about your experience. I hope that your e-visit has been valuable and will speed your recovery.

## 2023-05-02 ENCOUNTER — Telehealth: Payer: Medicaid Other | Admitting: Family Medicine

## 2023-05-02 DIAGNOSIS — R6889 Other general symptoms and signs: Secondary | ICD-10-CM | POA: Diagnosis not present

## 2023-05-02 MED ORDER — OSELTAMIVIR PHOSPHATE 75 MG PO CAPS: 75.0000 mg | ORAL_CAPSULE | Freq: Two times a day (BID) | ORAL | 0 refills | Status: AC

## 2023-05-02 NOTE — Patient Instructions (Signed)
Leslie [name], thank you for joining Freddy Finner, NP for today's virtual visit.  While this provider is not your primary care provider (PCP), if your PCP is located in our provider database this encounter information will be shared with them immediately following your visit.   A [facility] MyChart account gives you access to today's visit and all your visits, tests, and labs performed at [facility] " click here if you don't have a [facility] MyChart account or go to [email or web address]  Consent: (Patient) Leslie [name] provided verbal consent for this virtual visit at the beginning of the encounter.  Current Medications:  Current Outpatient Medications:    oseltamivir (TAMIFLU) 75 MG capsule, Take 1 capsule (75 mg total) by mouth 2 (two) times daily for 5 days., Disp: 10 capsule, Rfl: 0   EPINEPHrine 0.3 mg/0.3 mL IJ SOAJ injection, Inject 0.3 mg into the muscle as needed for anaphylaxis., Disp: 2 each, Rfl: 0   fluticasone (FLONASE) 50 MCG/ACT nasal spray, Place 2 sprays into both nostrils daily. (Patient not taking: Reported on 06/24/2021), Disp: 16 g, Rfl: 6   Insulin Pen Needle 32G X 4 MM MISC, Use as directed with Saxenda, Disp: 100 each, Rfl: 0   lidocaine (XYLOCAINE) 2 % solution, Use as directed 15 mLs in the mouth or throat as needed for mouth pain., Disp: 100 mL, Rfl: 0   meclizine (ANTIVERT) 25 MG tablet, Take 1 tablet (25 mg total) by mouth 3 (three) times daily as needed for dizziness., Disp: 30 tablet, Rfl: 0   naproxen sodium (ALEVE) 220 MG tablet, Take 880 mg by mouth daily as needed (pain)., Disp: , Rfl:    nitrofurantoin, macrocrystal-monohydrate, (MACROBID) 100 MG capsule, Take 1 capsule (100 mg total) by mouth 2 (two) times daily., Disp: 10 capsule, Rfl: 0   Semaglutide-Weight Management (WEGOVY) 1.7 MG/0.75ML SOAJ, Inject 1.7 mg into the skin once a week for 28 days., Disp: 3 mL, Rfl: 0   Semaglutide-Weight Management (WEGOVY) 2.4 MG/0.75ML SOAJ, Inject  2.4 mg into the skin once a week., Disp: 9 mL, Rfl: 0   Medications ordered in this encounter:  Meds ordered this encounter  Medications   oseltamivir (TAMIFLU) 75 MG capsule    Sig: Take 1 capsule (75 mg total) by mouth 2 (two) times daily for 5 days.    Dispense:  10 capsule    Refill:  0    Supervising Provider:   Merrilee Jansky [[id number]]     *If you need refills on other medications prior to your next appointment, please contact your pharmacy*  Follow-Up: Call back or seek an in-person evaluation if the symptoms worsen or if the condition fails to improve as anticipated.  [facility] [phone]  Other Instructions   - Continue OTC symptomatic management of choice  - Take prescribed medications as directed - Push fluids - Rest as needed - Discussed return precautions and when to seek in-person evaluation,   If you have been instructed to have an in-person evaluation today at a local Urgent Care facility, please use the link below. It will take you to a list of all of our available [facility], including address, phone number and hours of operation. Please do not delay care.  [facility]  If you or a family member do not have a primary care provider, use the link below to schedule a visit and establish care. When you choose a [facility] primary care physician or  advanced practice provider, you gain a long-term partner in health. Find a Primary Care Provider  Learn more about [facility]'s in-office and virtual care options: [facility] - Get Care Now

## 2023-05-02 NOTE — Progress Notes (Signed)
Virtual Visit Consent   Leslie [name], you are scheduled for a virtual visit with a [facility] provider today. Just as with appointments in the office, your consent must be obtained to participate. Your consent will be active for this visit and any virtual visit you may have with one of our providers in the next 365 days. If you have a MyChart account, a copy of this consent can be sent to you electronically.  As this is a virtual visit, video technology does not allow for your provider to perform a traditional examination. This may limit your provider's ability to fully assess your condition. If your provider identifies any concerns that need to be evaluated in person or the need to arrange testing (such as labs, EKG, etc.), we will make arrangements to do so. Although advances in technology are sophisticated, we cannot ensure that it will always work on either your end or our end. If the connection with a video visit is poor, the visit may have to be switched to a telephone visit. With either a video or telephone visit, we are not always able to ensure that we have a secure connection.  By engaging in this virtual visit, you consent to the provision of healthcare and authorize for your insurance to be billed (if applicable) for the services provided during this visit. Depending on your insurance coverage, you may receive a charge related to this service.  I need to obtain your verbal consent now. Are you willing to proceed with your visit today? [name] has provided verbal consent on 05/02/2023 for a virtual visit (video or telephone). Freddy Finner, NP  Date: 05/02/2023 3:43 PM  Virtual Visit via Video Note   I, Freddy Finner, connected with  Leslie [name]  ([id number], 1996/02/17) on 05/02/23 at  3:45 PM EST by a video-enabled telemedicine application and verified that I am speaking with the correct person using two identifiers.  Location: Patient: Virtual Visit Location Patient:  Home Provider: Virtual Visit Location Provider: Home Office   I discussed the limitations of evaluation and management by telemedicine and the availability of in person appointments. The patient expressed understanding and agreed to proceed.    History of Present Illness: Leslie [name] is a 28 y.o. who identifies as a female who was assigned female at birth, and is being seen today for Flu A positive in home .  Onset was Tuesday night- body aches Associated symptoms are runny nose, weakness, foggy headed, sore through, tired, cough, headache, fever 101.5, chills  Modifying factors are mucinex, EX tylenol  Denies chest pain, shortness of breath, fevers, chills  Exposure to sick contacts- known COVID test: no  Vaccines: Not UTD    Problems:  Patient Active Problem List   Diagnosis Date Noted   Anemia due to acute blood loss 10/11/2019   Postpartum care following cesarean delivery 7/2 10/10/2019   Maternal anemia, with delivery, superimposed on IDA 10/10/2019   BMI 45.0-49.9, adult (HCC) 10/09/2019   Hx of postpartum depression, currently pregnant 10/09/2019   Gestational hypertension 10/09/2019   R C/S - failed TOLAC 10/09/2019   Pure hypercholesterolemia 06/12/2016    Allergies:  Allergies  Allergen Reactions   Amoxicillin    Medications:  Current Outpatient Medications:    EPINEPHrine 0.3 mg/0.3 mL IJ SOAJ injection, Inject 0.3 mg into the muscle as needed for anaphylaxis., Disp: 2 each, Rfl: 0   fluticasone (FLONASE) 50 MCG/ACT nasal spray, Place 2 sprays into both nostrils daily. (Patient not  taking: Reported on 06/24/2021), Disp: 16 g, Rfl: 6   Insulin Pen Needle 32G X 4 MM MISC, Use as directed with Saxenda, Disp: 100 each, Rfl: 0   lidocaine (XYLOCAINE) 2 % solution, Use as directed 15 mLs in the mouth or throat as needed for mouth pain., Disp: 100 mL, Rfl: 0   meclizine (ANTIVERT) 25 MG tablet, Take 1 tablet (25 mg total) by mouth 3 (three) times daily as needed for  dizziness., Disp: 30 tablet, Rfl: 0   naproxen sodium (ALEVE) 220 MG tablet, Take 880 mg by mouth daily as needed (pain)., Disp: , Rfl:    nitrofurantoin, macrocrystal-monohydrate, (MACROBID) 100 MG capsule, Take 1 capsule (100 mg total) by mouth 2 (two) times daily., Disp: 10 capsule, Rfl: 0   Semaglutide-Weight Management (WEGOVY) 1.7 MG/0.75ML SOAJ, Inject 1.7 mg into the skin once a week for 28 days., Disp: 3 mL, Rfl: 0   Semaglutide-Weight Management (WEGOVY) 2.4 MG/0.75ML SOAJ, Inject 2.4 mg into the skin once a week., Disp: 9 mL, Rfl: 0  Observations/Objective: Patient is well-developed, well-nourished in no acute distress.  Resting comfortably  at home.  Head is normocephalic, atraumatic.  No labored breathing.  Speech is clear and coherent with logical content.  Patient is alert and oriented at baseline.    Assessment and Plan:  1. Flu-like symptoms (Primary)  - oseltamivir (TAMIFLU) 75 MG capsule; Take 1 capsule (75 mg total) by mouth 2 (two) times daily for 5 days.  Dispense: 10 capsule; Refill: 0  - Continue OTC symptomatic management of choice  - Take prescribed medications as directed - Push fluids - Rest as needed - Discussed return precautions and when to seek in-person evaluation, sent via AVS as well  Reviewed side effects, risks and benefits of medication.    Patient acknowledged agreement and understanding of the plan.   Past Medical, Surgical, Social History, Allergies, and Medications have been Reviewed.   Follow Up Instructions: I discussed the assessment and treatment plan with the patient. The patient was provided an opportunity to ask questions and all were answered. The patient agreed with the plan and demonstrated an understanding of the instructions.  A copy of instructions were sent to the patient via MyChart unless otherwise noted below.    The patient was advised to call back or seek an in-person evaluation if the symptoms worsen or if the  condition fails to improve as anticipated.    Freddy Finner, NP

## 2023-05-05 ENCOUNTER — Telehealth: Payer: Medicaid Other | Admitting: Family

## 2023-05-05 DIAGNOSIS — R112 Nausea with vomiting, unspecified: Secondary | ICD-10-CM | POA: Diagnosis not present

## 2023-05-05 MED ORDER — ONDANSETRON HCL 4 MG PO TABS: 4.0000 mg | ORAL_TABLET | Freq: Three times a day (TID) | ORAL | 0 refills | Status: DC | PRN

## 2023-05-05 NOTE — Progress Notes (Signed)
E-Visit for Vomiting  We are sorry that you are not feeling well. Here is how we plan to help!  Based on what you have shared with me it looks like you have a Virus that is irritating your GI tract.  Vomiting is the forceful emptying of a portion of the stomach's content through the mouth.  Although nausea and vomiting can make you feel miserable, it's important to remember that these are not diseases, but rather symptoms of an underlying illness.  When we treat short term symptoms, we always caution that any symptoms that persist should be fully evaluated in a medical office.  I have prescribed a medication that will help alleviate your symptoms and allow you to stay hydrated:  Zofran 4 mg 1 tablet every 8 hours as needed for nausea and vomiting  HOME CARE: Drink clear liquids.  This is very important! Dehydration (the lack of fluid) can lead to a serious complication.  Start off with 1 tablespoon every 5 minutes for 8 hours. You may begin eating bland foods after 8 hours without vomiting.  Start with saltine crackers, white bread, rice, mashed potatoes, applesauce. After 48 hours on a bland diet, you may resume a normal diet. Try to go to sleep.  Sleep often empties the stomach and relieves the need to vomit.  GET HELP RIGHT AWAY IF:  Your symptoms do not improve or worsen within 2 days after treatment. You have a fever for over 3 days. You cannot keep down fluids after trying the medication.  MAKE SURE YOU:  Understand these instructions. Will watch your condition. Will get help right away if you are not doing well or get worse.   Thank you for choosing an e-visit.  Your e-visit answers were reviewed by a board certified advanced clinical practitioner to complete your personal care plan. Depending upon the condition, your plan could have included both over the counter or prescription medications.  Please review your pharmacy choice. Make sure the pharmacy is open so you can pick  up prescription now. If there is a problem, you may contact your provider through [employer] and have the prescription routed to another pharmacy.  Your safety is important to [place]. If you have drug allergies check your prescription carefully.   For the next 24 hours you can use MyChart to ask questions about today's visit, request a non-urgent call back, or ask for a work or school excuse. You will get an email in the next two days asking about your experience. I hope that your e-visit has been valuable and will speed your recovery.   Approximately 5 minutes was spent documenting and reviewing patient's chart.

## 2023-10-01 ENCOUNTER — Telehealth: Admitting: Physician Assistant

## 2023-10-01 DIAGNOSIS — R3989 Other symptoms and signs involving the genitourinary system: Secondary | ICD-10-CM

## 2023-10-01 MED ORDER — NITROFURANTOIN MONOHYD MACRO 100 MG PO CAPS: 100.0000 mg | ORAL_CAPSULE | Freq: Two times a day (BID) | ORAL | 0 refills | Status: DC

## 2023-10-01 NOTE — Progress Notes (Signed)
 I have spent 5 minutes in review of e-visit questionnaire, review and updating patient chart, medical decision making and response to patient.   Piedad Climes, PA-C

## 2023-10-01 NOTE — Progress Notes (Signed)

## 2024-02-01 IMAGING — CT CT NECK W/ CM
4 series · 15 of 33 positions shown, 18 images · IV contrast (APPLIED)
Comparison: 04/27/2018

CLINICAL DATA: Soft tissue swelling.  Difficulty breathing.

EXAM:
CT NECK WITH CONTRAST
TECHNIQUE: Multidetector CT imaging of the neck was performed using the
standard protocol following the bolus administration of intravenous
contrast.

[Series 3: neck 2.0 i31s 3 · axial · 0.58mm/px · z∈[-356,-208]mm · 5 of 112 slices shown, 7 images]
[im 19/112  soft-tissue]
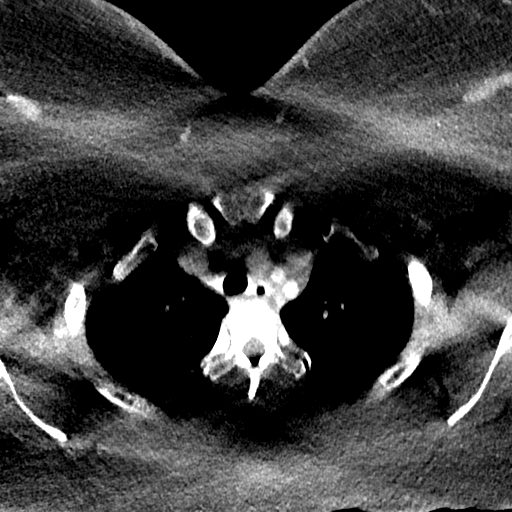
[im 19/112  bone]
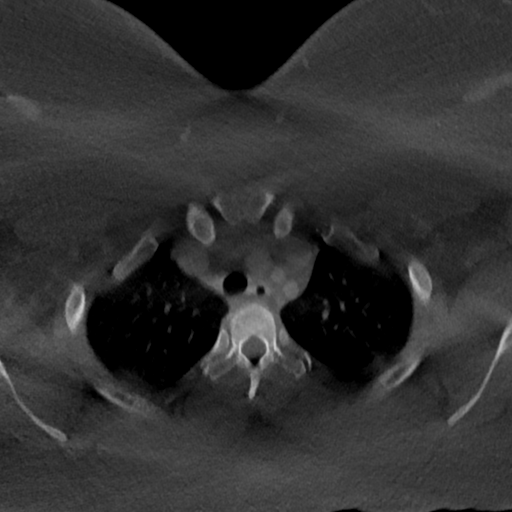
[im 38/112  bone]
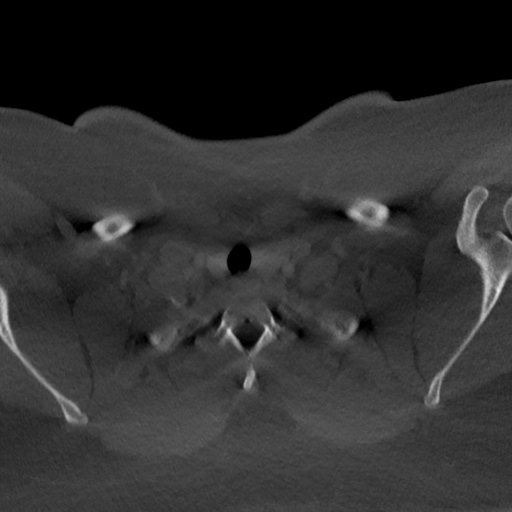
[im 56/112  bone]
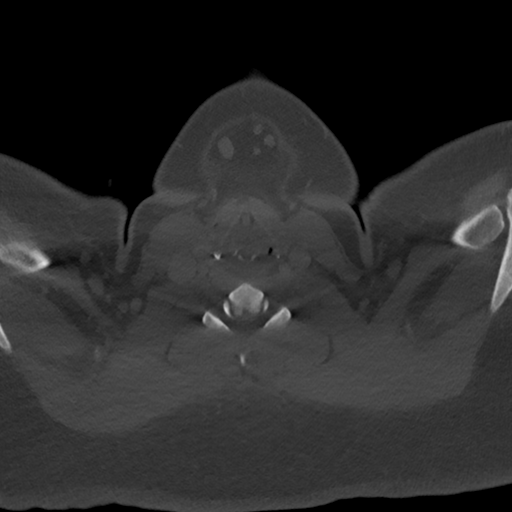
[im 75/112  bone]
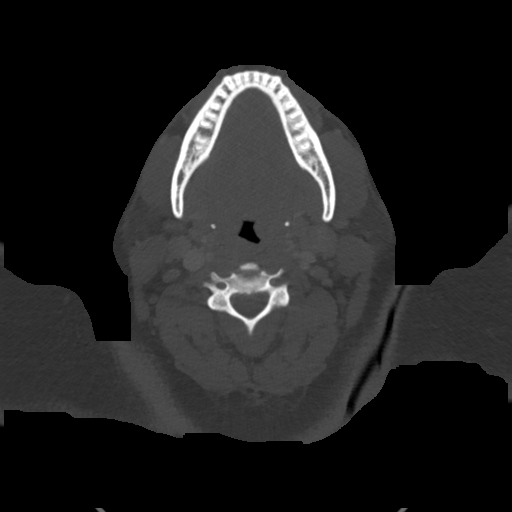
[im 93/112  soft-tissue]
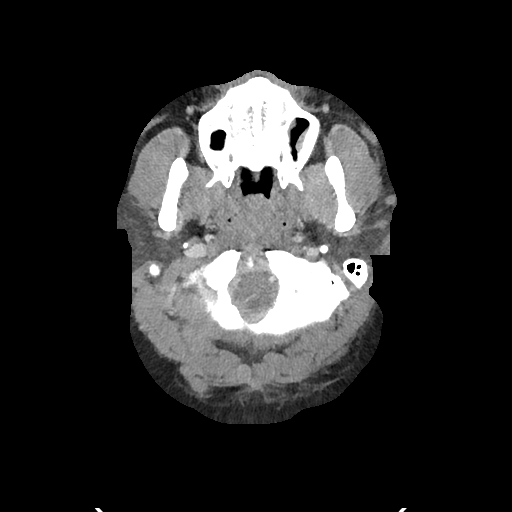
[im 93/112  bone]
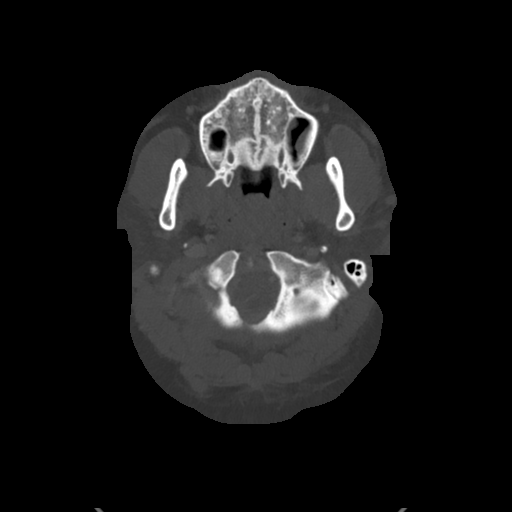

[Series 6: coronal st · coronal · 0.47mm/px · 3 of 129 slices shown]
[im 26/129  bone]
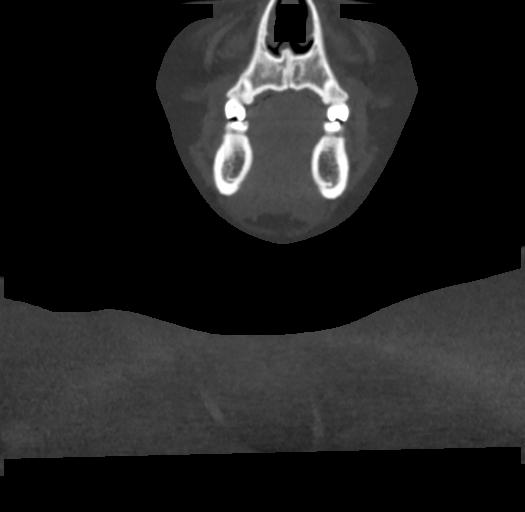
[im 52/129  bone]
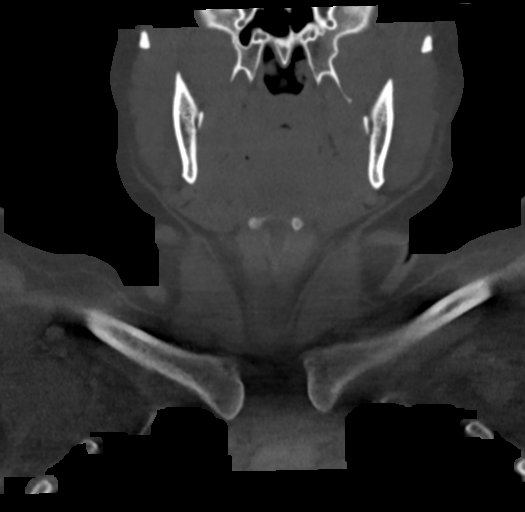
[im 77/129  bone]
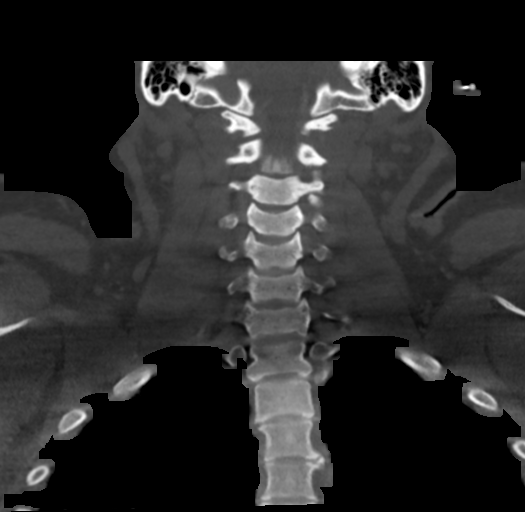

[Series 7: sagittal st · sagittal · 0.47mm/px · 5 of 112 slices shown, 6 images]
[im 38/112  bone]
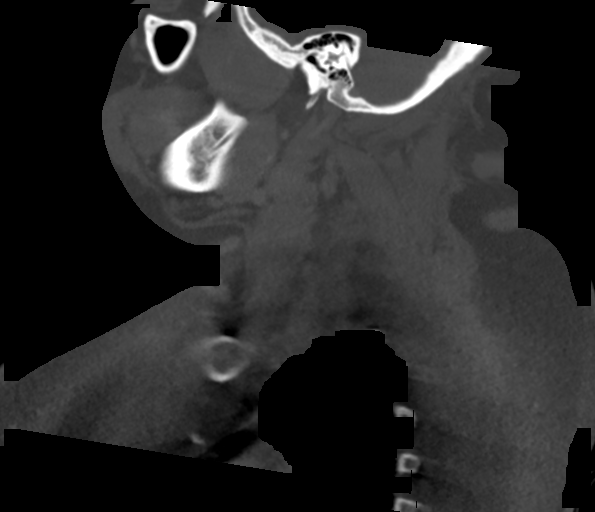
[im 47/112  bone]
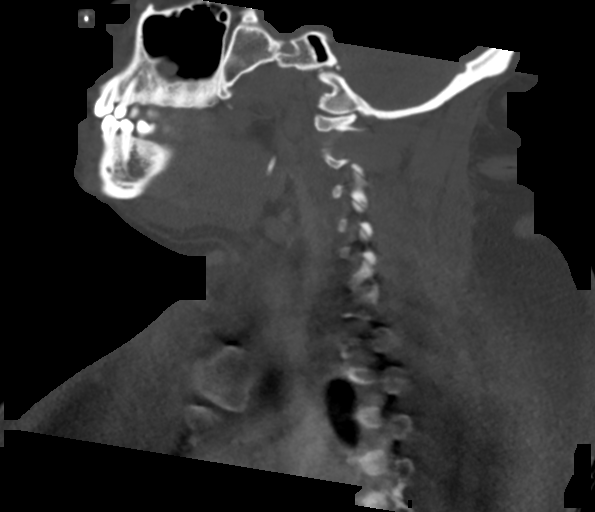
[im 56/112  soft-tissue]
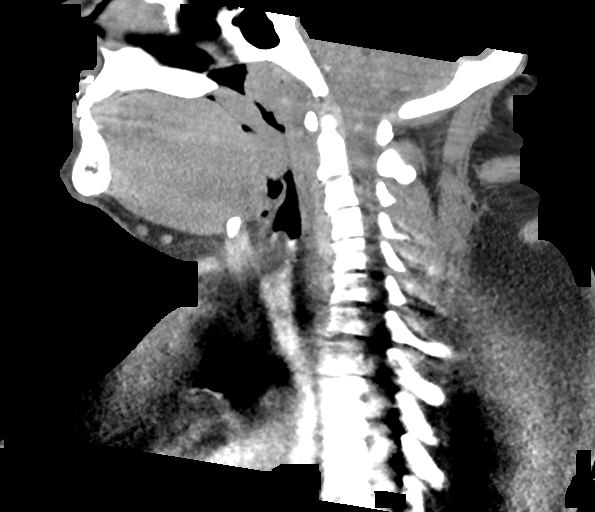
[im 56/112  bone]
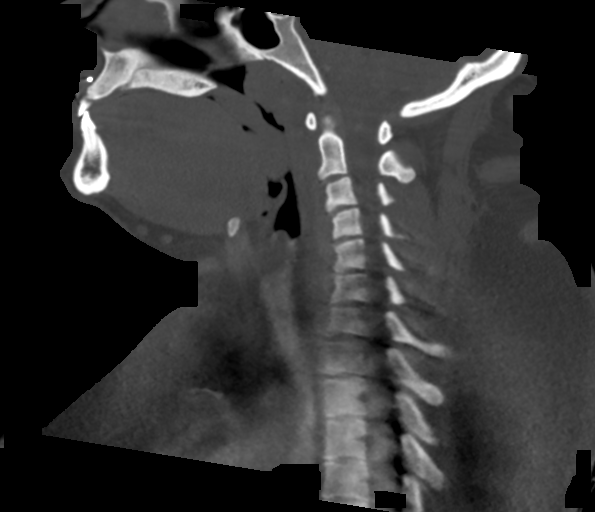
[im 65/112  bone]
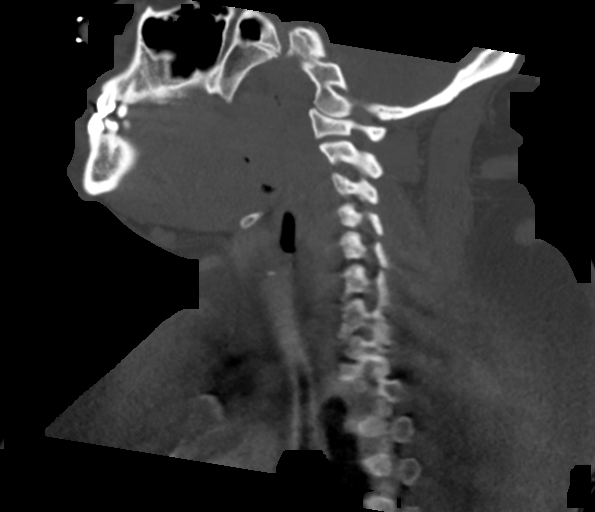
[im 75/112  bone]
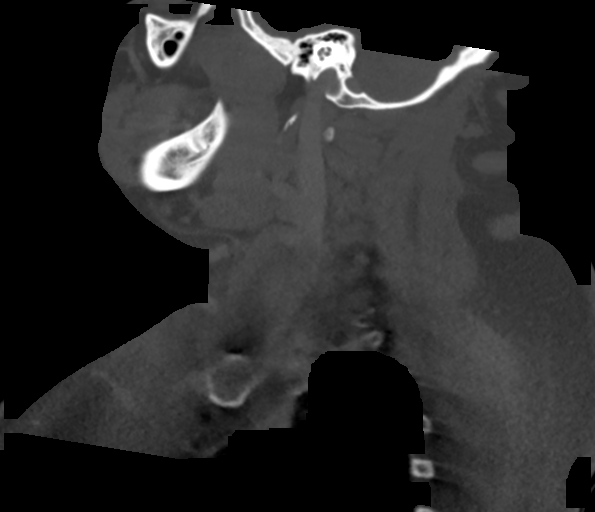

[Series 8: orthogonal st · axial · 0.48mm/px · z∈[-379,-339]mm · 2 of 122 slices shown]
[im 21/122  bone]
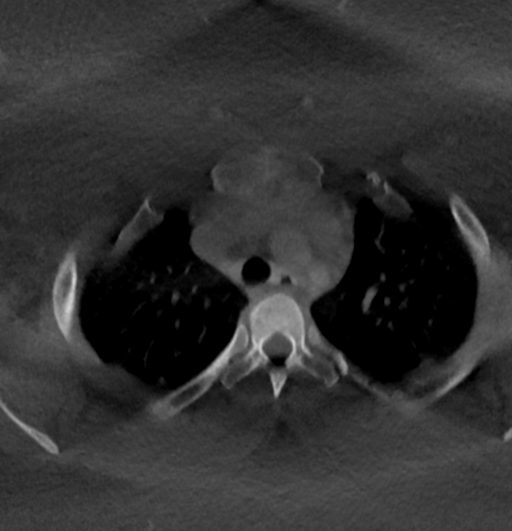
[im 41/122  bone]
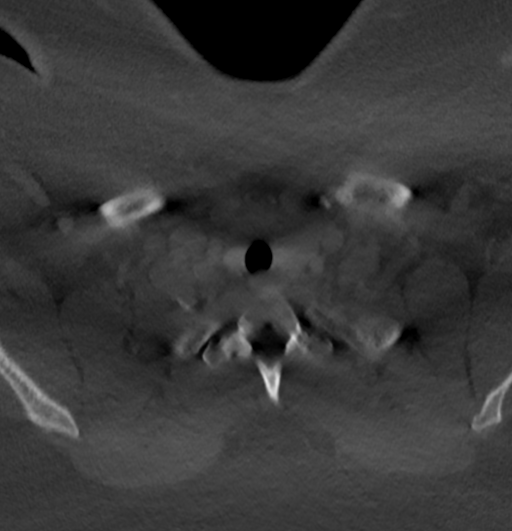

[15 of 33 positions shown; findings below may reference images not displayed]

RADIATION DOSE REDUCTION: This exam was performed according to the
departmental dose-optimization program which includes automated
exposure control, adjustment of the mA and/or kV according to
patient size and/or use of iterative reconstruction technique.

CONTRAST:  75mL OMNIPAQUE IOHEXOL 300 MG/ML  SOLN
FINDINGS: PHARYNX AND LARYNX: The nasopharynx, oropharynx and larynx are
normal. Visible portions of the oral cavity, tongue base and floor
of mouth are normal. Normal epiglottis, vallecula and pyriform
sinuses. The larynx is normal. No retropharyngeal abscess, effusion
or lymphadenopathy.

SALIVARY GLANDS: Normal parotid, submandibular and sublingual
glands.

THYROID: Normal.

LYMPH NODES: No enlarged or abnormal density lymph nodes.

VASCULAR: Major cervical vessels are patent.

LIMITED INTRACRANIAL: Normal.

VISUALIZED ORBITS: Normal.

MASTOIDS AND VISUALIZED PARANASAL SINUSES: No fluid levels or
advanced mucosal thickening. No mastoid effusion.

SKELETON: No bony spinal canal stenosis. No lytic or blastic
lesions.

UPPER CHEST: Clear.

OTHER: None.
IMPRESSION: Normal CT of the neck.

## 2024-03-05 IMAGING — CT CT NECK W/ CM
3 of 4 series · 13 of 33 positions shown, 16 images · IV contrast (Omnipaque)
Comparison: Neck CT 05/22/2021 and earlier.

CLINICAL DATA: 26-year-old female with sore throat and dysphagia.

EXAM:
CT NECK WITH CONTRAST
TECHNIQUE: Multidetector CT imaging of the neck was performed using the
standard protocol following the bolus administration of intravenous
contrast.

[Series 3: axial neck · axial · 0.54mm/px · z∈[-716,-570]mm · 5 of 111 slices shown, 7 images]
[im 19/111  soft-tissue]
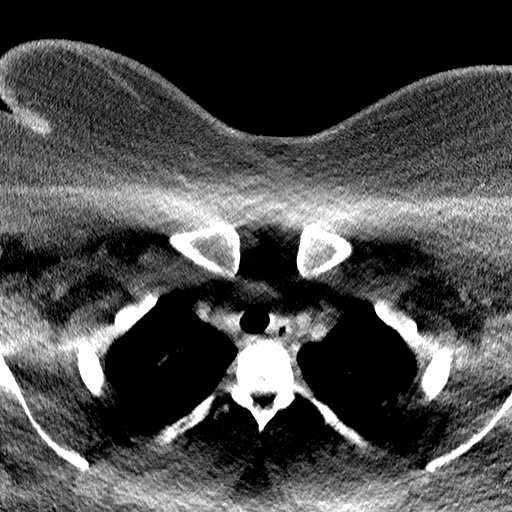
[im 19/111  bone]
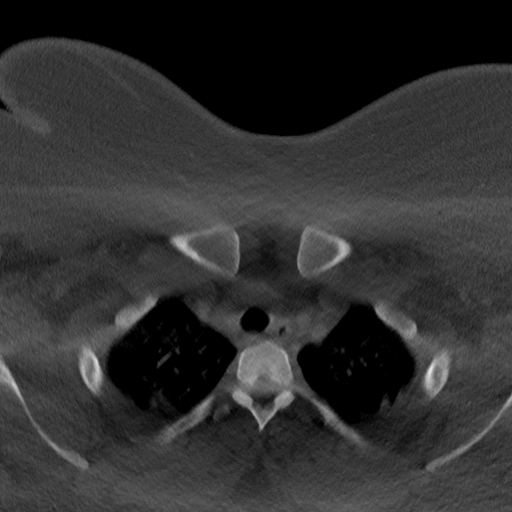
[im 37/111  bone]
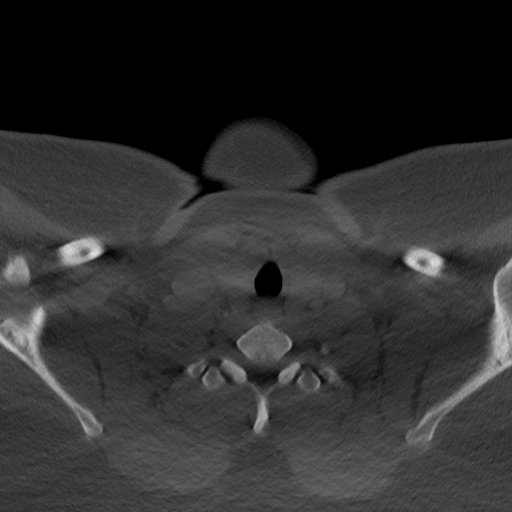
[im 56/111  bone]
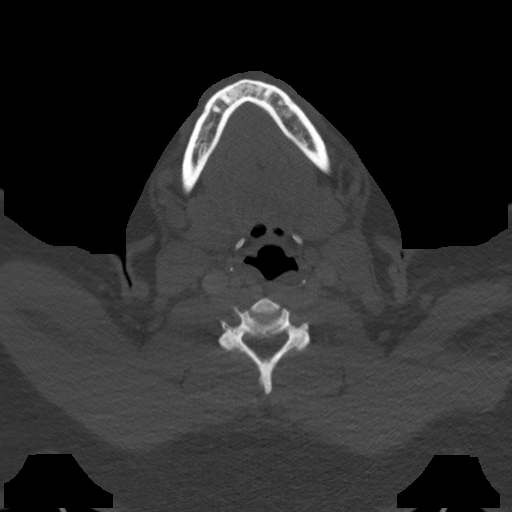
[im 74/111  bone]
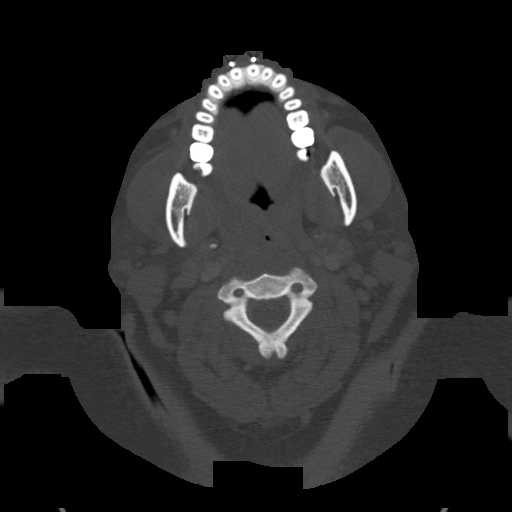
[im 92/111  soft-tissue]
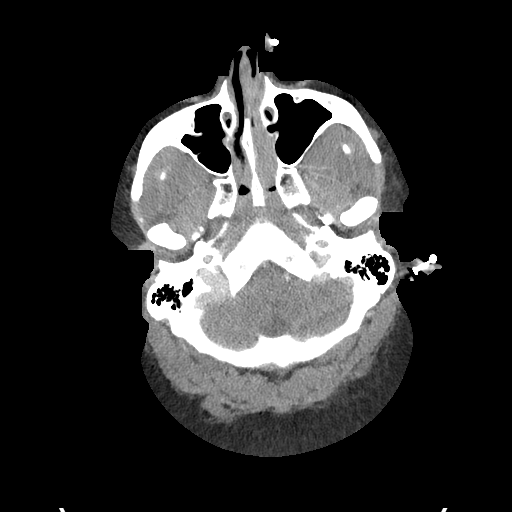
[im 92/111  bone]
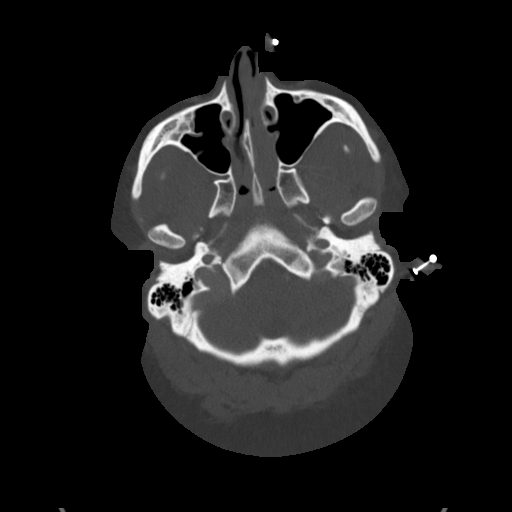

[Series 5: sag neck · sagittal · 0.44mm/px · 5 of 103 slices shown, 6 images]
[im 35/103  bone]
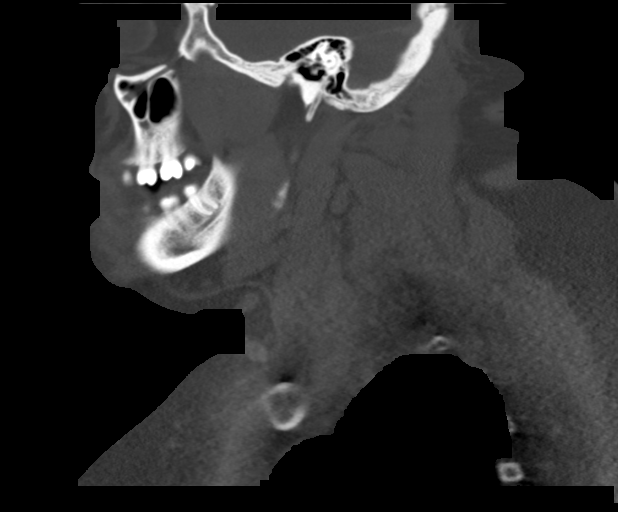
[im 43/103  bone]
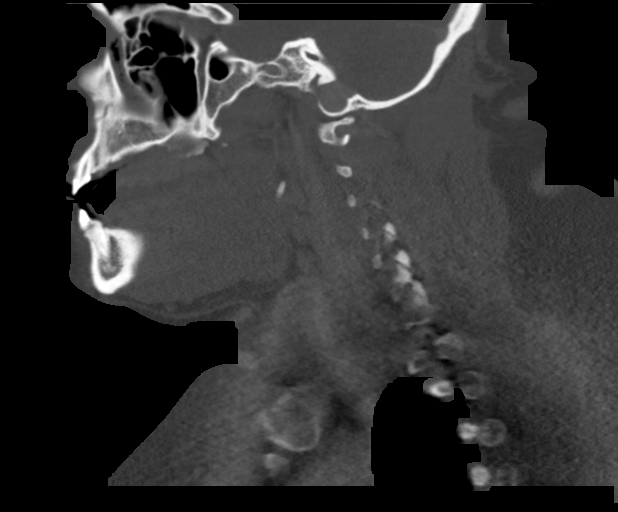
[im 52/103  soft-tissue]
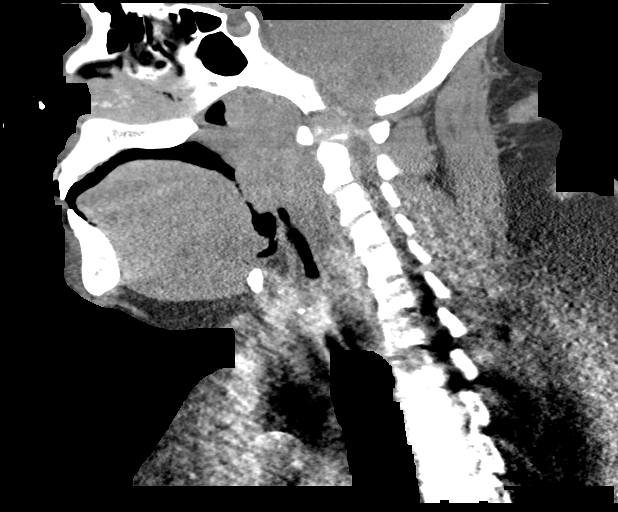
[im 52/103  bone]
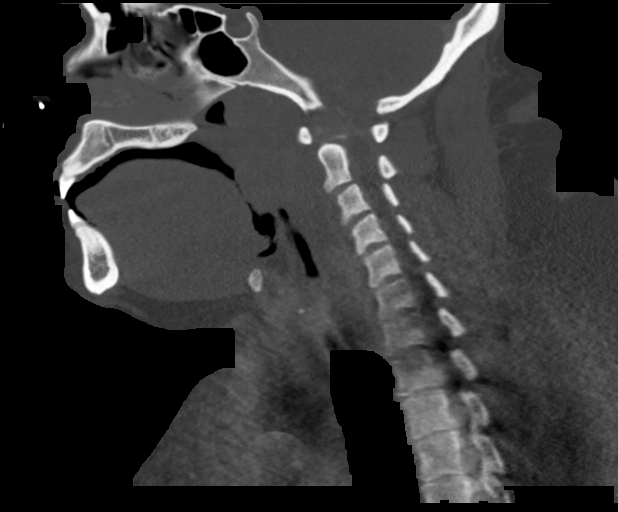
[im 60/103  bone]
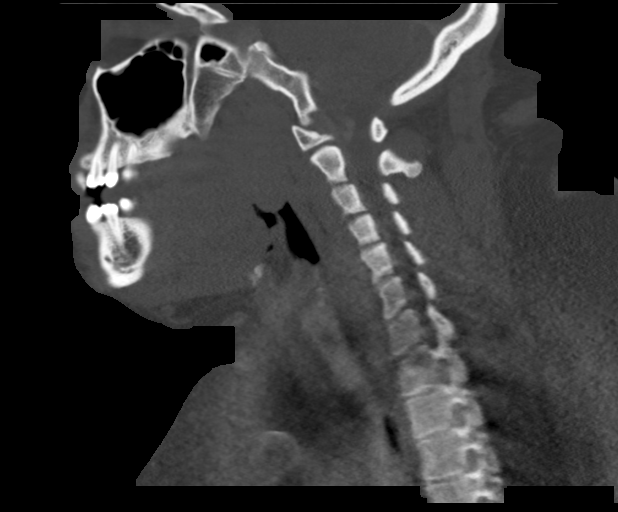
[im 69/103  bone]
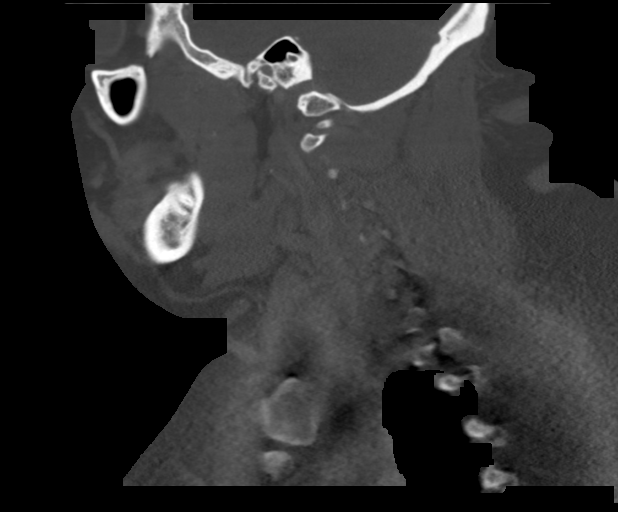

[Series 6: cor neck · coronal · 0.41mm/px · 3 of 133 slices shown]
[im 27/133  bone]
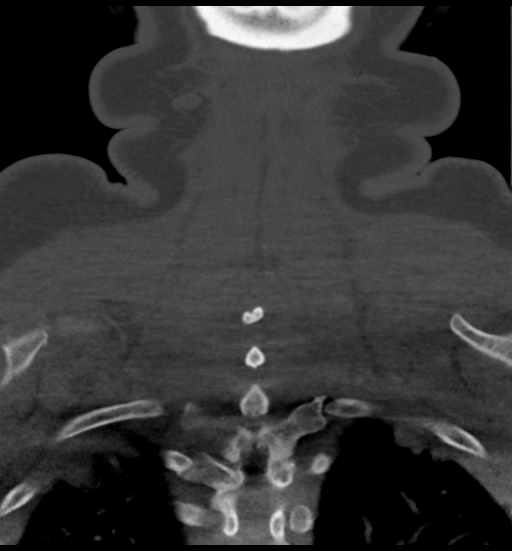
[im 53/133  bone]
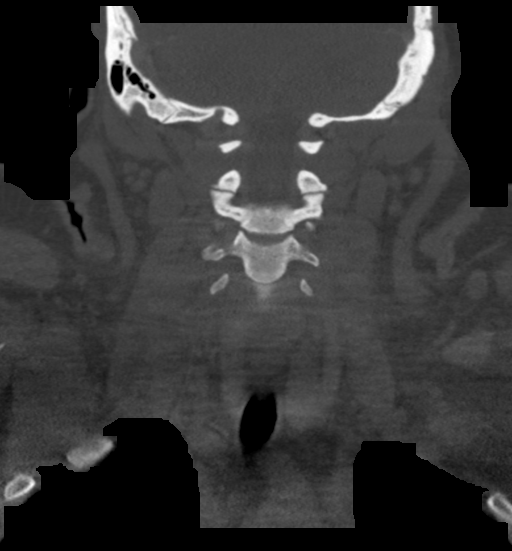
[im 80/133  bone]
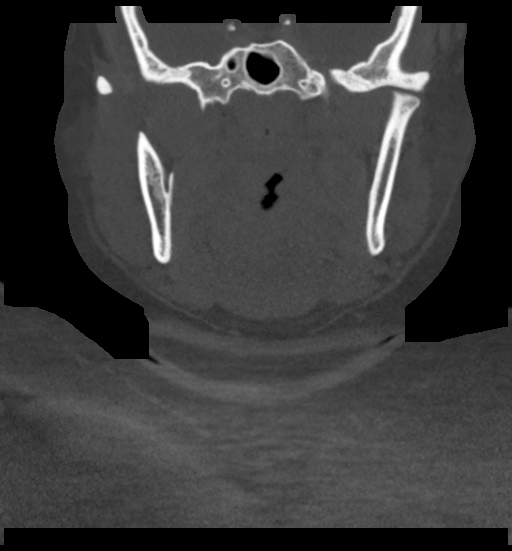

[13 of 33 positions shown; findings below may reference images not displayed]

RADIATION DOSE REDUCTION: This exam was performed according to the
departmental dose-optimization program which includes automated
exposure control, adjustment of the mA and/or kV according to
patient size and/or use of iterative reconstruction technique.

CONTRAST:  75mL OMNIPAQUE IOHEXOL 300 MG/ML  SOLN
FINDINGS: Pharynx and larynx: Larynx and epiglottis are stable since last
month and within normal limits.

New asymmetric enlargement and heterogeneity of the right palatine
tonsil (series 3, image 37) with an ill-defined oval hypodense area
within that tonsil 18 by 10 x 14 mm (AP by transverse by CC)
consistent with developing tonsillar abscess. Only subtle
inflammatory stranding in the right parapharyngeal space at this
time. Left parapharyngeal and retropharyngeal spaces remain
negative.

Superimposed generalized tonsillar and adenoid hypertrophy otherwise
similar to last month.

Salivary glands: Stable and negative.

Thyroid: Stable and negative.

Lymph nodes: Increased, stable bilateral cervical lymph nodes,
bilateral level 2A nodes are chronically at the upper limits of
normal to mildly enlarged (series 3 images 39 and 43). No cystic or
necrotic nodes.

Vascular: Suboptimal intravascular contrast bolus but the major
vascular structures in the neck including the right IJ appear to
remain patent. There is a partially retropharyngeal course of the
right carotid artery (series 3, image 51).

Limited intracranial: Negative.

Visualized orbits: Negative.

Mastoids and visualized paranasal sinuses: Visualized paranasal
sinuses and mastoids are stable and well aerated. Tympanic cavities
are clear.

Skeleton: No acute dental finding. Chronically elongated bilateral
stylohyoid ligament calcification. Negative cervical spine. No acute
osseous abnormality identified.

Upper chest: Negative.
IMPRESSION: 1. Positive for Tonsillitis with ill-defined developing Right
Tonsillar Abscess measuring up to 1.8 cm (series 3, image 37).
Note a partially retropharyngeal course of the right carotid.
Minimal inflammation in the right parapharyngeal space. Underlying
chronic generalized tonsillar and adenoid enlargement.

2. Reactive appearing bilateral cervical lymph nodes are unchanged
from last month.

3. Chronically elongated bilateral stylohyoid ligament
calcification, which can predispose to Akpojotor Syndrome.

## 2024-03-11 ENCOUNTER — Telehealth: Admitting: Physician Assistant

## 2024-03-11 DIAGNOSIS — B3731 Acute candidiasis of vulva and vagina: Secondary | ICD-10-CM

## 2024-03-11 MED ORDER — FLUCONAZOLE 150 MG PO TABS: ORAL_TABLET | ORAL | 0 refills | Status: DC

## 2024-03-11 NOTE — Progress Notes (Signed)

## 2024-04-03 ENCOUNTER — Telehealth: Admitting: Family Medicine

## 2024-04-03 DIAGNOSIS — A084 Viral intestinal infection, unspecified: Secondary | ICD-10-CM

## 2024-04-03 DIAGNOSIS — R112 Nausea with vomiting, unspecified: Secondary | ICD-10-CM

## 2024-04-03 MED ORDER — ONDANSETRON HCL 4 MG PO TABS: 4.0000 mg | ORAL_TABLET | Freq: Three times a day (TID) | ORAL | 0 refills | Status: AC | PRN

## 2024-04-03 NOTE — Progress Notes (Signed)
 " Virtual Visit Consent   Leslie [name], you are scheduled for a virtual visit with a [facility] provider today. Just as with appointments in the office, your consent must be obtained to participate. Your consent will be active for this visit and any virtual visit you may have with one of our providers in the next 365 days. If you have a MyChart account, a copy of this consent can be sent to you electronically.  As this is a virtual visit, video technology does not allow for your provider to perform a traditional examination. This may limit your provider's ability to fully assess your condition. If your provider identifies any concerns that need to be evaluated in person or the need to arrange testing (such as labs, EKG, etc.), we will make arrangements to do so. Although advances in technology are sophisticated, we cannot ensure that it will always work on either your end or our end. If the connection with a video visit is poor, the visit may have to be switched to a telephone visit. With either a video or telephone visit, we are not always able to ensure that we have a secure connection.  By engaging in this virtual visit, you consent to the provision of healthcare and authorize for your insurance to be billed (if applicable) for the services provided during this visit. Depending on your insurance coverage, you may receive a charge related to this service.  I need to obtain your verbal consent now. Are you willing to proceed with your visit today? Trenton Fredericks has provided verbal consent on 04/03/2024 for a virtual visit (video or telephone). Loa Lamp, FNP  Date: 04/03/2024 1:38 PM   Virtual Visit via Video Note   I, Loa Lamp, connected with  Leslie [name]  ([id number], Jun 24, 1995) on 04/03/2024 at  1:30 PM EST by a video-enabled telemedicine application and verified that I am speaking with the correct person using two identifiers.  Location: Patient: Virtual Visit Location Patient:  Home Provider: Virtual Visit Location Provider: Home Office   I discussed the limitations of evaluation and management by telemedicine and the availability of in person appointments. The patient expressed understanding and agreed to proceed.    History of Present Illness: Leslie [name] is a 28 y.o. who identifies as a female who was assigned female at birth, and is being seen today for nausea, vomiting and diarrhea starting at 2 am today. Exposed to norovirus 2 days ago. Fever also. SABRA  HPI: HPI  Problems:  Patient Active Problem List   Diagnosis Date Noted   Anemia due to acute blood loss 10/11/2019   Postpartum care following cesarean delivery 7/2 10/10/2019   Maternal anemia, with delivery, superimposed on IDA 10/10/2019   BMI 45.0-49.9, adult (HCC) 10/09/2019   Hx of postpartum depression, currently pregnant 10/09/2019   Gestational hypertension 10/09/2019   R C/S - failed TOLAC 10/09/2019   Pure hypercholesterolemia 06/12/2016    Allergies: Allergies[1] Medications: Current Medications[2]  Observations/Objective: Patient is well-developed, well-nourished in no acute distress.  Resting comfortably  at home.  Head is normocephalic, atraumatic.  No labored breathing.  Speech is clear and coherent with logical content.  Patient is alert and oriented at baseline.    Assessment and Plan: 1. Viral gastroenteritis (Primary)  Increase fluids, no milk or dairy UC if sx worsen.   Follow Up Instructions: I discussed the assessment and treatment plan with the patient. The patient was provided an opportunity to ask questions and all were answered. The patient  agreed with the plan and demonstrated an understanding of the instructions.  A copy of instructions were sent to the patient via MyChart unless otherwise noted below.     The patient was advised to call back or seek an in-person evaluation if the symptoms worsen or if the condition fails to improve as anticipated.    Leslie Ohlinger, FNP     [1]  Allergies Allergen Reactions   Amoxicillin    [2]  Current Outpatient Medications:    EPINEPHrine  0.3 mg/0.3 mL IJ SOAJ injection, Inject 0.3 mg into the muscle as needed for anaphylaxis., Disp: 2 each, Rfl: 0   fluconazole  (DIFLUCAN ) 150 MG tablet, Take 1 tablet PO once. Repeat in 3 days if needed., Disp: 2 tablet, Rfl: 0   fluticasone  (FLONASE ) 50 MCG/ACT nasal spray, Place 2 sprays into both nostrils daily. (Patient not taking: Reported on 06/24/2021), Disp: 16 g, Rfl: 6   Insulin  Pen Needle 32G X 4 MM MISC, Use as directed with Saxenda , Disp: 100 each, Rfl: 0   lidocaine  (XYLOCAINE ) 2 % solution, Use as directed 15 mLs in the mouth or throat as needed for mouth pain., Disp: 100 mL, Rfl: 0   meclizine  (ANTIVERT ) 25 MG tablet, Take 1 tablet (25 mg total) by mouth 3 (three) times daily as needed for dizziness., Disp: 30 tablet, Rfl: 0   naproxen  sodium (ALEVE ) 220 MG tablet, Take 880 mg by mouth daily as needed (pain)., Disp: , Rfl:    nitrofurantoin , macrocrystal-monohydrate, (MACROBID ) 100 MG capsule, Take 1 capsule (100 mg total) by mouth 2 (two) times daily., Disp: 10 capsule, Rfl: 0   ondansetron  (ZOFRAN ) 4 MG tablet, Take 1 tablet (4 mg total) by mouth every 8 (eight) hours as needed for nausea or vomiting., Disp: 20 tablet, Rfl: 0   Semaglutide -Weight Management (WEGOVY ) 1.7 MG/0.75ML SOAJ, Inject 1.7 mg into the skin once a week for 28 days., Disp: 3 mL, Rfl: 0   Semaglutide -Weight Management (WEGOVY ) 2.4 MG/0.75ML SOAJ, Inject 2.4 mg into the skin once a week., Disp: 9 mL, Rfl: 0  "

## 2024-04-03 NOTE — Progress Notes (Signed)
" ° °  Thank you for the details you included in the comment boxes. Those details are very helpful in determining the best course of treatment for you and help us  to provide the best care.Because of the complexity of your symptoms, we recommend that you schedule a Virtual Urgent Care video visit in order for the provider to better assess what is going on.  The provider will be able to give you a more accurate diagnosis and treatment plan if we can more freely discuss your symptoms and with the addition of a virtual examination.   If you change your visit to a video visit, we will bill your insurance (similar to an office visit) and you will not be charged for this e-Visit. You will be able to stay at home and speak with the first available [facility] advanced practice provider. The link to do a video visit is in the drop down Menu tab of your Welcome screen in MyChart.     I have spent 5 minutes in review of e-visit questionnaire, review and updating patient chart, medical decision making and response to patient.   Roosvelt Mater, PA-C     "

## 2024-04-03 NOTE — Patient Instructions (Signed)

## 2024-04-05 ENCOUNTER — Telehealth

## 2024-04-05 DIAGNOSIS — A084 Viral intestinal infection, unspecified: Secondary | ICD-10-CM | POA: Diagnosis not present

## 2024-04-05 NOTE — Progress Notes (Signed)
 " Virtual Visit Consent   Leslie [name], you are scheduled for a virtual visit with a [facility] provider today. Just as with appointments in the office, your consent must be obtained to participate. Your consent will be active for this visit and any virtual visit you may have with one of our providers in the next 365 days. If you have a MyChart account, a copy of this consent can be sent to you electronically.  As this is a virtual visit, video technology does not allow for your provider to perform a traditional examination. This may limit your provider's ability to fully assess your condition. If your provider identifies any concerns that need to be evaluated in person or the need to arrange testing (such as labs, EKG, etc.), we will make arrangements to do so. Although advances in technology are sophisticated, we cannot ensure that it will always work on either your end or our end. If the connection with a video visit is poor, the visit may have to be switched to a telephone visit. With either a video or telephone visit, we are not always able to ensure that we have a secure connection.  By engaging in this virtual visit, you consent to the provision of healthcare and authorize for your insurance to be billed (if applicable) for the services provided during this visit. Depending on your insurance coverage, you may receive a charge related to this service.  I need to obtain your verbal consent now. Are you willing to proceed with your visit today? Leslie [name] has provided verbal consent on 04/05/2024 for a virtual visit (video or telephone). Bari Learn, FNP  Date: 04/05/2024 3:57 PM   Virtual Visit via Video Note   I, Bari Learn, connected with  Leslie [name]  ([id number], [birth year]) on 04/05/2024 at  4:00 PM EST by a video-enabled telemedicine application and verified that I am speaking with the correct person using two identifiers.  Location: Patient: Virtual Visit Location Patient:  Home Provider: Virtual Visit Location Provider: Home Office   I discussed the limitations of evaluation and management by telemedicine and the availability of in person appointments. The patient expressed understanding and agreed to proceed.    History of Present Illness: Leslie [name] is a 28 y.o. who identifies as a female who was assigned female at birth, and is being seen today for work note. She had vomiting and diarrhea that started two days ago. Her symptoms have improved now and would like to go back tomorrow. Denies any fever, but having some slightly fatigue.   HPI: HPI  Problems:  Patient Active Problem List   Diagnosis Date Noted   Anemia due to acute blood loss 10/11/2019   Postpartum care following cesarean delivery 7/2 10/10/2019   Maternal anemia, with delivery, superimposed on IDA 10/10/2019   BMI 45.0-49.9, adult (HCC) 10/09/2019   Hx of postpartum depression, currently pregnant 10/09/2019   Gestational hypertension 10/09/2019   R C/S - failed TOLAC 10/09/2019   Pure hypercholesterolemia 06/12/2016    Allergies: Allergies[1] Medications: Current Medications[2]  Observations/Objective: Patient is well-developed, well-nourished in no acute distress.  Resting comfortably at home.  Head is normocephalic, atraumatic.  No labored breathing. Speech is clear and coherent with logical content.  Patient is alert and oriented at baseline.    Assessment and Plan: 1. Viral gastroenteritis (Primary)  Continue zofran   Force fluids Bland diet  Work note given  Follow up if symptoms worsen or do not improve   Follow Up Instructions: I  discussed the assessment and treatment plan with the patient. The patient was provided an opportunity to ask questions and all were answered. The patient agreed with the plan and demonstrated an understanding of the instructions.  A copy of instructions were sent to the patient via MyChart unless otherwise noted below.     The patient was  advised to call back or seek an in-person evaluation if the symptoms worsen or if the condition fails to improve as anticipated.    Bari Learn, FNP    [1]  Allergies Allergen Reactions   Amoxicillin    [2]  Current Outpatient Medications:    EPINEPHrine  0.3 mg/0.3 mL IJ SOAJ injection, Inject 0.3 mg into the muscle as needed for anaphylaxis., Disp: 2 each, Rfl: 0   fluticasone  (FLONASE ) 50 MCG/ACT nasal spray, Place 2 sprays into both nostrils daily. (Patient not taking: Reported on 06/24/2021), Disp: 16 g, Rfl: 6   Insulin  Pen Needle 32G X 4 MM MISC, Use as directed with Saxenda , Disp: 100 each, Rfl: 0   meclizine  (ANTIVERT ) 25 MG tablet, Take 1 tablet (25 mg total) by mouth 3 (three) times daily as needed for dizziness., Disp: 30 tablet, Rfl: 0   naproxen  sodium (ALEVE ) 220 MG tablet, Take 880 mg by mouth daily as needed (pain)., Disp: , Rfl:    ondansetron  (ZOFRAN ) 4 MG tablet, Take 1 tablet (4 mg total) by mouth every 8 (eight) hours as needed for nausea or vomiting., Disp: 20 tablet, Rfl: 0   Semaglutide -Weight Management (WEGOVY ) 1.7 MG/0.75ML SOAJ, Inject 1.7 mg into the skin once a week for 28 days., Disp: 3 mL, Rfl: 0   Semaglutide -Weight Management (WEGOVY ) 2.4 MG/0.75ML SOAJ, Inject 2.4 mg into the skin once a week., Disp: 9 mL, Rfl: 0  "

## 2024-04-05 NOTE — Patient Instructions (Signed)

## 2024-04-14 ENCOUNTER — Encounter: Payer: Self-pay | Admitting: Family Medicine

## 2024-04-14 ENCOUNTER — Telehealth: Admitting: Family Medicine

## 2024-04-14 DIAGNOSIS — H10023 Other mucopurulent conjunctivitis, bilateral: Secondary | ICD-10-CM | POA: Diagnosis not present

## 2024-04-14 MED ORDER — POLYMYXIN B-TRIMETHOPRIM 10000-0.1 UNIT/ML-% OP SOLN: 1.0000 [drp] | Freq: Four times a day (QID) | OPHTHALMIC | 0 refills | Status: AC

## 2024-04-14 NOTE — Progress Notes (Signed)
 E-Visit for [employer]   We are sorry that you are not feeling well.  Here is how we plan to help!  Based on what you have shared with me it looks like you have conjunctivitis.  Conjunctivitis is a common inflammatory or infectious condition of the eye that is often referred to as pink eye.  In most cases it is contagious (viral or bacterial). However, not all conjunctivitis requires antibiotics (ex. Allergic).  We have made appropriate suggestions for you based upon your presentation.  I have prescribed Polytrim Ophthalmic drops 1-2 drops 4 times a day times 5 days  Pink eye can be highly contagious.  It is typically spread through direct contact with secretions, or contaminated objects or surfaces that one may have touched.  Strict handwashing is suggested with soap and water is urged.  If not available, use alcohol based had sanitizer.  Avoid unnecessary touching of the eye.  If you wear contact lenses, you will need to refrain from wearing them until you see no white discharge from the eye for at least 24 hours after being on medication.  You should see symptom improvement in 1-2 days after starting the medication regimen.  Call us  if symptoms are not improved in 1-2 days.  Home Care: Wash your hands often! Do not wear your contacts until you complete your treatment plan. Avoid sharing towels, bed linen, personal items with a person who has pink eye. See attention for anyone in your home with similar symptoms.  Get Help Right Away If: Your symptoms do not improve. You develop blurred or loss of vision. Your symptoms worsen (increased discharge, pain or redness)   Thank you for choosing an e-visit.  Your e-visit answers were reviewed by a board certified advanced clinical practitioner to complete your personal care plan. Depending upon the condition, your plan could have included both over the counter or prescription medications.  Please review your pharmacy choice. Make sure the  pharmacy is open so you can pick up prescription now. If there is a problem, you may contact your provider through [employer] and have the prescription routed to another pharmacy.  Your safety is important to us . If you have drug allergies check your prescription carefully.   For the next 24 hours you can use MyChart to ask questions about today's visit, request a non-urgent call back, or ask for a work or school excuse. You will get an email in the next two days asking about your experience. I hope that your e-visit has been valuable and will speed your recovery.  I have spent 5 minutes in review of e-visit questionnaire, review and updating patient chart, medical decision making and response to patient.   Chiquita CHRISTELLA Barefoot, NP
